# Patient Record
Sex: Male | Born: 1985 | Race: Black or African American | Hispanic: No | Marital: Single | Smoking: Current every day smoker
Health system: Southern US, Community
[De-identification: ages and names within clinical notes are randomized; demographics above are authoritative.]

## PROBLEM LIST (undated history)

## (undated) ENCOUNTER — Ambulatory Visit (HOSPITAL_COMMUNITY): Payer: Federal, State, Local not specified - Other

## (undated) DIAGNOSIS — K029 Dental caries, unspecified: Secondary | ICD-10-CM

## (undated) DIAGNOSIS — F209 Schizophrenia, unspecified: Secondary | ICD-10-CM

## (undated) NOTE — *Deleted (*Deleted)
Date:  05/08/2020 Time:  0900-1045 Group Topic/Focus: PROGRESSIVE RELAXATION. A group where deep breathing is taught and tensing and relaxation muscle groups is used. Imagery is used as well.  Pts are asked to imagine 3 pillars that hold them up when they are not able to hold themselves up.  Participation Level:  Active  Participation Quality:  Appropriate  Affect:  Appropriate  Cognitive:  Oriented  Insight: Improving  Engagement in Group:  Engaged  Modes of Intervention:  Activity, Discussion, Education, and Support  Additional Comments:  ***  Zayquan Bogard A 05/08/2020`  

---
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
## 2005-08-11 ENCOUNTER — Emergency Department (HOSPITAL_COMMUNITY): Admission: EM | Admit: 2005-08-11 | Discharge: 2005-08-11 | Payer: Self-pay | Admitting: Family Medicine

## 2005-08-14 ENCOUNTER — Emergency Department (HOSPITAL_COMMUNITY): Admission: EM | Admit: 2005-08-14 | Discharge: 2005-08-14 | Payer: Self-pay | Admitting: Family Medicine

## 2005-08-28 ENCOUNTER — Emergency Department (HOSPITAL_COMMUNITY): Admission: EM | Admit: 2005-08-28 | Discharge: 2005-08-28 | Payer: Self-pay | Admitting: Emergency Medicine

## 2005-08-29 ENCOUNTER — Emergency Department (HOSPITAL_COMMUNITY): Admission: EM | Admit: 2005-08-29 | Discharge: 2005-08-29 | Payer: Self-pay | Admitting: Emergency Medicine

## 2005-08-29 ENCOUNTER — Emergency Department (HOSPITAL_COMMUNITY): Admission: EM | Admit: 2005-08-29 | Discharge: 2005-08-29 | Payer: Self-pay | Admitting: Pediatrics

## 2010-07-08 ENCOUNTER — Emergency Department (HOSPITAL_COMMUNITY)
Admission: EM | Admit: 2010-07-08 | Discharge: 2010-07-08 | Payer: Self-pay | Source: Home / Self Care | Admitting: Emergency Medicine

## 2010-07-10 LAB — RPR: RPR Ser Ql: NONREACTIVE

## 2010-07-12 LAB — GC/CHLAMYDIA PROBE AMP, GENITAL
Chlamydia, DNA Probe: POSITIVE — AB
GC Probe Amp, Genital: NEGATIVE

## 2010-08-04 ENCOUNTER — Emergency Department (HOSPITAL_COMMUNITY)
Admission: EM | Admit: 2010-08-04 | Discharge: 2010-08-04 | Disposition: A | Discharge status: Home / Self Care | Payer: Self-pay | Attending: Emergency Medicine | Admitting: Emergency Medicine

## 2010-08-04 DIAGNOSIS — Z202 Contact with and (suspected) exposure to infections with a predominantly sexual mode of transmission: Secondary | ICD-10-CM | POA: Insufficient documentation

## 2010-08-04 LAB — URINALYSIS, ROUTINE W REFLEX MICROSCOPIC
Bilirubin Urine: NEGATIVE
Hgb urine dipstick: NEGATIVE
Ketones, ur: 15 mg/dL — AB
Nitrite: NEGATIVE
Protein, ur: NEGATIVE mg/dL
Specific Gravity, Urine: 1.033 — ABNORMAL HIGH (ref 1.005–1.030)
Urine Glucose, Fasting: NEGATIVE mg/dL
Urobilinogen, UA: 0.2 mg/dL (ref 0.0–1.0)
pH: 6 (ref 5.0–8.0)

## 2010-08-04 LAB — URINE MICROSCOPIC-ADD ON

## 2010-08-04 LAB — RPR: RPR Ser Ql: NONREACTIVE

## 2010-08-06 LAB — URINE CULTURE
Colony Count: NO GROWTH
Culture  Setup Time: 201202110029
Culture: NO GROWTH

## 2010-08-08 LAB — GC/CHLAMYDIA PROBE AMP, GENITAL
Chlamydia, DNA Probe: NEGATIVE
GC Probe Amp, Genital: NEGATIVE

## 2011-03-25 ENCOUNTER — Emergency Department (HOSPITAL_COMMUNITY)
Admission: EM | Admit: 2011-03-25 | Discharge: 2011-03-25 | Disposition: A | Discharge status: Home / Self Care | Payer: Self-pay | Attending: Emergency Medicine | Admitting: Emergency Medicine

## 2011-03-25 ENCOUNTER — Emergency Department (HOSPITAL_COMMUNITY): Payer: Self-pay

## 2011-03-25 DIAGNOSIS — W11XXXA Fall on and from ladder, initial encounter: Secondary | ICD-10-CM | POA: Insufficient documentation

## 2011-03-25 DIAGNOSIS — S8990XA Unspecified injury of unspecified lower leg, initial encounter: Secondary | ICD-10-CM | POA: Insufficient documentation

## 2011-03-25 DIAGNOSIS — M25569 Pain in unspecified knee: Secondary | ICD-10-CM | POA: Insufficient documentation

## 2011-03-25 DIAGNOSIS — IMO0002 Reserved for concepts with insufficient information to code with codable children: Secondary | ICD-10-CM | POA: Insufficient documentation

## 2011-07-08 ENCOUNTER — Encounter (HOSPITAL_COMMUNITY): Payer: Self-pay

## 2011-07-08 ENCOUNTER — Emergency Department (HOSPITAL_COMMUNITY): Payer: Self-pay

## 2011-07-08 ENCOUNTER — Emergency Department (HOSPITAL_COMMUNITY)
Admission: EM | Admit: 2011-07-08 | Discharge: 2011-07-08 | Disposition: A | Discharge status: Home / Self Care | Payer: Self-pay | Attending: Emergency Medicine | Admitting: Emergency Medicine

## 2011-07-08 DIAGNOSIS — M79609 Pain in unspecified limb: Secondary | ICD-10-CM | POA: Insufficient documentation

## 2011-07-08 DIAGNOSIS — M25469 Effusion, unspecified knee: Secondary | ICD-10-CM | POA: Insufficient documentation

## 2011-07-08 DIAGNOSIS — W010XXA Fall on same level from slipping, tripping and stumbling without subsequent striking against object, initial encounter: Secondary | ICD-10-CM | POA: Insufficient documentation

## 2011-07-08 DIAGNOSIS — S8000XA Contusion of unspecified knee, initial encounter: Secondary | ICD-10-CM | POA: Insufficient documentation

## 2011-07-08 DIAGNOSIS — S8002XA Contusion of left knee, initial encounter: Secondary | ICD-10-CM

## 2011-07-08 DIAGNOSIS — M25569 Pain in unspecified knee: Secondary | ICD-10-CM | POA: Insufficient documentation

## 2011-07-08 DIAGNOSIS — F17200 Nicotine dependence, unspecified, uncomplicated: Secondary | ICD-10-CM | POA: Insufficient documentation

## 2011-07-08 DIAGNOSIS — Y9367 Activity, basketball: Secondary | ICD-10-CM | POA: Insufficient documentation

## 2011-07-08 MED ORDER — IBUPROFEN 800 MG PO TABS: 800.0000 mg | ORAL_TABLET | Freq: Three times a day (TID) | ORAL | Status: AC

## 2011-07-08 MED ORDER — ACETAMINOPHEN 325 MG PO TABS
650.0000 mg | ORAL_TABLET | Freq: Once | ORAL | Status: AC
  Administered 2011-07-08: 650 mg via ORAL
  Filled 2011-07-08: qty 2

## 2011-07-08 MED ORDER — IBUPROFEN 800 MG PO TABS
800.0000 mg | ORAL_TABLET | Freq: Once | ORAL | Status: AC
  Administered 2011-07-08: 800 mg via ORAL
  Filled 2011-07-08: qty 1

## 2011-07-08 NOTE — Progress Notes (Signed)
Orthopedic Tech Progress Note Patient Details:  Anthony Wiggins 03-Dec-1985 [id number]  Other Ortho Devices Type of Ortho Device: Knee Sleeve Ortho Device Interventions: Ordered   Shawnie Pons 07/08/2011, 2:22 PM

## 2011-07-08 NOTE — ED Provider Notes (Signed)
History     CSN: [id number]  Arrival date & time 07/08/11  1152   First MD Initiated Contact with Patient 07/08/11 1218      Chief Complaint  Patient presents with  . Knee Pain    (Consider location/radiation/quality/duration/timing/severity/associated sxs/prior treatment) HPI... while playing basketball yesterday, tripped and fell on his left knee. Palpation makes it worse. No other injuries. Pain is moderate. No other associated symptoms.  History reviewed. No pertinent past medical history.  History reviewed. No pertinent past surgical history.  History reviewed. No pertinent family history.  History  Substance Use Topics  . Smoking status: Current Everyday Smoker -- 0.5 packs/day  . Smokeless tobacco: Not on file  . Alcohol Use: Yes     occ      Review of Systems  All other systems reviewed and are negative.    Allergies  Review of patient's allergies indicates no known allergies.  Home Medications   Current Outpatient Rx  Name Route Sig Dispense Refill  . IBUPROFEN 200 MG PO TABS Oral Take 400 mg by mouth every 8 (eight) hours as needed. For pain.    . IBUPROFEN 800 MG PO TABS Oral Take 1 tablet (800 mg total) by mouth 3 (three) times daily. 21 tablet 0    BP 130/70  Pulse 74  Temp(Src) 98.1 F (36.7 C) (Oral)  SpO2 97%  Physical Exam  Nursing note and vitals reviewed. Constitutional: He is oriented to person, place, and time. He appears well-developed and well-nourished.  HENT:  Head: Normocephalic.  Musculoskeletal:       Left knee: Tender proximal anterior tibia and inferior knee joint.  Pain with flexion and extension. Minimal edema  Neurological: He is alert and oriented to person, place, and time.  Skin: Skin is warm and dry.  Psychiatric: He has a normal mood and affect.    ED Course  Procedures (including critical care time)  Labs Reviewed - No data to display Dg Knee Complete 4 Views Left  07/08/2011  *RADIOLOGY REPORT*  Clinical  Data: Fall  LEFT KNEE - COMPLETE 4+ VIEW  Comparison: 03/25/2011  Findings: No acute fracture and no dislocation.  Unremarkable soft tissues.  IMPRESSION: No acute bony pathology.  Original Report Authenticated By: Donavan Burnet, M.D.     1. Contusion of left knee       MDM  X-ray shows no fracture. Ice, elevate, knee brace, ibuprofen, referral to orthopedics        Donnetta Hutching, MD 07/08/11 1418

## 2011-07-08 NOTE — ED Notes (Signed)
Pt playing basketball, landed on left knee, shooting pains with walking, swelling

## 2011-12-24 ENCOUNTER — Emergency Department (HOSPITAL_COMMUNITY)
Admission: EM | Admit: 2011-12-24 | Discharge: 2011-12-24 | Disposition: A | Discharge status: Home / Self Care | Payer: Self-pay | Attending: Emergency Medicine | Admitting: Emergency Medicine

## 2011-12-24 ENCOUNTER — Encounter (HOSPITAL_COMMUNITY): Payer: Self-pay | Admitting: Emergency Medicine

## 2011-12-24 DIAGNOSIS — K089 Disorder of teeth and supporting structures, unspecified: Secondary | ICD-10-CM | POA: Insufficient documentation

## 2011-12-24 DIAGNOSIS — K029 Dental caries, unspecified: Secondary | ICD-10-CM | POA: Insufficient documentation

## 2011-12-24 DIAGNOSIS — K0889 Other specified disorders of teeth and supporting structures: Secondary | ICD-10-CM

## 2011-12-24 MED ORDER — PENICILLIN V POTASSIUM 500 MG PO TABS: 500.0000 mg | ORAL_TABLET | Freq: Four times a day (QID) | ORAL | Status: AC

## 2011-12-24 MED ORDER — HYDROCODONE-ACETAMINOPHEN 5-325 MG PO TABS: 1.0000 | ORAL_TABLET | ORAL | Status: AC | PRN

## 2011-12-24 NOTE — ED Provider Notes (Signed)
History    This chart was scribed for Flint Melter, MD, MD by Smitty Pluck. The patient was seen in room TR02C and the patient's care was started at 4:48PM.   CSN: [id number]  Arrival date & time 12/24/11  1521   None     Chief Complaint  Patient presents with  . Dental Pain    (Consider location/radiation/quality/duration/timing/severity/associated sxs/prior treatment) The history is provided by the patient.   Anthony Wiggins is a 26 y.o. male who presents to the Emergency Department complaining of moderate right lower onset 3 days. Pt reports that there is pain with opening his mouth. He reports having mild headache. Pt reports that tooth is cracked. Symptoms have been constant since onset. There is no radiation of pain. Denies any other pain.   History reviewed. No pertinent past medical history.  History reviewed. No pertinent past surgical history.  History reviewed. No pertinent family history.  History  Substance Use Topics  . Smoking status: Current Everyday Smoker -- 0.5 packs/day  . Smokeless tobacco: Not on file  . Alcohol Use: 2.4 oz/week    4 Cans of beer per week     occasion      Review of Systems  Constitutional: Negative for fever and chills.  All other systems reviewed and are negative.    Allergies  Review of patient's allergies indicates no known allergies.  Home Medications   Current Outpatient Rx  Name Route Sig Dispense Refill  . IBUPROFEN 200 MG PO TABS Oral Take 400 mg by mouth every 8 (eight) hours as needed. For pain.    . IBUPROFEN 800 MG PO TABS Oral Take 800 mg by mouth every 8 (eight) hours as needed. For dental pain    . HYDROCODONE-ACETAMINOPHEN 5-325 MG PO TABS Oral Take 1 tablet by mouth every 4 (four) hours as needed for pain. 20 tablet 0  . PENICILLIN V POTASSIUM 500 MG PO TABS Oral Take 1 tablet (500 mg total) by mouth 4 (four) times daily. 40 tablet 0    BP 124/80  Pulse 76  Temp 98.4 F (36.9 C) (Oral)  Resp 20  SpO2  100%  Physical Exam  Nursing note and vitals reviewed. Constitutional: He is oriented to person, place, and time. He appears well-developed and well-nourished. No distress.  HENT:  Head: Normocephalic and atraumatic.       Right lower premolar has large cavity  No trismus  No swelling or jaw or submandibular tenderness   Eyes: Conjunctivae are normal. Pupils are equal, round, and reactive to light.  Neck: Normal range of motion. Neck supple.  Pulmonary/Chest: Effort normal. No respiratory distress.  Abdominal: Soft.  Neurological: He is alert and oriented to person, place, and time.  Skin: Skin is warm and dry.  Psychiatric: He has a normal mood and affect. His behavior is normal.    ED Course  Procedures (including critical care time) DIAGNOSTIC STUDIES: Oxygen Saturation is 100% on room air, normal by my interpretation.    COORDINATION OF CARE: 4:53PM EDP discusses pt ED treatment with pt.    Labs Reviewed - No data to display No results found.   1. Toothache       MDM  Dental pain with non-viable tooth, doubt abscess. Doubt metabolic instability, serious bacterial infection or impending vascular collapse; the patient is stable for discharge.  Plan: Home Medications- PCN, Norco; Home Treatments- soft food; Recommended follow up- Dental f/u asap for extraction   I personally performed the  services described in this documentation, which was scribed in my presence. The recorded information has been reviewed and considered.        Flint Melter, MD 12/25/11 1044

## 2011-12-24 NOTE — ED Notes (Signed)
Pt reports R lower dental pain for 3 days; pt has cracked tooth; pt denies fevers chills; reports headaches

## 2011-12-24 NOTE — Discharge Instructions (Signed)
Dental Pain  A tooth ache may be caused by cavities (tooth decay). Cavities expose the nerve of the tooth to air and hot or cold temperatures. It may come from an infection or abscess (also called a boil or furuncle) around your tooth. It is also often caused by dental caries (tooth decay). This causes the pain you are having.  DIAGNOSIS   Your caregiver can diagnose this problem by exam.  TREATMENT   · If caused by an infection, it may be treated with medications which kill germs (antibiotics) and pain medications as prescribed by your caregiver. Take medications as directed.  · Only take over-the-counter or prescription medicines for pain, discomfort, or fever as directed by your caregiver.  · Whether the tooth ache today is caused by infection or dental disease, you should see your dentist as soon as possible for further care.  SEEK MEDICAL CARE IF:  The exam and treatment you received today has been provided on an emergency basis only. This is not a substitute for complete medical or dental care. If your problem worsens or new problems (symptoms) appear, and you are unable to meet with your dentist, call or return to this location.  SEEK IMMEDIATE MEDICAL CARE IF:   · You have a fever.  · You develop redness and swelling of your face, jaw, or neck.  · You are unable to open your mouth.  · You have severe pain uncontrolled by pain medicine.  MAKE SURE YOU:   · Understand these instructions.  · Will watch your condition.  · Will get help right away if you are not doing well or get worse.  Document Released: 06/11/2005 Document Revised: 05/31/2011 Document Reviewed: 01/28/2008  [employer]® Patient Information ©2012 [employer].

## 2012-05-07 ENCOUNTER — Encounter (HOSPITAL_COMMUNITY): Payer: Self-pay | Admitting: Emergency Medicine

## 2012-05-07 ENCOUNTER — Emergency Department (HOSPITAL_COMMUNITY)
Admission: EM | Admit: 2012-05-07 | Discharge: 2012-05-07 | Disposition: A | Discharge status: Home / Self Care | Payer: Self-pay | Attending: Emergency Medicine | Admitting: Emergency Medicine

## 2012-05-07 DIAGNOSIS — F17200 Nicotine dependence, unspecified, uncomplicated: Secondary | ICD-10-CM | POA: Insufficient documentation

## 2012-05-07 DIAGNOSIS — K029 Dental caries, unspecified: Secondary | ICD-10-CM | POA: Insufficient documentation

## 2012-05-07 MED ORDER — PENICILLIN V POTASSIUM 250 MG PO TABS
500.0000 mg | ORAL_TABLET | Freq: Once | ORAL | Status: AC
  Administered 2012-05-07: 500 mg via ORAL
  Filled 2012-05-07: qty 2

## 2012-05-07 MED ORDER — PENICILLIN V POTASSIUM 500 MG PO TABS: 500.0000 mg | ORAL_TABLET | Freq: Three times a day (TID) | ORAL | Status: DC

## 2012-05-07 MED ORDER — HYDROCODONE-ACETAMINOPHEN 5-325 MG PO TABS
2.0000 | ORAL_TABLET | Freq: Once | ORAL | Status: AC
  Administered 2012-05-07: 2 via ORAL
  Filled 2012-05-07: qty 2

## 2012-05-07 MED ORDER — HYDROCODONE-ACETAMINOPHEN 5-500 MG PO TABS: 1.0000 | ORAL_TABLET | Freq: Four times a day (QID) | ORAL | Status: DC | PRN

## 2012-05-07 NOTE — ED Provider Notes (Signed)
History  This chart was scribed for Doug Sou, MD by Shari Heritage, ED Scribe. The patient was seen in room TR06C/TR06C. Patient's care was started at 1844.  CSN: [id number]  Arrival date & time 05/07/12  1816   First MD Initiated Contact with Patient 05/07/12 1844      Chief Complaint  Patient presents with  . Dental Pain   The history is provided by the patient. No language interpreter was used.    HPI Comments: ALONTE WULFF is a 26 y.o. male who presents to the Emergency Department complaining of constant, moderate to severe, non-radiating, dull right lower dental pain onset 2 days ago. Patient states that pain is worse when he chews. Patient states that he broke a tooth several weeks ago, but hasn't seen a dentist for treatment of this issue and hasn't been to a dentist for several years. Patient has taken Tylenol and Ibuprofen for pain with minimal relief. He reports no other significant past medical, surgical or family history. He has no known allergies. He smokes cigarettes and drinks alcohol. He denies any illegal drug use. He denies any other symptoms or complaints at this time.  History  Substance Use Topics  . Smoking status: Current Every Day Smoker -- 0.5 packs/day  . Smokeless tobacco: Not on file  . Alcohol Use: 2.4 oz/week    4 Cans of beer per week     Comment: occasion     Review of Systems  Constitutional: Negative.   HENT: Positive for dental problem.   Respiratory: Negative.   Cardiovascular: Negative.   Gastrointestinal: Negative.   Musculoskeletal: Negative.   Skin: Negative.   Neurological: Negative.   Hematological: Negative.   Psychiatric/Behavioral: Negative.     Allergies  Review of patient's allergies indicates no known allergies.  Home Medications   Current Outpatient Rx  Name  Route  Sig  Dispense  Refill  . IBUPROFEN 200 MG PO TABS   Oral   Take 400 mg by mouth every 8 (eight) hours as needed. For pain.         . IBUPROFEN 800  MG PO TABS   Oral   Take 800 mg by mouth every 8 (eight) hours as needed. For dental pain           Triage Vitals: BP 137/78  Pulse 84  Temp 98 F (36.7 C) (Oral)  Resp 18  SpO2 99%  Physical Exam  Nursing note and vitals reviewed. Constitutional: He is oriented to person, place, and time. He appears well-developed and well-nourished. No distress.  HENT:  Head: Normocephalic and atraumatic.  Mouth/Throat: Dental caries present.       Right lower 1st premolar is badly decayed& fractured. No swelling of the gingiva Multiple dental caries. No trismus  Eyes: EOM are normal. Pupils are equal, round, and reactive to light.  Neck: Neck supple. No tracheal deviation present.  Cardiovascular: Normal rate.   Pulmonary/Chest: Effort normal. No respiratory distress.  Abdominal: Soft. He exhibits no distension.  Musculoskeletal: Normal range of motion. He exhibits no edema.  Lymphadenopathy:    He has no cervical adenopathy.  Neurological: He is alert and oriented to person, place, and time. No sensory deficit.  Skin: Skin is warm and dry.  Psychiatric: He has a normal mood and affect. His behavior is normal.    ED Course  Procedures (including critical care time) DIAGNOSTIC STUDIES: Oxygen Saturation is 99% on room air, normal by my interpretation.    COORDINATION OF  CARE: 6:47 PM- Patient informed of current plan for treatment and evaluation and agrees with plan at this time.   No diagnosis found.    MDM  Plan prescription Pen-Vee K, Vicodin, dental referral Diagnosis dental caries       I personally performed the services described in this documentation, which was scribed in my presence. The recorded information has been reviewed and is accurate.    Doug Sou, MD 05/07/12 1902

## 2012-05-07 NOTE — ED Notes (Signed)
Pt c/o right lower dental pain from broken tooth

## 2012-06-13 ENCOUNTER — Encounter (HOSPITAL_COMMUNITY): Payer: Self-pay

## 2012-06-13 ENCOUNTER — Emergency Department (HOSPITAL_COMMUNITY)
Admission: EM | Admit: 2012-06-13 | Discharge: 2012-06-13 | Disposition: A | Discharge status: Home / Self Care | Payer: Self-pay | Attending: Emergency Medicine | Admitting: Emergency Medicine

## 2012-06-13 DIAGNOSIS — K0490 Unspecified diseases of pulp and periapical tissues: Secondary | ICD-10-CM | POA: Insufficient documentation

## 2012-06-13 DIAGNOSIS — R51 Headache: Secondary | ICD-10-CM | POA: Insufficient documentation

## 2012-06-13 DIAGNOSIS — F17200 Nicotine dependence, unspecified, uncomplicated: Secondary | ICD-10-CM | POA: Insufficient documentation

## 2012-06-13 HISTORY — DX: Dental caries, unspecified: K02.9

## 2012-06-13 MED ORDER — HYDROCODONE-ACETAMINOPHEN 5-325 MG PO TABS: 2.0000 | ORAL_TABLET | ORAL | Status: DC | PRN

## 2012-06-13 MED ORDER — IBUPROFEN 600 MG PO TABS: 600.0000 mg | ORAL_TABLET | Freq: Four times a day (QID) | ORAL | Status: DC | PRN

## 2012-06-13 MED ORDER — IBUPROFEN 400 MG PO TABS
600.0000 mg | ORAL_TABLET | Freq: Once | ORAL | Status: AC
  Administered 2012-06-13: 600 mg via ORAL
  Filled 2012-06-13: qty 1

## 2012-06-13 MED ORDER — PENICILLIN V POTASSIUM 500 MG PO TABS: 500.0000 mg | ORAL_TABLET | Freq: Four times a day (QID) | ORAL | Status: AC

## 2012-06-13 NOTE — ED Notes (Signed)
Pt reports (R) bottom molar pain for 2 months, pt has an appt 06/19/2012 but reports increase pain

## 2012-06-13 NOTE — ED Provider Notes (Signed)
History   This chart was scribed for Anthony Kaplan, MD by Charolett Bumpers, ED Scribe. The patient was seen in room TR06C/TR06C. Patient's care was started at 1816.   CSN: [id number]  Arrival date & time 06/13/12  1651   First MD Initiated Contact with Patient 06/13/12 1816      Chief Complaint  Patient presents with  . Dental Pain    The history is provided by the patient. No language interpreter was used.  Anthony Wiggins is a 26 y.o. male who presents to the Emergency Department complaining of constant, severe right lower dental pain. He states he has had trouble with the tooth for the past 2 months. He states that he has been seen a month ago and given antibiotics. He reports his dental pain improved but recently gradually returned. He has an appointment on 06/19/12 to get the tooth extracted. He reports an associated headache. He denies any fever, chills, nausea vomiting. He denies any drainage or pus noted. He reports sensitivity to hot and cold foods.   Past Medical History  Diagnosis Date  . Dental caries     History reviewed. No pertinent past surgical history.  History reviewed. No pertinent family history.  History  Substance Use Topics  . Smoking status: Current Every Day Smoker -- 0.5 packs/day  . Smokeless tobacco: Not on file  . Alcohol Use: 2.4 oz/week    4 Cans of beer per week     Comment: occasion      Review of Systems  Constitutional: Negative for fever and chills.  HENT: Positive for dental problem.   Gastrointestinal: Negative for nausea and vomiting.  Neurological: Positive for headaches.  All other systems reviewed and are negative.    Allergies  Review of patient's allergies indicates no known allergies.  Home Medications   Current Outpatient Rx  Name  Route  Sig  Dispense  Refill  . IBUPROFEN 200 MG PO TABS   Oral   Take 400 mg by mouth every 8 (eight) hours as needed. For pain.           BP 114/86  Pulse 72  Temp 98.4  F (36.9 C) (Oral)  Resp 14  SpO2 96%  Physical Exam  Nursing note and vitals reviewed. Constitutional: He is oriented to person, place, and time. He appears well-developed and well-nourished. No distress.  HENT:  Head: Normocephalic and atraumatic.  Mouth/Throat: Oropharynx is clear and moist.       Tooth 28 is clipped and decayed. Exposure of the pulp noted. Gingival tenderness but no fluctuance noted.   Eyes: EOM are normal.  Neck: Neck supple. No tracheal deviation present.  Cardiovascular: Normal rate, regular rhythm and normal heart sounds.   No murmur heard. Pulmonary/Chest: Effort normal and breath sounds normal. No respiratory distress.       Lungs clear to auscultation.    Abdominal: Soft. There is no tenderness.  Musculoskeletal: Normal range of motion.  Neurological: He is alert and oriented to person, place, and time.  Skin: Skin is warm and dry.  Psychiatric: He has a normal mood and affect. His behavior is normal.    ED Course  Procedures (including critical care time)  DIAGNOSTIC STUDIES: Oxygen Saturation is 96% on room air, adequate by my interpretation.    COORDINATION OF CARE:  18:32-Discussed planned course of treatment with the patient including pain medication, antibiotics and f/u with dentist who is agreeable at this time.    Labs Reviewed -  No data to display No results found.   No diagnosis found.    MDM  I personally performed the services described in this documentation, which was scribed in my presence. The recorded information has been reviewed and is accurate.  Pt has dental pain, poor dentition. No abscess on exam. No trismus. Has dentist appt on 12/22 for the tooth that is eroded, and likely needs to be pulled out.       Anthony Kaplan, MD 06/13/12 1850

## 2012-07-10 ENCOUNTER — Encounter (HOSPITAL_COMMUNITY): Payer: Self-pay

## 2012-07-10 ENCOUNTER — Emergency Department (HOSPITAL_COMMUNITY)
Admission: EM | Admit: 2012-07-10 | Discharge: 2012-07-10 | Disposition: A | Discharge status: Home / Self Care | Payer: No Typology Code available for payment source | Attending: Emergency Medicine | Admitting: Emergency Medicine

## 2012-07-10 DIAGNOSIS — IMO0002 Reserved for concepts with insufficient information to code with codable children: Secondary | ICD-10-CM | POA: Insufficient documentation

## 2012-07-10 DIAGNOSIS — F17200 Nicotine dependence, unspecified, uncomplicated: Secondary | ICD-10-CM | POA: Insufficient documentation

## 2012-07-10 DIAGNOSIS — S139XXA Sprain of joints and ligaments of unspecified parts of neck, initial encounter: Secondary | ICD-10-CM | POA: Insufficient documentation

## 2012-07-10 DIAGNOSIS — S8000XA Contusion of unspecified knee, initial encounter: Secondary | ICD-10-CM | POA: Insufficient documentation

## 2012-07-10 DIAGNOSIS — S8002XA Contusion of left knee, initial encounter: Secondary | ICD-10-CM

## 2012-07-10 DIAGNOSIS — Y9389 Activity, other specified: Secondary | ICD-10-CM | POA: Insufficient documentation

## 2012-07-10 DIAGNOSIS — Z8719 Personal history of other diseases of the digestive system: Secondary | ICD-10-CM | POA: Insufficient documentation

## 2012-07-10 DIAGNOSIS — Y92410 Unspecified street and highway as the place of occurrence of the external cause: Secondary | ICD-10-CM | POA: Insufficient documentation

## 2012-07-10 MED ORDER — CYCLOBENZAPRINE HCL 10 MG PO TABS: 10.0000 mg | ORAL_TABLET | Freq: Two times a day (BID) | ORAL | Status: DC | PRN

## 2012-07-10 MED ORDER — IBUPROFEN 600 MG PO TABS: 600.0000 mg | ORAL_TABLET | Freq: Four times a day (QID) | ORAL | Status: DC | PRN

## 2012-07-10 MED ORDER — OXYCODONE-ACETAMINOPHEN 5-325 MG PO TABS
1.0000 | ORAL_TABLET | Freq: Once | ORAL | Status: AC
  Administered 2012-07-10: 1 via ORAL
  Filled 2012-07-10: qty 1
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
## 2012-07-10 NOTE — ED Notes (Signed)
Patient is alert and oriented x4.  Patient was explained discharge instructions and he understood them.  His friend, Rubie Maid is here to transport the patient home.
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
## 2012-07-11 ENCOUNTER — Encounter (HOSPITAL_COMMUNITY): Payer: Self-pay

## 2012-07-11 ENCOUNTER — Emergency Department (HOSPITAL_COMMUNITY): Payer: No Typology Code available for payment source

## 2012-07-11 ENCOUNTER — Emergency Department (HOSPITAL_COMMUNITY)
Admission: EM | Admit: 2012-07-11 | Discharge: 2012-07-11 | Disposition: A | Discharge status: Home / Self Care | Payer: No Typology Code available for payment source | Attending: Emergency Medicine | Admitting: Emergency Medicine

## 2012-07-11 DIAGNOSIS — M545 Low back pain, unspecified: Secondary | ICD-10-CM | POA: Insufficient documentation

## 2012-07-11 DIAGNOSIS — S139XXA Sprain of joints and ligaments of unspecified parts of neck, initial encounter: Secondary | ICD-10-CM | POA: Insufficient documentation

## 2012-07-11 DIAGNOSIS — Z8719 Personal history of other diseases of the digestive system: Secondary | ICD-10-CM | POA: Insufficient documentation

## 2012-07-11 DIAGNOSIS — Y929 Unspecified place or not applicable: Secondary | ICD-10-CM | POA: Insufficient documentation

## 2012-07-11 DIAGNOSIS — M5380 Other specified dorsopathies, site unspecified: Secondary | ICD-10-CM | POA: Insufficient documentation

## 2012-07-11 DIAGNOSIS — M62830 Muscle spasm of back: Secondary | ICD-10-CM

## 2012-07-11 DIAGNOSIS — Y9389 Activity, other specified: Secondary | ICD-10-CM | POA: Insufficient documentation

## 2012-07-11 DIAGNOSIS — S335XXA Sprain of ligaments of lumbar spine, initial encounter: Secondary | ICD-10-CM | POA: Insufficient documentation

## 2012-07-11 DIAGNOSIS — F17200 Nicotine dependence, unspecified, uncomplicated: Secondary | ICD-10-CM | POA: Insufficient documentation

## 2012-07-11 DIAGNOSIS — S161XXA Strain of muscle, fascia and tendon at neck level, initial encounter: Secondary | ICD-10-CM

## 2012-07-11 DIAGNOSIS — S39012A Strain of muscle, fascia and tendon of lower back, initial encounter: Secondary | ICD-10-CM

## 2012-07-11 MED ORDER — METHOCARBAMOL 750 MG PO TABS: 750.0000 mg | ORAL_TABLET | Freq: Four times a day (QID) | ORAL | Status: DC | PRN

## 2012-07-11 MED ORDER — HYDROCODONE-ACETAMINOPHEN 5-325 MG PO TABS: 1.0000 | ORAL_TABLET | Freq: Four times a day (QID) | ORAL | Status: DC | PRN

## 2012-07-11 MED ORDER — DIAZEPAM 5 MG PO TABS
5.0000 mg | ORAL_TABLET | Freq: Once | ORAL | Status: AC
  Administered 2012-07-11: 5 mg via ORAL
  Filled 2012-07-11: qty 1

## 2012-07-11 MED ORDER — HYDROCODONE-ACETAMINOPHEN 5-325 MG PO TABS
2.0000 | ORAL_TABLET | Freq: Once | ORAL | Status: AC
  Administered 2012-07-11: 2 via ORAL
  Filled 2012-07-11 (×2): qty 2
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
## 2012-07-11 NOTE — ED Provider Notes (Signed)
Medical screening examination/treatment/procedure(s) were performed by non-physician practitioner and as supervising physician I was immediately available for consultation/collaboration.   Kyndel Egger, MD 07/11/12 0015 
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
## 2012-07-12 NOTE — ED Provider Notes (Signed)
Medical screening examination/treatment/procedure(s) were performed by non-physician practitioner and as supervising physician I was immediately available for consultation/collaboration.   Ceriah Kohler III, MD 07/12/12 1205 

## 2012-07-18 ENCOUNTER — Emergency Department (HOSPITAL_COMMUNITY)
Admission: EM | Admit: 2012-07-18 | Discharge: 2012-07-18 | Disposition: A | Discharge status: Home / Self Care | Payer: No Typology Code available for payment source | Attending: Emergency Medicine | Admitting: Emergency Medicine

## 2012-07-18 ENCOUNTER — Encounter (HOSPITAL_COMMUNITY): Payer: Self-pay | Admitting: *Deleted

## 2012-07-18 DIAGNOSIS — M545 Low back pain: Secondary | ICD-10-CM

## 2012-07-18 DIAGNOSIS — Y939 Activity, unspecified: Secondary | ICD-10-CM | POA: Insufficient documentation

## 2012-07-18 DIAGNOSIS — Y92410 Unspecified street and highway as the place of occurrence of the external cause: Secondary | ICD-10-CM | POA: Insufficient documentation

## 2012-07-18 DIAGNOSIS — IMO0002 Reserved for concepts with insufficient information to code with codable children: Secondary | ICD-10-CM | POA: Insufficient documentation

## 2012-07-18 DIAGNOSIS — F17200 Nicotine dependence, unspecified, uncomplicated: Secondary | ICD-10-CM | POA: Insufficient documentation

## 2012-07-18 MED ORDER — KETOROLAC TROMETHAMINE 60 MG/2ML IM SOLN
60.0000 mg | Freq: Once | INTRAMUSCULAR | Status: AC
  Administered 2012-07-18: 60 mg via INTRAMUSCULAR
  Filled 2012-07-18: qty 2

## 2012-07-18 MED ORDER — TRAMADOL HCL 50 MG PO TABS: 50.0000 mg | ORAL_TABLET | Freq: Four times a day (QID) | ORAL | Status: DC | PRN
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
## 2012-07-18 NOTE — ED Notes (Signed)
Discharge instructions reviewed. Pt verbalized understanding.  
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
## 2012-07-19 NOTE — ED Provider Notes (Signed)
Medical screening examination/treatment/procedure(s) were performed by non-physician practitioner and as supervising physician I was immediately available for consultation/collaboration.  Delores Thelen, MD 07/19/12 0019 

## 2012-07-29 ENCOUNTER — Emergency Department (HOSPITAL_COMMUNITY)
Admission: EM | Admit: 2012-07-29 | Discharge: 2012-07-29 | Disposition: A | Discharge status: Home / Self Care | Payer: Self-pay | Attending: Emergency Medicine | Admitting: Emergency Medicine

## 2012-07-29 ENCOUNTER — Emergency Department (HOSPITAL_COMMUNITY): Payer: Self-pay

## 2012-07-29 ENCOUNTER — Encounter (HOSPITAL_COMMUNITY): Payer: Self-pay | Admitting: *Deleted

## 2012-07-29 DIAGNOSIS — R05 Cough: Secondary | ICD-10-CM | POA: Insufficient documentation

## 2012-07-29 DIAGNOSIS — M2550 Pain in unspecified joint: Secondary | ICD-10-CM | POA: Insufficient documentation

## 2012-07-29 DIAGNOSIS — Z8719 Personal history of other diseases of the digestive system: Secondary | ICD-10-CM | POA: Insufficient documentation

## 2012-07-29 DIAGNOSIS — R5381 Other malaise: Secondary | ICD-10-CM | POA: Insufficient documentation

## 2012-07-29 DIAGNOSIS — J029 Acute pharyngitis, unspecified: Secondary | ICD-10-CM | POA: Insufficient documentation

## 2012-07-29 DIAGNOSIS — R6889 Other general symptoms and signs: Secondary | ICD-10-CM

## 2012-07-29 DIAGNOSIS — R1084 Generalized abdominal pain: Secondary | ICD-10-CM | POA: Insufficient documentation

## 2012-07-29 DIAGNOSIS — R51 Headache: Secondary | ICD-10-CM | POA: Insufficient documentation

## 2012-07-29 DIAGNOSIS — F17200 Nicotine dependence, unspecified, uncomplicated: Secondary | ICD-10-CM | POA: Insufficient documentation

## 2012-07-29 DIAGNOSIS — IMO0001 Reserved for inherently not codable concepts without codable children: Secondary | ICD-10-CM | POA: Insufficient documentation

## 2012-07-29 DIAGNOSIS — J069 Acute upper respiratory infection, unspecified: Secondary | ICD-10-CM | POA: Insufficient documentation

## 2012-07-29 DIAGNOSIS — R059 Cough, unspecified: Secondary | ICD-10-CM | POA: Insufficient documentation

## 2012-07-29 MED ORDER — HYDROCODONE-ACETAMINOPHEN 7.5-325 MG/15ML PO SOLN: 15.0000 mL | Freq: Four times a day (QID) | ORAL | Status: DC | PRN

## 2012-07-29 MED ORDER — ONDANSETRON HCL 4 MG PO TABS: 4.0000 mg | ORAL_TABLET | Freq: Four times a day (QID) | ORAL | Status: DC

## 2012-07-29 MED ORDER — HYDROCODONE-ACETAMINOPHEN 7.5-325 MG/15ML PO SOLN
15.0000 mL | Freq: Once | ORAL | Status: AC
  Administered 2012-07-29: 15 mL via ORAL
  Filled 2012-07-29: qty 15

## 2012-07-29 NOTE — ED Provider Notes (Signed)
Medical screening examination/treatment/procedure(s) were performed by non-physician practitioner and as supervising physician I was immediately available for consultation/collaboration.   Amaziah Raisanen, MD 07/29/12 1644 

## 2012-07-29 NOTE — ED Provider Notes (Signed)
History     CSN: [id number]  Arrival date & time 07/29/12  1319   First MD Initiated Contact with Patient 07/29/12 1406      Chief Complaint  Patient presents with  . URI    (Consider location/radiation/quality/duration/timing/severity/associated sxs/prior treatment) Patient is a 27 y.o. male presenting with URI. The history is provided by the patient. No language interpreter was used.  URI The primary symptoms include fever, fatigue, headaches, sore throat, cough, abdominal pain, myalgias and arthralgias. Primary symptoms do not include ear pain, swollen glands, wheezing, nausea, vomiting or rash. The current episode started 2 days ago. This is a new problem. The problem has not changed since onset. The fever began yesterday. The maximum temperature recorded prior to his arrival was 100 to 100.9 F. The temperature was taken by an oral thermometer.  The headache began 2 days ago. The headache developed gradually. Headache is a new problem. The headache is present intermittently. The pain from the headache is at a severity of 4/10. The headache is not associated with stiff neck.  The cough began 2 days ago. The cough is new. The cough is productive. There is nondescript sputum produced.  The abdominal pain began yesterday. The abdominal pain is generalized. The severity of the abdominal pain is 5/10.  Myalgias began 2 days ago. The myalgias have been unchanged since their onset. The myalgias are aching and throbbing.  Symptoms associated with the illness include chills.    Past Medical History  Diagnosis Date  . Dental caries     History reviewed. No pertinent past surgical history.  No family history on file.  History  Substance Use Topics  . Smoking status: Current Every Day Smoker -- 0.5 packs/day    Types: Cigarettes  . Smokeless tobacco: Not on file  . Alcohol Use: 2.4 oz/week    4 Cans of beer per week     Comment: occasion      Review of Systems  Constitutional:  Positive for fever, chills and fatigue.       A complete 10 system review of systems was obtained and all systems are negative except as noted in the HPI and PMH.  HENT: Positive for sore throat. Negative for ear pain.   Respiratory: Positive for cough. Negative for wheezing.   Gastrointestinal: Positive for abdominal pain. Negative for nausea and vomiting.  Musculoskeletal: Positive for myalgias and arthralgias.  Skin: Negative for rash.  Neurological: Positive for headaches.    Allergies  Naproxen  Home Medications   Current Outpatient Rx  Name  Route  Sig  Dispense  Refill  . DM-PHENYLEPHRINE-ACETAMINOPHEN 10-5-325 MG PO CAPS   Oral   Take 2 capsules by mouth every 6 (six) hours as needed. Cold symptoms.         . IBUPROFEN 600 MG PO TABS   Oral   Take 600 mg by mouth every 6 (six) hours as needed. For pain           BP 136/75  Pulse 87  Temp 100.1 F (37.8 C) (Oral)  Resp 18  SpO2 97%  Physical Exam  Nursing note and vitals reviewed. Constitutional: He is oriented to person, place, and time. He appears well-developed and well-nourished. No distress.       Awake, alert, nontoxic appearance  HENT:  Head: Atraumatic.  Right Ear: External ear normal.  Left Ear: External ear normal.  Mouth/Throat: Oropharynx is clear and moist. No oropharyngeal exudate.       Ear:  normal TM bilaterally  Nose: mild rhinorrhea  Throat: uvula midline, no evidence of deep tissue infection  Eyes: Conjunctivae normal are normal. Right eye exhibits no discharge. Left eye exhibits no discharge.  Neck: Normal range of motion. Neck supple.       No nuchal rigidity  Cardiovascular: Normal rate and regular rhythm.   Pulmonary/Chest: Effort normal. No respiratory distress. He exhibits no tenderness.  Abdominal: Soft. There is tenderness (mild left lower and right lower abdominal tenderness without guarding or rebound tenderness.  Negative Murphy sign, no McBurney's point.  No hernia noted.   ). There is no rebound.  Musculoskeletal: He exhibits no edema and no tenderness.       ROM appears intact, no obvious focal weakness  Lymphadenopathy:    He has no cervical adenopathy.  Neurological: He is alert and oriented to person, place, and time.  Skin: Skin is warm and dry. No rash noted.  Psychiatric: He has a normal mood and affect.    ED Course  Procedures (including critical care time)  Labs Reviewed - No data to display Dg Chest 2 View  07/29/2012  *RADIOLOGY REPORT*  Clinical Data: Fever, chest pain  CHEST - 2 VIEW  Comparison: None.  Findings: The heart size and vascular pattern are normal.  The lungs are clear.  There are no pleural effusions.  IMPRESSION: Negative chest radiograph   Original Report Authenticated By: Esperanza Heir, M.D.      No diagnosis found.  2:47 PM Pt presents wit flu-like sxs x 2 days.  Report that cough and headache along with myalgias are what bothers him the most . Hycet Elixir given for cough.  Pt had CXR today which shows no focal infiltrates indicative of pneumonia.    Pt also c/o of abdominal pain only after i asked about it.  He has decreased in appetite but no n/v/d.  Has appendix.  Abdomen is tender to RLQ and also LLQ.  No GU involvement.  Currently i have low suspicion of appendicitis. Especially in the setting of cough and URI sxs. I did explain my thought process to pt and offer further work up including CT scan to r/o appendicitis.  Pt declined.  Return precaution were given.    Plan to treat for his flu-like sxs with cough medication along with tylenol/ibuprofen.  Pt encourage to return if sxs worsen.    BP 136/75  Pulse 87  Temp 100.1 F (37.8 C) (Oral)  Resp 18  SpO2 97%  I have reviewed nursing notes and vital signs. I personally reviewed the imaging tests through PACS system  I reviewed available ER/hospitalization records thought the EMR  1. Flu-like sxs  MDM          Fayrene Helper, PA-C 07/29/12 1521

## 2012-07-29 NOTE — ED Notes (Signed)
Pt c/o cough, headache, body aches and fever x 2 days.

## 2012-08-17 ENCOUNTER — Emergency Department (HOSPITAL_COMMUNITY): Payer: Self-pay

## 2012-08-17 ENCOUNTER — Emergency Department (HOSPITAL_COMMUNITY)
Admission: EM | Admit: 2012-08-17 | Discharge: 2012-08-17 | Disposition: A | Discharge status: Home / Self Care | Payer: Self-pay | Attending: Emergency Medicine | Admitting: Emergency Medicine

## 2012-08-17 ENCOUNTER — Encounter (HOSPITAL_COMMUNITY): Payer: Self-pay | Admitting: *Deleted

## 2012-08-17 DIAGNOSIS — Z8719 Personal history of other diseases of the digestive system: Secondary | ICD-10-CM | POA: Insufficient documentation

## 2012-08-17 DIAGNOSIS — R071 Chest pain on breathing: Secondary | ICD-10-CM | POA: Insufficient documentation

## 2012-08-17 DIAGNOSIS — R51 Headache: Secondary | ICD-10-CM | POA: Insufficient documentation

## 2012-08-17 DIAGNOSIS — F17200 Nicotine dependence, unspecified, uncomplicated: Secondary | ICD-10-CM | POA: Insufficient documentation

## 2012-08-17 DIAGNOSIS — R112 Nausea with vomiting, unspecified: Secondary | ICD-10-CM | POA: Insufficient documentation

## 2012-08-17 DIAGNOSIS — R197 Diarrhea, unspecified: Secondary | ICD-10-CM | POA: Insufficient documentation

## 2012-08-17 LAB — CBC WITH DIFFERENTIAL/PLATELET
Eosinophils Relative: 6 % — ABNORMAL HIGH (ref 0–5)
HCT: 43.9 % (ref 39.0–52.0)
Lymphocytes Relative: 9 % — ABNORMAL LOW (ref 12–46)
Lymphs Abs: 0.6 10*3/uL — ABNORMAL LOW (ref 0.7–4.0)
MCV: 79 fL (ref 78.0–100.0)
Platelets: 270 10*3/uL (ref 150–400)
RBC: 5.56 MIL/uL (ref 4.22–5.81)
WBC: 6.8 10*3/uL (ref 4.0–10.5)

## 2012-08-17 LAB — BASIC METABOLIC PANEL
CO2: 24 mEq/L (ref 19–32)
Calcium: 9.7 mg/dL (ref 8.4–10.5)
Chloride: 100 mEq/L (ref 96–112)
Glucose, Bld: 101 mg/dL — ABNORMAL HIGH (ref 70–99)
Potassium: 4 mEq/L (ref 3.5–5.1)
Sodium: 136 mEq/L (ref 135–145)

## 2012-08-17 MED ORDER — ONDANSETRON 8 MG PO TBDP: 8.0000 mg | ORAL_TABLET | Freq: Three times a day (TID) | ORAL | Status: DC | PRN

## 2012-08-17 MED ORDER — SODIUM CHLORIDE 0.9 % IV SOLN
1000.0000 mL | Freq: Once | INTRAVENOUS | Status: AC
  Administered 2012-08-17: 1000 mL via INTRAVENOUS

## 2012-08-17 MED ORDER — ONDANSETRON HCL 4 MG/2ML IJ SOLN
4.0000 mg | Freq: Once | INTRAMUSCULAR | Status: AC
  Administered 2012-08-17: 4 mg via INTRAVENOUS
  Filled 2012-08-17: qty 2

## 2012-08-17 MED ORDER — ASPIRIN 81 MG PO CHEW
324.0000 mg | CHEWABLE_TABLET | Freq: Once | ORAL | Status: AC
  Administered 2012-08-17: 324 mg via ORAL
  Filled 2012-08-17: qty 4

## 2012-08-17 MED ORDER — SODIUM CHLORIDE 0.9 % IV SOLN: 1000.0000 mL | INTRAVENOUS | Status: DC

## 2012-08-17 MED ORDER — ACETAMINOPHEN 325 MG PO TABS
650.0000 mg | ORAL_TABLET | Freq: Once | ORAL | Status: AC
  Administered 2012-08-17: 650 mg via ORAL
  Filled 2012-08-17: qty 2

## 2012-08-17 NOTE — ED Notes (Signed)
Pt reports abd pain, headache, vomiting, diarrhea, cough x 2 days. Mask on pt at triage.

## 2012-08-17 NOTE — ED Provider Notes (Signed)
History    CSN: [id number] Arrival date & time 08/17/12  1203 First MD Initiated Contact with Patient 08/17/12 1216      Chief Complaint  Patient presents with  . Abdominal Pain  . Emesis  . Diarrhea     Patient is a 27 y.o. male presenting with abdominal pain, vomiting, and diarrhea. The history is provided by the patient.  Abdominal Pain Pain location:  Suprapubic Pain quality: aching, cramping and throbbing   Pain radiates to:  RLQ Pain severity:  Moderate Onset quality:  Gradual Timing:  Constant Progression:  Worsening Context: not recent illness   Relieved by:  Nothing Ineffective treatments:  OTC medications Associated symptoms: chest pain (with deep breathing), diarrhea, nausea and vomiting   Associated symptoms: no fever and no hematuria   Vomiting:    Quality:  Stomach contents   Number of occurrences:  5-10 Emesis Associated symptoms: abdominal pain and diarrhea   Diarrhea Quality:  Mucous and watery Number of episodes:  5-10 Associated symptoms: abdominal pain and vomiting   Associated symptoms: no fever     Past Medical History  Diagnosis Date  . Dental caries     History reviewed. No pertinent past surgical history.  History reviewed. No pertinent family history.  History  Substance Use Topics  . Smoking status: Current Every Day Smoker -- 0.50 packs/day    Types: Cigarettes  . Smokeless tobacco: Not on file  . Alcohol Use: 2.4 oz/week    4 Cans of beer per week     Comment: occasion      Review of Systems  Constitutional: Negative for fever.  Cardiovascular: Positive for chest pain (with deep breathing).  Gastrointestinal: Positive for nausea, vomiting, abdominal pain and diarrhea.  Genitourinary: Negative for hematuria.  All other systems reviewed and are negative.    Allergies  Naproxen  Home Medications   Current Outpatient Rx  Name  Route  Sig  Dispense  Refill  . DM-Phenylephrine-Acetaminophen (VICKS DAYQUIL COLD & FLU)  10-5-325 MG CAPS   Oral   Take 2 capsules by mouth every 6 (six) hours as needed. Cold symptoms.         Marland Kitchen HYDROcodone-acetaminophen (HYCET) 7.5-325 mg/15 ml solution   Oral   Take 15 mLs by mouth 4 (four) times daily as needed for pain or cough.   120 mL   0   . ibuprofen (ADVIL,MOTRIN) 600 MG tablet   Oral   Take 600 mg by mouth every 6 (six) hours as needed. For pain         . ondansetron (ZOFRAN) 4 MG tablet   Oral   Take 1 tablet (4 mg total) by mouth every 6 (six) hours.   12 tablet   0     BP 117/77  Pulse 95  Temp(Src) 99 F (37.2 C) (Oral)  Resp 16  SpO2 97%  Physical Exam  Nursing note and vitals reviewed. Constitutional: He appears well-developed and well-nourished. No distress.  HENT:  Head: Normocephalic and atraumatic.  Right Ear: External ear normal.  Left Ear: External ear normal.  Eyes: Conjunctivae are normal. Right eye exhibits no discharge. Left eye exhibits no discharge. No scleral icterus.  Neck: Neck supple. No tracheal deviation present.  Cardiovascular: Normal rate, regular rhythm and intact distal pulses.   Pulmonary/Chest: Effort normal and breath sounds normal. No stridor. No respiratory distress. He has no wheezes. He has no rales.  Abdominal: Soft. Bowel sounds are normal. He exhibits no distension. There is  tenderness in the suprapubic area. There is no rebound, no guarding, no tenderness at McBurney's point and negative Murphy's sign.  Musculoskeletal: He exhibits no edema and no tenderness.  Neurological: He is alert. He has normal strength. No sensory deficit. Cranial nerve deficit:  no gross defecits noted. He exhibits normal muscle tone. He displays no seizure activity. Coordination normal.  Skin: Skin is warm and dry. No rash noted.  Psychiatric: He has a normal mood and affect.    ED Course  Procedures (including critical care time)  Labs Reviewed - No data to display Dg Chest 2 View  08/17/2012  *RADIOLOGY REPORT*   Clinical Data: Chest pain.  Abdominal pain.  Headache.  Cough. Shortness of breath.  Smoker.  CHEST - 2 VIEW  Comparison: 07/29/2012  Findings: Mildly oblique lateral view. Midline trachea.  Normal heart size and mediastinal contours. No pleural effusion or pneumothorax.  Clear lungs.  IMPRESSION: No acute cardiopulmonary disease.   Original Report Authenticated By: Jeronimo Greaves, M.D.      1. Vomiting and diarrhea       MDM  Patient was treated with IV fluids and anti-medics. He is feeling somewhat better. He does continue to have headache but is not having any neck stiffness I doubt more serious etiology such as meningitis.  I suspect a viral illness considering the multitude of symptoms. I doubt pneumonia distance chest x-ray. Is not suggestive of appendicitis or colitis. He has not had further vomiting in the emergency department. I recommended he take Tylenol and Zofran as needed for nausea. he should return to emergency room for any worsening symptoms.        Celene Kras, MD 08/17/12 1351

## 2012-09-20 ENCOUNTER — Encounter (HOSPITAL_COMMUNITY): Payer: Self-pay | Admitting: Emergency Medicine

## 2012-09-20 ENCOUNTER — Emergency Department (HOSPITAL_COMMUNITY)
Admission: EM | Admit: 2012-09-20 | Discharge: 2012-09-20 | Disposition: A | Discharge status: Home / Self Care | Payer: Self-pay | Attending: Emergency Medicine | Admitting: Emergency Medicine

## 2012-09-20 DIAGNOSIS — F17200 Nicotine dependence, unspecified, uncomplicated: Secondary | ICD-10-CM | POA: Insufficient documentation

## 2012-09-20 DIAGNOSIS — K089 Disorder of teeth and supporting structures, unspecified: Secondary | ICD-10-CM | POA: Insufficient documentation

## 2012-09-20 DIAGNOSIS — K029 Dental caries, unspecified: Secondary | ICD-10-CM | POA: Insufficient documentation

## 2012-09-20 MED ORDER — PENICILLIN V POTASSIUM 500 MG PO TABS
500.0000 mg | ORAL_TABLET | Freq: Once | ORAL | Status: AC
  Administered 2012-09-20: 500 mg via ORAL
  Filled 2012-09-20: qty 1

## 2012-09-20 MED ORDER — PENICILLIN V POTASSIUM 500 MG PO TABS: 500.0000 mg | ORAL_TABLET | Freq: Three times a day (TID) | ORAL | Status: DC

## 2012-09-20 MED ORDER — HYDROCODONE-ACETAMINOPHEN 5-325 MG PO TABS: 2.0000 | ORAL_TABLET | ORAL | Status: DC | PRN

## 2012-09-20 MED ORDER — HYDROCODONE-ACETAMINOPHEN 5-325 MG PO TABS
1.0000 | ORAL_TABLET | Freq: Once | ORAL | Status: AC
  Administered 2012-09-20: 1 via ORAL
  Filled 2012-09-20: qty 1

## 2012-09-20 NOTE — ED Provider Notes (Signed)
Medical screening examination/treatment/procedure(s) were performed by non-physician practitioner and as supervising physician I was immediately available for consultation/collaboration.  Letcher Schweikert L Kaeleigh Westendorf, MD 09/20/12 1616 

## 2012-09-20 NOTE — ED Notes (Signed)
Pt c/o r/lower mouth pain. Reports cracked tooth ibn r/lower mouth x 3 days

## 2012-09-20 NOTE — ED Provider Notes (Signed)
History     CSN: [id number]  Arrival date & time 09/20/12  1143   First MD Initiated Contact with Patient 09/20/12 1201      Chief Complaint  Patient presents with  . Dental Injury    c/o cracked tooth in r/lower mouth  . Headache    (Consider location/radiation/quality/duration/timing/severity/associated sxs/prior treatment) HPI  27 year old male presents complaining of dental pain. Patient acknowledged that he has dental decay to his right lower second premolar.  He reports 4 days ago while chewing he accidentally cracked portion of the affected tooth and subsequently experiencing acute onset of sharp throbbing sensation to the affected tooth. Pain has been persistent, worsening with eating, with cold air, or cold water. Pain has radiates to his jaw. Patient denies fever, chills, sore throat, ear pain, neck pain, or rash. Patient has tried Motrin, and Tylenol with some relief. He does not have a dentist. He is a smoker. Patient denies any specific trauma.  Past Medical History  Diagnosis Date  . Dental caries     History reviewed. No pertinent past surgical history.  Family History  Problem Relation Age of Onset  . Diabetes Mother   . Hypertension Mother     History  Substance Use Topics  . Smoking status: Current Every Day Smoker -- 0.50 packs/day    Types: Cigarettes  . Smokeless tobacco: Not on file  . Alcohol Use: 2.4 oz/week    4 Cans of beer per week     Comment: occasion      Review of Systems  Constitutional: Negative for fever.  HENT: Positive for dental problem. Negative for ear pain, congestion and sore throat.   Skin: Negative for rash.  Neurological: Negative for headaches.    Allergies  Naproxen  Home Medications   Current Outpatient Rx  Name  Route  Sig  Dispense  Refill  . ibuprofen (ADVIL,MOTRIN) 600 MG tablet   Oral   Take 600 mg by mouth every 6 (six) hours as needed. For pain         . ondansetron (ZOFRAN ODT) 8 MG disintegrating  tablet   Oral   Take 1 tablet (8 mg total) by mouth every 8 (eight) hours as needed for nausea.   20 tablet   0     BP 117/69  Pulse 73  Temp(Src) 99 F (37.2 C) (Oral)  SpO2 100%  Physical Exam  Nursing note and vitals reviewed. Constitutional: He appears well-developed and well-nourished. No distress.  HENT:  Head: Normocephalic and atraumatic.  Right Ear: External ear normal.  Left Ear: External ear normal.  Mouth/Throat:    No trismus. No TMJ  Eyes: Conjunctivae are normal.  Neck: Normal range of motion. Neck supple.  Lymphadenopathy:    He has no cervical adenopathy.  Neurological: He is alert.  Skin: Skin is warm. No rash noted.    ED Course  Procedures (including critical care time)  12:12 PM Dental decay and dental avulsion, will benefit from abx and pain meds with close f/u with dentist.  Smoking cessation discussed.   Labs Reviewed - No data to display No results found.   1. Pain due to dental caries       MDM  BP 117/69  Pulse 73  Temp(Src) 99 F (37.2 C) (Oral)  SpO2 100%         Fayrene Helper, PA-C 09/20/12 1214

## 2012-11-01 ENCOUNTER — Emergency Department (HOSPITAL_COMMUNITY)
Admission: EM | Admit: 2012-11-01 | Discharge: 2012-11-01 | Disposition: A | Discharge status: Home / Self Care | Payer: Self-pay | Attending: Emergency Medicine | Admitting: Emergency Medicine

## 2012-11-01 ENCOUNTER — Encounter (HOSPITAL_COMMUNITY): Payer: Self-pay | Admitting: Emergency Medicine

## 2012-11-01 DIAGNOSIS — R748 Abnormal levels of other serum enzymes: Secondary | ICD-10-CM | POA: Insufficient documentation

## 2012-11-01 DIAGNOSIS — F17200 Nicotine dependence, unspecified, uncomplicated: Secondary | ICD-10-CM | POA: Insufficient documentation

## 2012-11-01 DIAGNOSIS — R109 Unspecified abdominal pain: Secondary | ICD-10-CM | POA: Insufficient documentation

## 2012-11-01 DIAGNOSIS — R7401 Elevation of levels of liver transaminase levels: Secondary | ICD-10-CM | POA: Insufficient documentation

## 2012-11-01 DIAGNOSIS — R7989 Other specified abnormal findings of blood chemistry: Secondary | ICD-10-CM

## 2012-11-01 DIAGNOSIS — R7402 Elevation of levels of lactic acid dehydrogenase (LDH): Secondary | ICD-10-CM | POA: Insufficient documentation

## 2012-11-01 LAB — COMPREHENSIVE METABOLIC PANEL
BUN: 15 mg/dL (ref 6–23)
CO2: 29 mEq/L (ref 19–32)
Calcium: 9.1 mg/dL (ref 8.4–10.5)
GFR calc Af Amer: 82 mL/min — ABNORMAL LOW (ref >90)
GFR calc non Af Amer: 71 mL/min — ABNORMAL LOW (ref >90)
Glucose, Bld: 95 mg/dL (ref 70–99)
Total Protein: 7 g/dL (ref 6.0–8.3)

## 2012-11-01 LAB — CBC WITH DIFFERENTIAL/PLATELET
Eosinophils Absolute: 0.6 10*3/uL (ref 0.0–0.7)
Eosinophils Relative: 11 % — ABNORMAL HIGH (ref 0–5)
HCT: 38.8 % — ABNORMAL LOW (ref 39.0–52.0)
Hemoglobin: 13 g/dL (ref 13.0–17.0)
Lymphs Abs: 1.7 10*3/uL (ref 0.7–4.0)
MCH: 27.1 pg (ref 26.0–34.0)
MCV: 80.8 fL (ref 78.0–100.0)
Monocytes Absolute: 0.2 10*3/uL (ref 0.1–1.0)
Monocytes Relative: 5 % (ref 3–12)
RBC: 4.8 MIL/uL (ref 4.22–5.81)

## 2012-11-01 LAB — URINALYSIS, ROUTINE W REFLEX MICROSCOPIC
Bilirubin Urine: NEGATIVE
Nitrite: NEGATIVE
Protein, ur: NEGATIVE mg/dL
Specific Gravity, Urine: 1.019 (ref 1.005–1.030)
Urobilinogen, UA: 0.2 mg/dL (ref 0.0–1.0)

## 2012-11-01 LAB — LIPASE, BLOOD: Lipase: 60 U/L — ABNORMAL HIGH (ref 11–59)

## 2012-11-01 MED ORDER — MORPHINE SULFATE 4 MG/ML IJ SOLN
4.0000 mg | Freq: Once | INTRAMUSCULAR | Status: AC
  Administered 2012-11-01: 4 mg via INTRAVENOUS
  Filled 2012-11-01: qty 1

## 2012-11-01 MED ORDER — ONDANSETRON HCL 4 MG/2ML IJ SOLN
4.0000 mg | Freq: Once | INTRAMUSCULAR | Status: AC
  Administered 2012-11-01: 4 mg via INTRAVENOUS
  Filled 2012-11-01: qty 2

## 2012-11-01 MED ORDER — OXYCODONE-ACETAMINOPHEN 5-325 MG PO TABS: ORAL_TABLET | ORAL | Status: DC

## 2012-11-01 NOTE — ED Provider Notes (Signed)
History     CSN: [id number]  Arrival date & time 11/01/12  1301   First MD Initiated Contact with Patient 11/01/12 1404      Chief Complaint  Patient presents with  . Abdominal Pain    (Consider location/radiation/quality/duration/timing/severity/associated sxs/prior treatment) HPI  Anthony Wiggins is a 27 y.o. male complaining of left upper and left lower quadrant pain radiating to the right side for the last 3 days. Patient denies fever, nausea vomiting, chest pain, shortness of breath, change in bowel or bladder habits. He states that the pain is exacerbated when he drinks alcohol. He is not a heavy drinker. He states that he drinks 1-2 beers a day. His last drink was 3 days ago onset of the pain. He's been taking acetaminophen for pain relief and has been minimally helpful. He rates the pain at 8/10, described as sharp sensation he says it is constant. No alleviating factors identified.   Past Medical History  Diagnosis Date  . Dental caries     History reviewed. No pertinent past surgical history.  Family History  Problem Relation Age of Onset  . Diabetes Mother   . Hypertension Mother     History  Substance Use Topics  . Smoking status: Current Every Day Smoker -- 0.50 packs/day    Types: Cigarettes  . Smokeless tobacco: Not on file  . Alcohol Use: 2.4 oz/week    4 Cans of beer per week     Comment: daily  - quit 3 days ago      Review of Systems  Constitutional: Negative for fever.  Respiratory: Negative for shortness of breath.   Cardiovascular: Negative for chest pain.  Gastrointestinal: Positive for abdominal pain. Negative for nausea, vomiting and diarrhea.  All other systems reviewed and are negative.    Allergies  Naproxen  Home Medications   Current Outpatient Rx  Name  Route  Sig  Dispense  Refill  . acetaminophen (TYLENOL) 500 MG tablet   Oral   Take 500 mg by mouth every 6 (six) hours as needed for pain.         Marland Kitchen ibuprofen  (ADVIL,MOTRIN) 600 MG tablet   Oral   Take 600 mg by mouth every 6 (six) hours as needed. For pain         . penicillin v potassium (VEETID) 500 MG tablet   Oral   Take 1 tablet (500 mg total) by mouth 3 (three) times daily.   30 tablet   0     BP 138/86  Pulse 62  Temp(Src) 98.7 F (37.1 C) (Oral)  SpO2 100%  Physical Exam  Nursing note and vitals reviewed. Constitutional: He is oriented to person, place, and time. He appears well-developed and well-nourished. No distress.  HENT:  Head: Normocephalic.  Mouth/Throat: Oropharynx is clear and moist.  Eyes: Conjunctivae and EOM are normal. Pupils are equal, round, and reactive to light.  Neck: Normal range of motion.  Cardiovascular: Normal rate, regular rhythm and normal heart sounds.   Pulmonary/Chest: Effort normal and breath sounds normal. No stridor. No respiratory distress. He has no wheezes. He has no rales. He exhibits no tenderness.  Abdominal: Soft. Bowel sounds are normal. He exhibits no distension and no mass. There is tenderness. There is no rebound and no guarding.  Tenderness to palpation in the epigastrium, no guarding or rebound.  Negative Murphy's, no tenderness to palpation over McBurney's point. Rovsing is negative, psoas and obturator are negative.  Genitourinary:  No CVA  tenderness bilaterally.  Musculoskeletal: Normal range of motion.  Neurological: He is alert and oriented to person, place, and time.  Psychiatric: He has a normal mood and affect.    ED Course  Procedures (including critical care time)  Labs Reviewed  CBC WITH DIFFERENTIAL - Abnormal; Notable for the following:    HCT 38.8 (*)    Eosinophils Relative 11 (*)    All other components within normal limits  COMPREHENSIVE METABOLIC PANEL - Abnormal; Notable for the following:    Creatinine, Ser 1.36 (*)    GFR calc non Af Amer 71 (*)    GFR calc Af Amer 82 (*)    All other components within normal limits  LIPASE, BLOOD - Abnormal;  Notable for the following:    Lipase 60 (*)    All other components within normal limits  URINALYSIS, ROUTINE W REFLEX MICROSCOPIC   No results found.   1. Abdominal pain   2. Elevated lipase   3. Elevated serum creatinine       MDM   Anthony Wiggins is a 27 y.o. male with abdominal pain and no associated symptoms worsening over the course of the last 3 days. Please note that while the triage note says he has right lower quadrant pain the patient is specifically deny this he says his pain is in the left upper and left lower quadrant and radiates to the right side.  Urinalysis shows no signs of infection. Patient has no leukocytosis. He has no electrolyte abnormalities. Patient does have a mildly elevated creatinine of 1.38. His lipase is indeterminate borderline elevated at 60.  Patient reports significant subjective improvement after pain control. We have discussed the fact that his lipase is elevated and the ED to DC to drinking. I have advised him on specific return precautions which he has repeated back to me. Patient seemed reliable for followup and understands to return precautions being given. I have also advised him to increase his hydration, secondary to elevation in his creatinine.  VSS and patient is appropriate for, and amenable to, discharge at this time. Pt verbalized understanding and agrees with care plan. Outpatient follow-up and return precautions given.    Filed Vitals:   11/01/12 1334  BP: 138/86  Pulse: 62  Temp: 98.7 F (37.1 C)  TempSrc: Oral  SpO2: 100%       Discharge Medication List as of 11/01/2012  4:03 PM    START taking these medications   Details  oxyCODONE-acetaminophen (PERCOCET/ROXICET) 5-325 MG per tablet 1 to 2 tabs PO q6hrs  PRN for pain, Print               Wynetta Emery, PA-C 11/01/12 1951

## 2012-11-01 NOTE — ED Notes (Signed)
States that pain started about 3 days ago after drinking beer. Drinks about 2 beers per day.

## 2012-11-01 NOTE — ED Notes (Signed)
Patient reports that he is having pain to his right lower quadrant. States that he stopped drinking so much ETOH the other day. Denies any N/V/D

## 2012-11-02 NOTE — ED Provider Notes (Signed)
Medical screening examination/treatment/procedure(s) were performed by non-physician practitioner and as supervising physician I was immediately available for consultation/collaboration.  Martha K Linker, MD 11/02/12 0707 

## 2013-02-17 ENCOUNTER — Emergency Department (HOSPITAL_COMMUNITY): Payer: Self-pay

## 2013-02-17 ENCOUNTER — Encounter (HOSPITAL_COMMUNITY): Payer: Self-pay | Admitting: Emergency Medicine

## 2013-02-17 ENCOUNTER — Emergency Department (HOSPITAL_COMMUNITY)
Admission: EM | Admit: 2013-02-17 | Discharge: 2013-02-17 | Disposition: A | Discharge status: Home / Self Care | Payer: Self-pay | Attending: Emergency Medicine | Admitting: Emergency Medicine

## 2013-02-17 DIAGNOSIS — Y9389 Activity, other specified: Secondary | ICD-10-CM | POA: Insufficient documentation

## 2013-02-17 DIAGNOSIS — Z8719 Personal history of other diseases of the digestive system: Secondary | ICD-10-CM | POA: Insufficient documentation

## 2013-02-17 DIAGNOSIS — M7918 Myalgia, other site: Secondary | ICD-10-CM

## 2013-02-17 DIAGNOSIS — W11XXXA Fall on and from ladder, initial encounter: Secondary | ICD-10-CM | POA: Insufficient documentation

## 2013-02-17 DIAGNOSIS — Y929 Unspecified place or not applicable: Secondary | ICD-10-CM | POA: Insufficient documentation

## 2013-02-17 DIAGNOSIS — F17200 Nicotine dependence, unspecified, uncomplicated: Secondary | ICD-10-CM | POA: Insufficient documentation

## 2013-02-17 DIAGNOSIS — S8990XA Unspecified injury of unspecified lower leg, initial encounter: Secondary | ICD-10-CM | POA: Insufficient documentation

## 2013-02-17 MED ORDER — HYDROCODONE-ACETAMINOPHEN 5-325 MG PO TABS
1.0000 | ORAL_TABLET | Freq: Once | ORAL | Status: AC
  Administered 2013-02-17: 1 via ORAL
  Filled 2013-02-17: qty 1

## 2013-02-17 MED ORDER — HYDROCODONE-ACETAMINOPHEN 5-325 MG PO TABS: ORAL_TABLET | ORAL | Status: DC

## 2013-02-17 NOTE — ED Notes (Signed)
PT. SLIPPED WHILE GOING DOWN LADDER ( LAST 2 STEPS) AND INJURED LEFT KNEE AND RIGHT FOOT , REPORTS PAIN AT LEFT KNEE AND RIGHT FOOT , NO LOC/ AMBULATORY.

## 2013-02-17 NOTE — ED Provider Notes (Signed)
This chart was scribed for Wynetta Emery PA-C, a non-physician practitioner working with Glynn Octave, MD by Lewanda Rife, ED Scribe. This patient was seen in room [facility] and the patient's care was started at 2028.    CSN: [id number]     Arrival date & time 02/17/13  1911 History   First MD Initiated Contact with Patient 02/17/13 1959     Chief Complaint  Patient presents with  . Knee Pain  . Foot Pain   (Consider location/radiation/quality/duration/timing/severity/associated sxs/prior Treatment) The history is provided by the patient.   HPI Comments: Anthony Wiggins is a 27 y.o. male who presents to the Emergency Department complaining of constant left knee pain and right foot pain onset acute after a mechanical fall off lower 2 steps of a ladder PTA. Describes pain as 8/10 in severity. Denies associated LOC, head injury, and neck injury. Reports pain is aggravated with weight bearing and alleviated by nothing. Reports icing with mild relief of symptoms.   Past Medical History  Diagnosis Date  . Dental caries    History reviewed. No pertinent past surgical history. Family History  Problem Relation Age of Onset  . Diabetes Mother   . Hypertension Mother    History  Substance Use Topics  . Smoking status: Current Every Day Smoker -- 0.50 packs/day    Types: Cigarettes  . Smokeless tobacco: Not on file  . Alcohol Use: 2.4 oz/week    4 Cans of beer per week     Comment: daily  - quit 3 days ago    Review of Systems A complete 10 system review of systems was obtained and all systems are negative except as noted in the HPI and PMH.    Allergies  Naproxen  Home Medications   Current Outpatient Rx  Name  Route  Sig  Dispense  Refill  . acetaminophen (TYLENOL) 500 MG tablet   Oral   Take 500 mg by mouth every 6 (six) hours as needed for pain.         Marland Kitchen ibuprofen (ADVIL,MOTRIN) 600 MG tablet   Oral   Take 600 mg by mouth every 6 (six) hours as needed. For  pain         . oxyCODONE-acetaminophen (PERCOCET/ROXICET) 5-325 MG per tablet      1 to 2 tabs PO q6hrs  PRN for pain   15 tablet   0   . penicillin v potassium (VEETID) 500 MG tablet   Oral   Take 1 tablet (500 mg total) by mouth 3 (three) times daily.   30 tablet   0    BP 135/84  Pulse 76  Temp(Src) 98.4 F (36.9 C) (Oral)  Resp 16  SpO2 99% Physical Exam  Nursing note and vitals reviewed. Constitutional: He is oriented to person, place, and time. He appears well-developed and well-nourished. No distress.  HENT:  Head: Normocephalic.  Mouth/Throat: Oropharynx is clear and moist.  Eyes: Conjunctivae and EOM are normal. Pupils are equal, round, and reactive to light.  Neck: Normal range of motion.  No midline tenderness to palpation or step-offs appreciated. Patient has full range of motion without pain.   Cardiovascular: Normal rate.   Pulmonary/Chest: Effort normal and breath sounds normal. No stridor. No respiratory distress. He has no wheezes. He has no rales. He exhibits no tenderness.  Musculoskeletal: Normal range of motion.  Left knee: No deformity, erythema or abrasions. FROM. No effusion or crepitance. Anterior and posterior drawer show no abnormal laxity. Stable  to valgus and varus stress. Joint lines are non-tender. Neurovascularly intact. Pt ambulates with non-antalgic gait.    Neurological: He is alert and oriented to person, place, and time.  Psychiatric: He has a normal mood and affect.    ED Course  Procedures (including critical care time) Medications - No data to display  Labs Review Labs Reviewed - No data to display Imaging Review No results found.  MDM   1. Musculoskeletal pain    Filed Vitals:   02/17/13 1927  BP: 135/84  Pulse: 76  Temp: 98.4 F (36.9 C)  TempSrc: Oral  Resp: 16  SpO2: 99%     Anthony Wiggins is a 27 y.o. male with left knee and right foot pain after he slid off the last 2 steps of a ladder. Plain film showed no  abnormalities. We'll give the patient pain control medication and crutches.   Medications  HYDROcodone-acetaminophen (NORCO/VICODIN) 5-325 MG per tablet 1 tablet (1 tablet Oral Given 02/17/13 2039)    Pt is hemodynamically stable, appropriate for, and amenable to discharge at this time. Pt verbalized understanding and agrees with care plan. All questions answered. Outpatient follow-up and specific return precautions discussed.    Discharge Medication List as of 02/17/2013  8:33 PM    START taking these medications   Details  HYDROcodone-acetaminophen (NORCO/VICODIN) 5-325 MG per tablet Take 1-2 tablets by mouth every 6 hours as needed for pain., Print        I personally performed the services described in this documentation, which was scribed in my presence. The recorded information has been reviewed and is accurate.  Note: Portions of this report may have been transcribed using voice recognition software. Every effort was made to ensure accuracy; however, inadvertent computerized transcription errors may be present    Wynetta Emery, PA-C 02/18/13 2045

## 2013-02-19 NOTE — ED Provider Notes (Signed)
Medical screening examination/treatment/procedure(s) were performed by non-physician practitioner and as supervising physician I was immediately available for consultation/collaboration.   Rafal Archuleta, MD 02/19/13 1219 

## 2013-03-10 ENCOUNTER — Emergency Department (HOSPITAL_COMMUNITY)
Admission: EM | Admit: 2013-03-10 | Discharge: 2013-03-10 | Disposition: A | Discharge status: Home / Self Care | Payer: Self-pay | Attending: Emergency Medicine | Admitting: Emergency Medicine

## 2013-03-10 ENCOUNTER — Encounter (HOSPITAL_COMMUNITY): Payer: Self-pay | Admitting: Emergency Medicine

## 2013-03-10 DIAGNOSIS — K089 Disorder of teeth and supporting structures, unspecified: Secondary | ICD-10-CM | POA: Insufficient documentation

## 2013-03-10 DIAGNOSIS — K0889 Other specified disorders of teeth and supporting structures: Secondary | ICD-10-CM

## 2013-03-10 DIAGNOSIS — F17200 Nicotine dependence, unspecified, uncomplicated: Secondary | ICD-10-CM | POA: Insufficient documentation

## 2013-03-10 DIAGNOSIS — Z792 Long term (current) use of antibiotics: Secondary | ICD-10-CM | POA: Insufficient documentation

## 2013-03-10 DIAGNOSIS — R51 Headache: Secondary | ICD-10-CM | POA: Insufficient documentation

## 2013-03-10 DIAGNOSIS — R6883 Chills (without fever): Secondary | ICD-10-CM | POA: Insufficient documentation

## 2013-03-10 NOTE — ED Notes (Signed)
Pt c/o left lower dental pain x 3 days.

## 2013-03-10 NOTE — ED Provider Notes (Signed)
Medical screening examination/treatment/procedure(s) were performed by non-physician practitioner and as supervising physician I was immediately available for consultation/collaboration.  Ciin Brazzel, MD 03/10/13 2230 

## 2013-03-10 NOTE — ED Notes (Signed)
Pt comfortable with d/c and f/u instructions. No prescriptions 

## 2013-03-10 NOTE — ED Provider Notes (Signed)
CSN: [id number]     Arrival date & time 03/10/13  1539 History  This chart was scribed for non-physician practitioner Johnnette Gourd, PA,  working with Donnetta Hutching, MD, by [facility] ED Scribe. This patient was seen in room TR04C/TR04C and the patient's care was started at 4:59PM.    Chief Complaint  Patient presents with  . Dental Pain    The history is provided by the patient. No language interpreter was used.   HPI Comments: Anthony Wiggins is a 27 y.o. male who presents to the Emergency Department complaining of constant, moderate gradually worsening left lower dental pain onset 2 days ago. He rates his pain at a "8/10" severity and describes his pain as "throbbing". He reports associated chills. He states that he has taken Ibuprofen with mild relief of his pain. He states that he has no dentist. Pt denies fever, difficulty swallowing or any other symptoms.    Past Medical History  Diagnosis Date  . Dental caries    History reviewed. No pertinent past surgical history. Family History  Problem Relation Age of Onset  . Diabetes Mother   . Hypertension Mother    History  Substance Use Topics  . Smoking status: Current Every Day Smoker -- 0.50 packs/day    Types: Cigarettes  . Smokeless tobacco: Not on file  . Alcohol Use: 2.4 oz/week    4 Cans of beer per week     Comment: daily  - quit 3 days ago    Review of Systems  Constitutional: Positive for chills. Negative for fever.  HENT: Positive for dental problem. Negative for trouble swallowing.   Neurological: Positive for headaches.  All other systems reviewed and are negative.   Allergies  Naproxen  Home Medications   Current Outpatient Rx  Name  Route  Sig  Dispense  Refill  . acetaminophen (TYLENOL) 500 MG tablet   Oral   Take 1,000 mg by mouth every 4 (four) hours as needed for pain.         Marland Kitchen ibuprofen (ADVIL,MOTRIN) 600 MG tablet   Oral   Take 600 mg by mouth every 6 (six) hours as needed. For pain          . penicillin v potassium (VEETID) 500 MG tablet   Oral   Take 500 mg by mouth daily.          Triage Vitals:  Pulse 66  Temp(Src) 98.5 F (36.9 C) (Oral)  Resp 18  SpO2 100%  Physical Exam  Nursing note and vitals reviewed. Constitutional: He is oriented to person, place, and time. He appears well-developed and well-nourished. No distress.  HENT:  Head: Normocephalic and atraumatic.  Left lower 2nd molar with tooth decay. No surrounding erythema, edema or abscess. No facial swelling. No adenopathy.  Eyes: Conjunctivae and EOM are normal.  Neck: Normal range of motion. Neck supple.  Cardiovascular: Normal rate, regular rhythm and normal heart sounds.   Pulmonary/Chest: Effort normal and breath sounds normal.  Musculoskeletal: Normal range of motion. He exhibits no edema.  Neurological: He is alert and oriented to person, place, and time.  Skin: Skin is warm and dry.  Psychiatric: He has a normal mood and affect. His behavior is normal.     ED Course  Procedures (including critical care time)  DIAGNOSTIC STUDIES: Oxygen Saturation is 100% on RA, normal by my interpretation.    COORDINATION OF CARE: 5:03 PM- Pt advised of plan for treatment and pt agrees.  Labs  Review Labs Reviewed - No data to display Imaging Review No results found.  MDM   1. Pain, dental    Dental pain without dental infection. Advised ibuprofen for pain, ice as needed. Followup with the dentist. Return precautions discussed. Patient states understanding of plan and is agreeable.   I personally performed the services described in this documentation, which was scribed in my presence. The recorded information has been reviewed and is accurate.   Trevor Mace, PA-C 03/10/13 1730

## 2013-03-10 NOTE — ED Notes (Signed)
Pt presents with left lower dental pain- denies abscess- states he has been icing his face at home without relief.  Admits to headache.

## 2013-06-10 ENCOUNTER — Encounter (HOSPITAL_COMMUNITY): Payer: Self-pay | Admitting: Emergency Medicine

## 2013-06-10 DIAGNOSIS — R51 Headache: Secondary | ICD-10-CM | POA: Insufficient documentation

## 2013-06-10 DIAGNOSIS — F17200 Nicotine dependence, unspecified, uncomplicated: Secondary | ICD-10-CM | POA: Insufficient documentation

## 2013-06-10 LAB — CBC
MCHC: 34.1 g/dL (ref 30.0–36.0)
MCV: 82.9 fL (ref 78.0–100.0)
Platelets: 266 10*3/uL (ref 150–400)
RDW: 13.2 % (ref 11.5–15.5)
WBC: 8.7 10*3/uL (ref 4.0–10.5)

## 2013-06-10 LAB — POCT I-STAT, CHEM 8
Calcium, Ion: 1.23 mmol/L (ref 1.12–1.23)
Chloride: 104 mEq/L (ref 96–112)
HCT: 46 % (ref 39.0–52.0)
Hemoglobin: 15.6 g/dL (ref 13.0–17.0)
TCO2: 29 mmol/L (ref 0–100)

## 2013-06-10 NOTE — ED Notes (Signed)
No neuro deficits noted.

## 2013-06-10 NOTE — ED Notes (Signed)
Pt c/o HA and right side top 3rd from back toothache

## 2013-06-11 ENCOUNTER — Emergency Department (HOSPITAL_COMMUNITY)
Admission: EM | Admit: 2013-06-11 | Discharge: 2013-06-11 | Discharge status: Against Medical Advice | Payer: No Typology Code available for payment source | Attending: Emergency Medicine | Admitting: Emergency Medicine

## 2013-06-11 NOTE — ED Notes (Signed)
Unable to locate pt x1

## 2013-06-11 NOTE — ED Notes (Signed)
No answer in waiting area.

## 2013-06-11 NOTE — ED Notes (Signed)
Pt did not respond when called from the waiting room

## 2013-08-24 ENCOUNTER — Encounter (HOSPITAL_COMMUNITY): Payer: Self-pay | Admitting: Emergency Medicine

## 2013-08-24 ENCOUNTER — Emergency Department (HOSPITAL_COMMUNITY)
Admission: EM | Admit: 2013-08-24 | Discharge: 2013-08-24 | Disposition: A | Discharge status: Home / Self Care | Payer: No Typology Code available for payment source | Attending: Emergency Medicine | Admitting: Emergency Medicine

## 2013-08-24 DIAGNOSIS — R059 Cough, unspecified: Secondary | ICD-10-CM | POA: Insufficient documentation

## 2013-08-24 DIAGNOSIS — K029 Dental caries, unspecified: Secondary | ICD-10-CM | POA: Insufficient documentation

## 2013-08-24 DIAGNOSIS — F17200 Nicotine dependence, unspecified, uncomplicated: Secondary | ICD-10-CM | POA: Insufficient documentation

## 2013-08-24 DIAGNOSIS — Z888 Allergy status to other drugs, medicaments and biological substances status: Secondary | ICD-10-CM | POA: Insufficient documentation

## 2013-08-24 DIAGNOSIS — M6281 Muscle weakness (generalized): Secondary | ICD-10-CM | POA: Insufficient documentation

## 2013-08-24 DIAGNOSIS — IMO0001 Reserved for inherently not codable concepts without codable children: Secondary | ICD-10-CM | POA: Insufficient documentation

## 2013-08-24 DIAGNOSIS — B349 Viral infection, unspecified: Secondary | ICD-10-CM

## 2013-08-24 DIAGNOSIS — B9789 Other viral agents as the cause of diseases classified elsewhere: Secondary | ICD-10-CM | POA: Insufficient documentation

## 2013-08-24 DIAGNOSIS — R05 Cough: Secondary | ICD-10-CM | POA: Insufficient documentation

## 2013-08-24 DIAGNOSIS — R509 Fever, unspecified: Secondary | ICD-10-CM | POA: Insufficient documentation

## 2013-08-24 DIAGNOSIS — M2550 Pain in unspecified joint: Secondary | ICD-10-CM | POA: Insufficient documentation

## 2013-08-24 MED ORDER — KETOROLAC TROMETHAMINE 10 MG PO TABS
10.0000 mg | ORAL_TABLET | Freq: Once | ORAL | Status: AC
  Administered 2013-08-24: 10 mg via ORAL
  Filled 2013-08-24: qty 1

## 2013-08-24 MED ORDER — ACETAMINOPHEN-CODEINE #3 300-30 MG PO TABS: 1.0000 | ORAL_TABLET | Freq: Four times a day (QID) | ORAL | Status: DC | PRN

## 2013-08-24 MED ORDER — PROMETHAZINE HCL 12.5 MG PO TABS
12.5000 mg | ORAL_TABLET | Freq: Once | ORAL | Status: AC
  Administered 2013-08-24: 12.5 mg via ORAL
  Filled 2013-08-24: qty 1

## 2013-08-24 MED ORDER — ACETAMINOPHEN-CODEINE #3 300-30 MG PO TABS
1.0000 | ORAL_TABLET | Freq: Once | ORAL | Status: AC
  Administered 2013-08-24: 1 via ORAL
  Filled 2013-08-24: qty 1

## 2013-08-24 NOTE — ED Provider Notes (Signed)
CSN: [id number]     Arrival date & time 08/24/13  1030 History  This chart was scribed for non-physician practitioner, Ivery Quale, PA-C working with Suzi Roots, MD by Greggory Stallion, ED scribe. This patient was seen in room TR08C/TR08C and the patient's care was started at 12:16 PM.   Chief Complaint  Patient presents with  . Generalized Body Aches  . Cough  . Weakness   The history is provided by the patient. No language interpreter was used.   HPI Comments: Anthony Wiggins is a 28 y.o. male who presents to the Emergency Department complaining of generalized body aches, headache and nonproductive cough that started 2 days ago. Pt has also had a fever around 100 and intermittent nasal congestion. Denies sore throat, hemoptysis, diarrhea, emesis, rash. Pt's children were recently sick with similar symptoms.   Past Medical History  Diagnosis Date  . Dental caries    History reviewed. No pertinent past surgical history. Family History  Problem Relation Age of Onset  . Diabetes Mother   . Hypertension Mother    History  Substance Use Topics  . Smoking status: Current Every Day Smoker -- 0.50 packs/day    Types: Cigarettes  . Smokeless tobacco: Not on file  . Alcohol Use: 2.4 oz/week    4 Cans of beer per week    Review of Systems  Constitutional: Positive for fever.  HENT: Negative for sore throat.   Respiratory: Positive for cough.   Gastrointestinal: Negative for vomiting and diarrhea.  Musculoskeletal: Positive for arthralgias and myalgias.  Skin: Negative for rash.  All other systems reviewed and are negative.   Allergies  Naproxen  Home Medications   Current Outpatient Rx  Name  Route  Sig  Dispense  Refill  . Aspirin-Acetaminophen (GOODY BODY PAIN) 500-325 MG PACK   Oral   Take 1 packet by mouth every 6 (six) hours as needed (for pain).          . chlorpheniramine-pseudoephedrine-acetaminophen (SINUTAB) 2-30-500 MG per tablet   Oral   Take 1 tablet by  mouth every 4 (four) hours as needed for allergies.         Marland Kitchen ibuprofen (ADVIL,MOTRIN) 600 MG tablet   Oral   Take 600 mg by mouth every 6 (six) hours as needed. For pain          BP 126/78  Pulse 75  Temp(Src) 98.8 F (37.1 C) (Oral)  Resp 18  Ht 5\' 9"  (1.753 m)  Wt 216 lb (97.977 kg)  BMI 31.88 kg/m2  SpO2 97%  Physical Exam  Nursing note and vitals reviewed. Constitutional: He is oriented to person, place, and time. He appears well-developed and well-nourished. No distress.  HENT:  Head: Normocephalic and atraumatic.  Nose: Right sinus exhibits no maxillary sinus tenderness and no frontal sinus tenderness. Left sinus exhibits no maxillary sinus tenderness and no frontal sinus tenderness.  Mild increased redness of posterior pharynx.   Eyes: EOM are normal.  Neck: Neck supple. No tracheal deviation present.  Cardiovascular: Normal rate.   Pulmonary/Chest: Effort normal and breath sounds normal. No respiratory distress. He has no wheezes. He has no rhonchi. He has no rales.  Abdominal: Bowel sounds are normal.  Musculoskeletal: Normal range of motion.  Lymphadenopathy:    He has no cervical adenopathy.  Neurological: He is alert and oriented to person, place, and time.  Skin: Skin is warm and dry.  Psychiatric: He has a normal mood and affect. His behavior is  normal.    ED Course  Procedures (including critical care time)  DIAGNOSTIC STUDIES: Oxygen Saturation is 97% on RA, normal by my interpretation.    COORDINATION OF CARE: 12:21 PM-Discussed treatment plan which includes medications and keeping hydrated with pt at bedside and pt agreed to plan.   Labs Review Labs Reviewed - No data to display Imaging Review No results found.   EKG Interpretation None      MDM Pt presents toED with body aches, weakness, headache and cough. Temp wnl at admission to ED, temp max 100.3. Pulse ox 97%. Pt speaks in complete sentences. Pt feels better after promethazine,  toadol, and tylenol codeine. Suspect URI. No neuro deficits. No hx of hemoptosis or excessive SOB. Plan - Pt to use tylenol for mild aching(allergic to naproxen). Tylenol-codeine for more severe aching or headache. Pt to see PCP or return to the ED if not improving, or any emergent changes.   Final diagnoses:  None    *I have reviewed nursing notes, vital signs, and all appropriate lab and imaging results for this patient.**  **I personally performed the services described in this documentation, which was scribed in my presence. The recorded information has been reviewed and is accurate.Kathie Dike*   Berda Shelvin M Ezmae Speers, PA-C 08/27/13 1339

## 2013-08-24 NOTE — Discharge Instructions (Signed)
Your examination is consistent with a viral illness. Please increase fluids. Please use your mask until symptoms have resolved. Please use Tylenol for fever or for mild pain. Use Tylenol codeine for more severe pain. Wash hands frequently. Viral Infections A virus is a type of germ. Viruses can cause:  Minor sore throats.  Aches and pains.  Headaches.  Runny nose.  Rashes.  Watery eyes.  Tiredness.  Coughs.  Loss of appetite.  Feeling sick to your stomach (nausea).  Throwing up (vomiting).  Watery poop (diarrhea). HOME CARE   Only take medicines as told by your doctor.  Drink enough water and fluids to keep your pee (urine) clear or pale yellow. Sports drinks are a good choice.  Get plenty of rest and eat healthy. Soups and broths with crackers or rice are fine. GET HELP RIGHT AWAY IF:   You have a very bad headache.  You have shortness of breath.  You have chest pain or neck pain.  You have an unusual rash.  You cannot stop throwing up.  You have watery poop that does not stop.  You cannot keep fluids down.  You or your child has a temperature by mouth above 102 F (38.9 C), not controlled by medicine.  Your baby is older than 3 months with a rectal temperature of 102 F (38.9 C) or higher.  Your baby is 213 months old or younger with a rectal temperature of 100.4 F (38 C) or higher. MAKE SURE YOU:   Understand these instructions.  Will watch this condition.  Will get help right away if you are not doing well or get worse. Document Released: 05/24/2008 Document Revised: 09/03/2011 Document Reviewed: 10/17/2010 [facility] Patient Information 2014 [place], [place].

## 2013-08-24 NOTE — ED Notes (Signed)
Waiting on other medications from pharmacy.    [facility].

## 2013-08-24 NOTE — ED Notes (Signed)
Patient reports onset of feeling weak, body aches, headaches, and cough since Saturday.  Patient denies any n/v/d.  Patient states he does not have an appetite.

## 2013-08-30 NOTE — ED Provider Notes (Signed)
Medical screening examination/treatment/procedure(s) were performed by non-physician practitioner and as supervising physician I was immediately available for consultation/collaboration.   EKG Interpretation None        Otis Burress E Wayman Hoard, MD 08/30/13 0711 

## 2014-05-11 ENCOUNTER — Encounter (HOSPITAL_COMMUNITY): Payer: Self-pay | Admitting: *Deleted

## 2014-05-11 ENCOUNTER — Emergency Department (HOSPITAL_COMMUNITY)
Admission: EM | Admit: 2014-05-11 | Discharge: 2014-05-11 | Disposition: A | Discharge status: Home / Self Care | Payer: No Typology Code available for payment source | Attending: Emergency Medicine | Admitting: Emergency Medicine

## 2014-05-11 DIAGNOSIS — K029 Dental caries, unspecified: Secondary | ICD-10-CM | POA: Insufficient documentation

## 2014-05-11 DIAGNOSIS — R51 Headache: Secondary | ICD-10-CM | POA: Insufficient documentation

## 2014-05-11 DIAGNOSIS — Z720 Tobacco use: Secondary | ICD-10-CM | POA: Insufficient documentation

## 2014-05-11 DIAGNOSIS — K0889 Other specified disorders of teeth and supporting structures: Secondary | ICD-10-CM

## 2014-05-11 DIAGNOSIS — K088 Other specified disorders of teeth and supporting structures: Secondary | ICD-10-CM | POA: Insufficient documentation

## 2014-05-11 MED ORDER — AMOXICILLIN 500 MG PO CAPS: 500.0000 mg | ORAL_CAPSULE | Freq: Three times a day (TID) | ORAL | Status: DC

## 2014-05-11 MED ORDER — HYDROCODONE-ACETAMINOPHEN 5-325 MG PO TABS: 1.0000 | ORAL_TABLET | ORAL | Status: DC | PRN

## 2014-05-11 NOTE — ED Notes (Signed)
Patient ate something and broke a tooth on Sunday. Patient had a dental referral previously and took 2 percocet and penicillin he had left over from the dental clinic, but states the pain continues to get worse. He has been taking extra strength tylenol with now relief.

## 2014-05-11 NOTE — ED Provider Notes (Signed)
CSN: [id number]     Arrival date & time 05/11/14  1600 History  This chart was scribed for non-physician practitioner, Celene Skeenobyn Future Yeldell, PA-C working with Toy CookeyMegan Docherty, MD by Freida Busmaniana Omoyeni, ED Scribe. This patient was seen in room WTR8/WTR8 and the patient's care was started at 5:10 PM.    No chief complaint on file.   The history is provided by the patient. No language interpreter was used.    HPI Comments:  Anthony Wiggins is a 28 y.o. male  who presents to the Emergency Department complaining of throbbing pain to his left upper jaw that started 3 days ago. Pt notes pain started while he was eating, states he felt a crack. He reports associated mild HA due to his dental pain. He denies fever, facial swelling or trouble swallowing. He has tried tylenol with mild relief. He states he also took percocet 2 days ago with mild relief.   Past Medical History  Diagnosis Date  . Dental caries    History reviewed. No pertinent past surgical history. Family History  Problem Relation Age of Onset  . Diabetes Mother   . Hypertension Mother    History  Substance Use Topics  . Smoking status: Current Every Day Smoker -- 0.50 packs/day    Types: Cigarettes  . Smokeless tobacco: Not on file  . Alcohol Use: 2.4 oz/week    4 Cans of beer per week    Review of Systems  HENT: Positive for dental problem.   Neurological: Positive for headaches.  All other systems reviewed and are negative.     Allergies  Naproxen  Home Medications   Prior to Admission medications   Medication Sig Start Date End Date Taking? Authorizing Provider  acetaminophen (TYLENOL) 500 MG tablet Take 1,000 mg by mouth every 4 (four) hours as needed for moderate pain.   Yes Historical Provider, MD  oxyCODONE-acetaminophen (PERCOCET/ROXICET) 5-325 MG per tablet Take 1 tablet by mouth once.   Yes Historical Provider, MD  acetaminophen-codeine (TYLENOL #3) 300-30 MG per tablet Take 1 tablet by mouth every 6 (six) hours as  needed for moderate pain. 08/24/13   Kathie DikeHobson M Bryant, PA-C  amoxicillin (AMOXIL) 500 MG capsule Take 1 capsule (500 mg total) by mouth 3 (three) times daily. 05/11/14   Kathrynn Speedobyn M Antrone Walla, PA-C  HYDROcodone-acetaminophen (NORCO/VICODIN) 5-325 MG per tablet Take 1-2 tablets by mouth every 4 (four) hours as needed. 05/11/14   Roylene Heaton M Brycelyn Gambino, PA-C  ibuprofen (ADVIL,MOTRIN) 600 MG tablet Take 600 mg by mouth every 4 (four) hours as needed for mild pain or moderate pain. For pain 07/10/12   Jimmye Normanavid John Smith, NP   BP 128/74 mmHg  Pulse 76  Temp(Src) 98.4 F (36.9 C) (Oral)  Resp 20  SpO2 100% Physical Exam  Constitutional: He is oriented to person, place, and time. He appears well-developed and well-nourished. No distress.  HENT:  Head: Normocephalic and atraumatic.  Mouth/Throat:    Eyes: Conjunctivae and EOM are normal.  Neck: Normal range of motion. Neck supple.  Cardiovascular: Normal rate, regular rhythm and normal heart sounds.   Pulmonary/Chest: Effort normal and breath sounds normal.  Musculoskeletal: Normal range of motion. He exhibits no edema.  Lymphadenopathy:       Head (right side): No submental and no submandibular adenopathy present.       Head (left side): No submental and no submandibular adenopathy present.    He has no cervical adenopathy.  Neurological: He is alert and oriented to person, place,  and time.  Skin: Skin is warm and dry.  Psychiatric: He has a normal mood and affect. His behavior is normal.  Nursing note and vitals reviewed.   ED Course  Procedures   DIAGNOSTIC STUDIES:  Oxygen Saturation is 100% on RA, normal by my interpretation.    COORDINATION OF CARE:  5:15 PM Discussed treatment plan with pt at bedside and pt agreed to plan.  Labs Review Labs Reviewed - No data to display  Imaging Review No results found.   EKG Interpretation None      MDM   Final diagnoses:  Pain, dental  Dental caries     Dental pain associated with dental  infection. No evidence of dental abscess. Patient is afebrile, non toxic appearing and swallowing secretions well. I gave patient referral to dentist and stressed the importance of dental follow up for ultimate management of dental pain. I will also give amoxicillin and pain control. Patient voices understanding and is agreeable to plan.   I personally performed the services described in this documentation, which was scribed in my presence. The recorded information has been reviewed and is accurate.   Kathrynn SpeedRobyn M Jakelin Taussig, PA-C 05/11/14 1719  Toy CookeyMegan Docherty, MD 05/13/14 [phone]

## 2014-05-11 NOTE — Discharge Instructions (Signed)
Take antibiotic to completion. Take Vicodin for severe pain only. No driving or operating heavy machinery while taking vicodin. This medication may cause drowsiness.  Dental Pain A tooth ache may be caused by cavities (tooth decay). Cavities expose the nerve of the tooth to air and hot or cold temperatures. It may come from an infection or abscess (also called a boil or furuncle) around your tooth. It is also often caused by dental caries (tooth decay). This causes the pain you are having. DIAGNOSIS  Your caregiver can diagnose this problem by exam. TREATMENT   If caused by an infection, it may be treated with medications which kill germs (antibiotics) and pain medications as prescribed by your caregiver. Take medications as directed.  Only take over-the-counter or prescription medicines for pain, discomfort, or fever as directed by your caregiver.  Whether the tooth ache today is caused by infection or dental disease, you should see your dentist as soon as possible for further care. SEEK MEDICAL CARE IF: The exam and treatment you received today has been provided on an emergency basis only. This is not a substitute for complete medical or dental care. If your problem worsens or new problems (symptoms) appear, and you are unable to meet with your dentist, call or return to this location. SEEK IMMEDIATE MEDICAL CARE IF:   You have a fever.  You develop redness and swelling of your face, jaw, or neck.  You are unable to open your mouth.  You have severe pain uncontrolled by pain medicine. MAKE SURE YOU:   Understand these instructions.  Will watch your condition.  Will get help right away if you are not doing well or get worse. Document Released: 06/11/2005 Document Revised: 09/03/2011 Document Reviewed: 01/28/2008 [facility] Patient Information 2015 [place], [place]. This information is not intended to replace advice given to you by your health care provider. Make sure you discuss any  questions you have with your health care provider.  Dental Caries Dental caries (also called tooth decay) is the most common oral disease. It can occur at any age but is more common in children and young adults.  HOW DENTAL CARIES DEVELOPS  The process of decay begins when bacteria and foods (particularly sugars and starches) combine in your mouth to produce plaque. Plaque is a substance that sticks to the hard, outer surface of a tooth (enamel). The bacteria in plaque produce acids that attack enamel. These acids may also attack the root surface of a tooth (cementum) if it is exposed. Repeated attacks dissolve these surfaces and create holes in the tooth (cavities). If left untreated, the acids destroy the other layers of the tooth.  RISK FACTORS  Frequent sipping of sugary beverages.   Frequent snacking on sugary and starchy foods, especially those that easily get stuck in the teeth.   Poor oral hygiene.   Dry mouth.   Substance abuse such as methamphetamine abuse.   Broken or poor-fitting dental restorations.   Eating disorders.   Gastroesophageal reflux disease (GERD).   Certain radiation treatments to the head and neck. SYMPTOMS In the early stages of dental caries, symptoms are seldom present. Sometimes white, chalky areas may be seen on the enamel or other tooth layers. In later stages, symptoms may include:  Pits and holes on the enamel.  Toothache after sweet, hot, or cold foods or drinks are consumed.  Pain around the tooth.  Swelling around the tooth. DIAGNOSIS  Most of the time, dental caries is detected during a regular dental  checkup. A diagnosis is made after a thorough medical and dental history is taken and the surfaces of your teeth are checked for signs of dental caries. Sometimes special instruments, such as lasers, are used to check for dental caries. Dental X-ray exams may be taken so that areas not visible to the eye (such as between the contact  areas of the teeth) can be checked for cavities.  TREATMENT  If dental caries is in its early stages, it may be reversed with a fluoride treatment or an application of a remineralizing agent at the dental office. Thorough brushing and flossing at home is needed to aid these treatments. If it is in its later stages, treatment depends on the location and extent of tooth destruction:   If a small area of the tooth has been destroyed, the destroyed area will be removed and cavities will be filled with a material such as gold, silver amalgam, or composite resin.   If a large area of the tooth has been destroyed, the destroyed area will be removed and a cap (crown) will be fitted over the remaining tooth structure.   If the center part of the tooth (pulp) is affected, a procedure called a root canal will be needed before a filling or crown can be placed.   If most of the tooth has been destroyed, the tooth may need to be pulled (extracted). HOME CARE INSTRUCTIONS You can prevent, stop, or reverse dental caries at home by practicing good oral hygiene. Good oral hygiene includes:  Thoroughly cleaning your teeth at least twice a day with a toothbrush and dental floss.   Using a fluoride toothpaste. A fluoride mouth rinse may also be used if recommended by your dentist or health care provider.   Restricting the amount of sugary and starchy foods and sugary liquids you consume.   Avoiding frequent snacking on these foods and sipping of these liquids.   Keeping regular visits with a dentist for checkups and cleanings. PREVENTION   Practice good oral hygiene.  Consider a dental sealant. A dental sealant is a coating material that is applied by your dentist to the pits and grooves of teeth. The sealant prevents food from being trapped in them. It may protect the teeth for several years.  Ask about fluoride supplements if you live in a community without fluorinated water or with water that has a  low fluoride content. Use fluoride supplements as directed by your dentist or health care provider.  Allow fluoride varnish applications to teeth if directed by your dentist or health care provider. Document Released: 03/03/2002 Document Revised: 10/26/2013 Document Reviewed: 06/13/2012 [facility] Patient Information 2015 [place], [place]. This information is not intended to replace advice given to you by your health care provider. Make sure you discuss any questions you have with your health care provider.

## 2014-08-30 IMAGING — CR DG CHEST 2V
2 series · 2 of 2 positions shown · non-contrast
Comparison: 07/29/2012

CLINICAL DATA: Chest pain.  Abdominal pain.  Headache.  Cough.
Shortness of breath.  Smoker.

CHEST - 2 VIEW

[w chest pa]
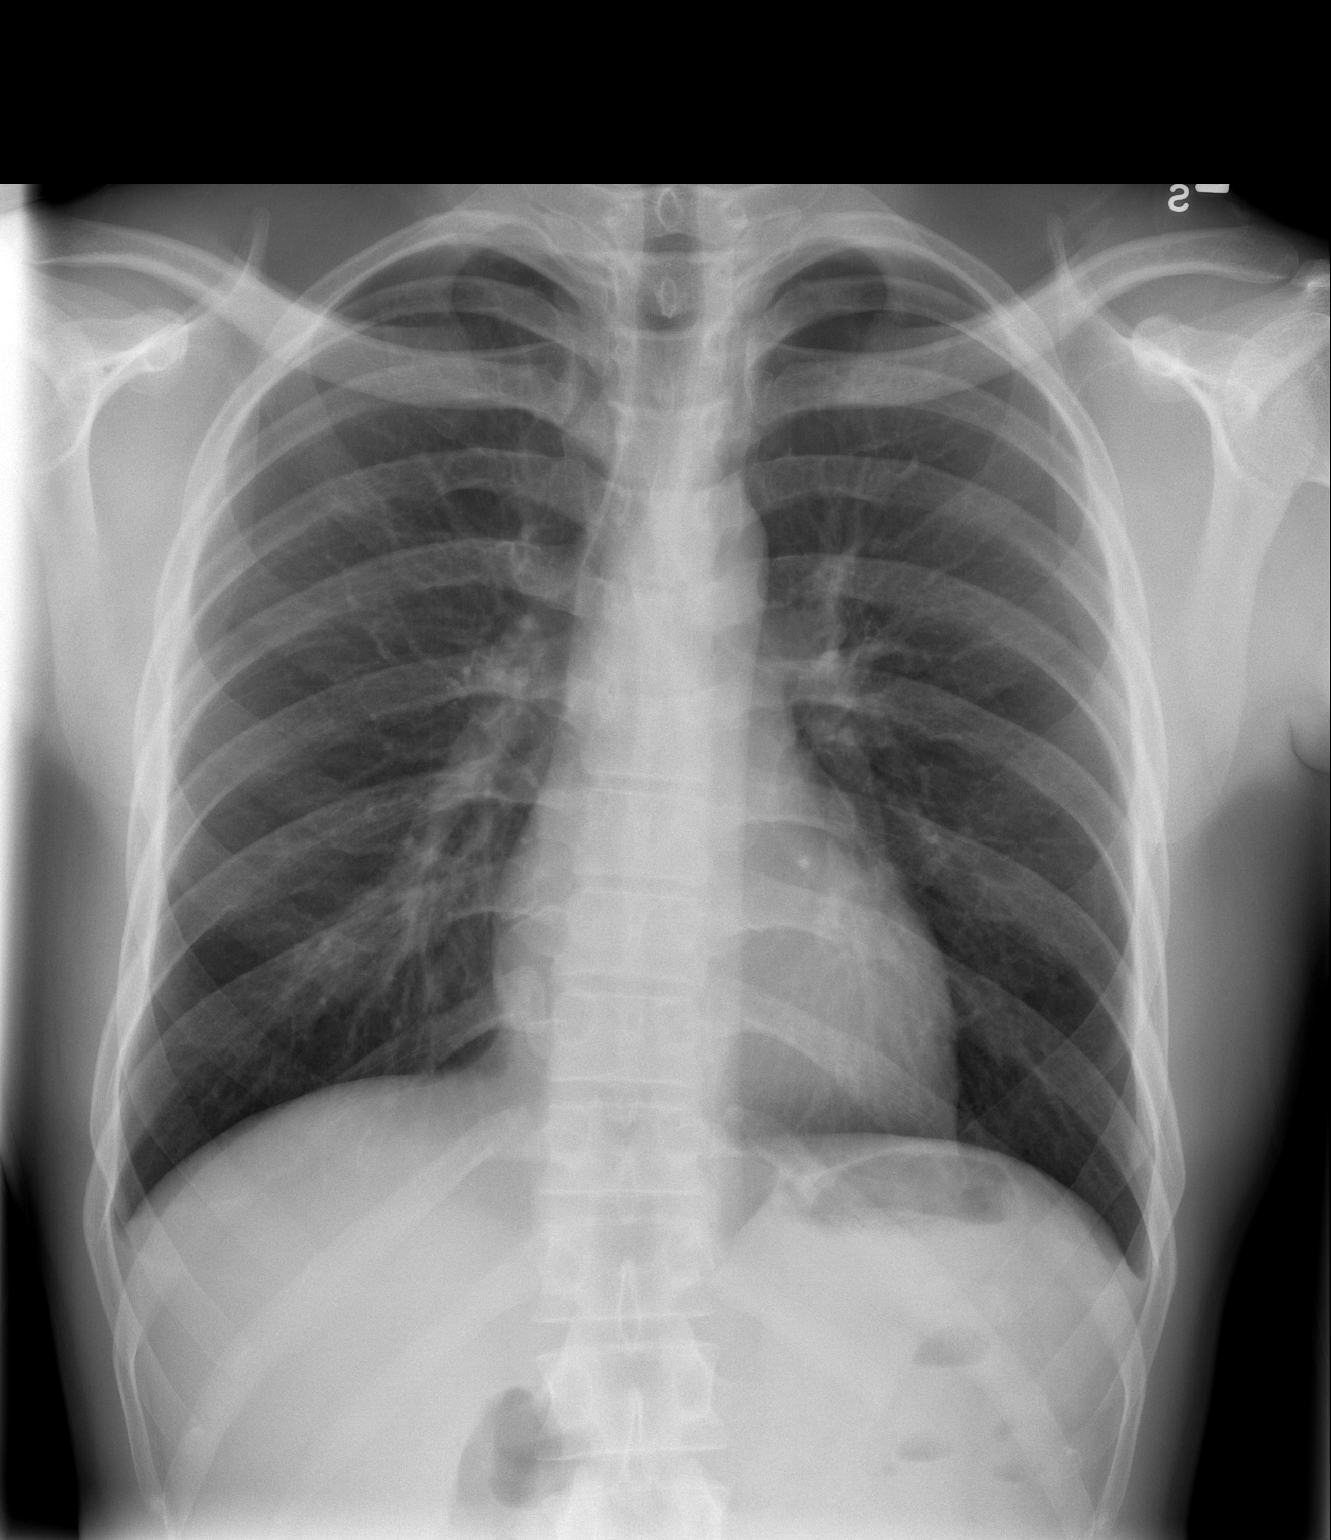

[w chest lat]
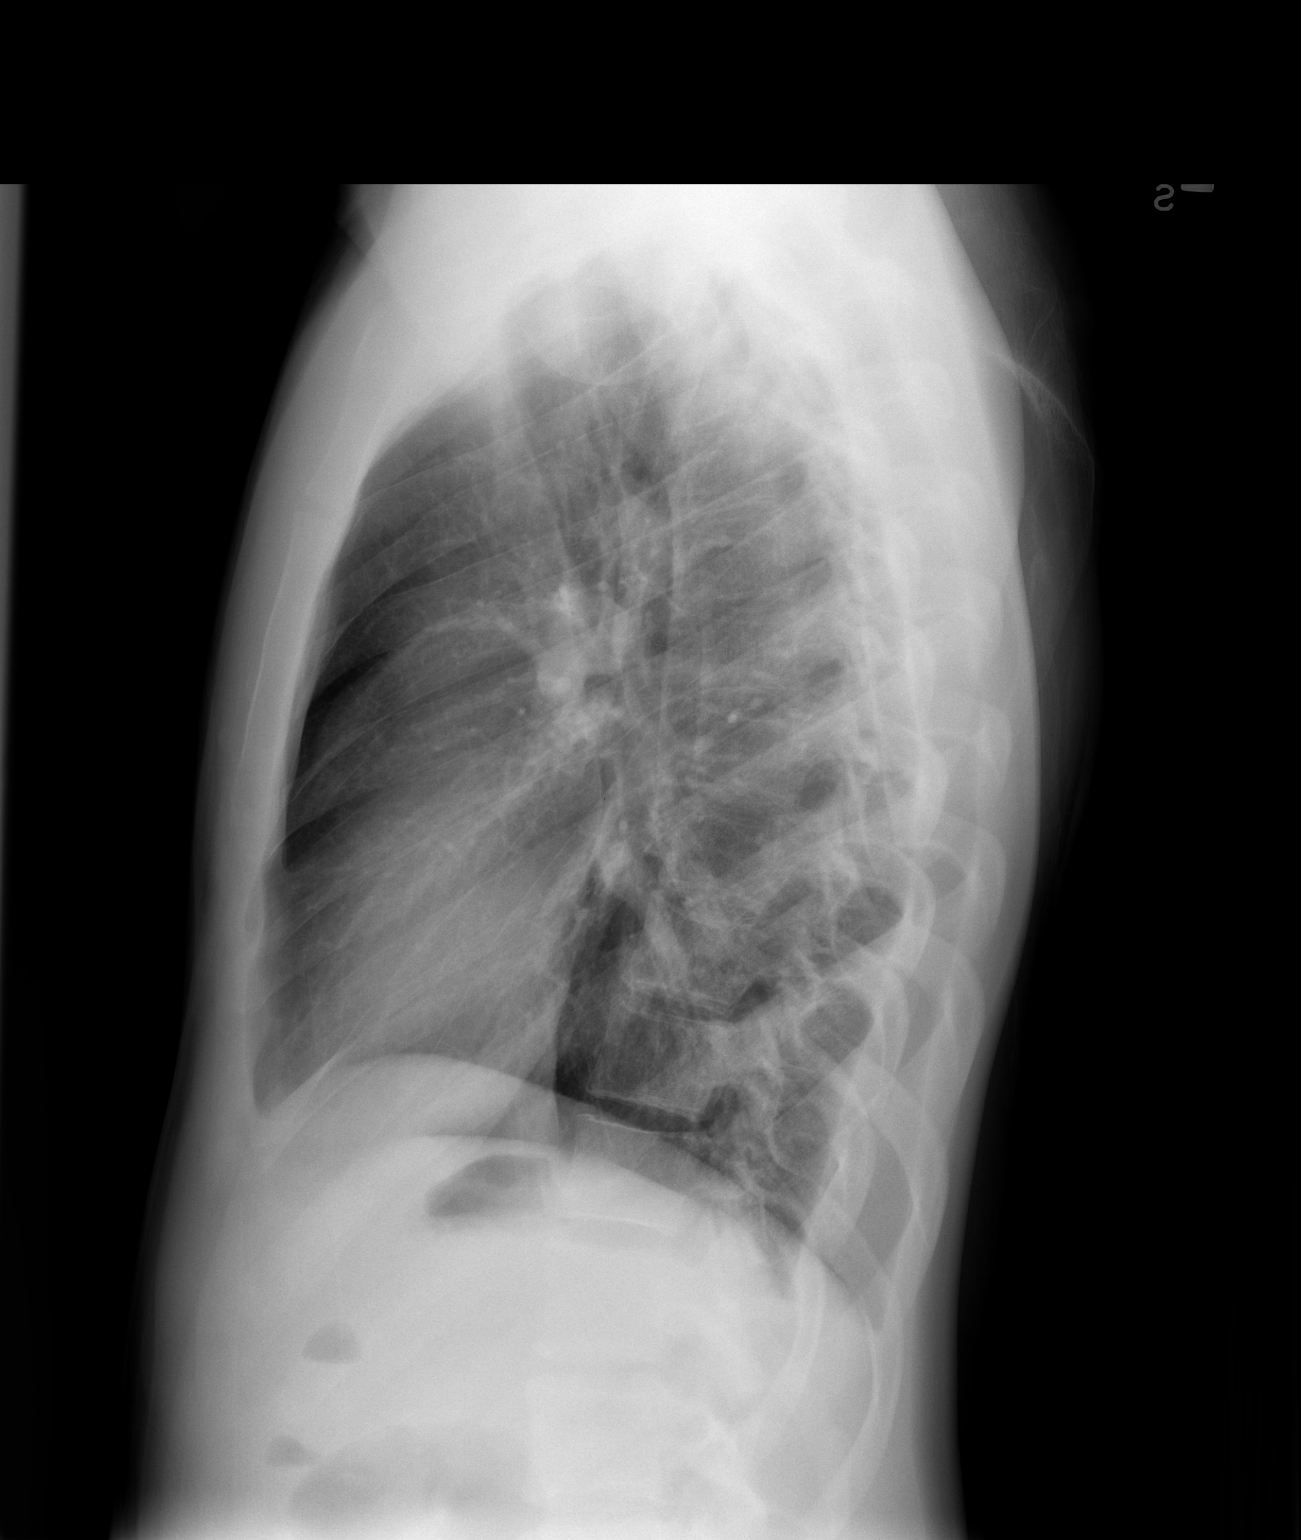

[2 of 2 positions shown; findings below may reference images not displayed]

FINDINGS: Mildly oblique lateral view. Midline trachea.  Normal
heart size and mediastinal contours. No pleural effusion or
pneumothorax.  Clear lungs.
IMPRESSION: No acute cardiopulmonary disease.

## 2015-05-22 ENCOUNTER — Emergency Department (HOSPITAL_COMMUNITY)
Admission: EM | Admit: 2015-05-22 | Discharge: 2015-05-22 | Disposition: A | Discharge status: Home / Self Care | Payer: Self-pay | Attending: Emergency Medicine | Admitting: Emergency Medicine

## 2015-05-22 ENCOUNTER — Encounter (HOSPITAL_COMMUNITY): Payer: Self-pay | Admitting: Emergency Medicine

## 2015-05-22 ENCOUNTER — Emergency Department (HOSPITAL_COMMUNITY): Payer: Self-pay

## 2015-05-22 DIAGNOSIS — W11XXXA Fall on and from ladder, initial encounter: Secondary | ICD-10-CM | POA: Insufficient documentation

## 2015-05-22 DIAGNOSIS — Y9389 Activity, other specified: Secondary | ICD-10-CM | POA: Insufficient documentation

## 2015-05-22 DIAGNOSIS — Y998 Other external cause status: Secondary | ICD-10-CM | POA: Insufficient documentation

## 2015-05-22 DIAGNOSIS — S8992XA Unspecified injury of left lower leg, initial encounter: Secondary | ICD-10-CM | POA: Insufficient documentation

## 2015-05-22 DIAGNOSIS — Y9289 Other specified places as the place of occurrence of the external cause: Secondary | ICD-10-CM | POA: Insufficient documentation

## 2015-05-22 DIAGNOSIS — Z8719 Personal history of other diseases of the digestive system: Secondary | ICD-10-CM | POA: Insufficient documentation

## 2015-05-22 DIAGNOSIS — F17210 Nicotine dependence, cigarettes, uncomplicated: Secondary | ICD-10-CM | POA: Insufficient documentation

## 2015-05-22 MED ORDER — TRAMADOL HCL 50 MG PO TABS: 50.0000 mg | ORAL_TABLET | Freq: Four times a day (QID) | ORAL | Status: DC | PRN

## 2015-05-22 MED ORDER — TRAMADOL HCL 50 MG PO TABS
50.0000 mg | ORAL_TABLET | Freq: Once | ORAL | Status: AC
  Administered 2015-05-22: 50 mg via ORAL
  Filled 2015-05-22: qty 1

## 2015-05-22 MED ORDER — CYCLOBENZAPRINE HCL 5 MG PO TABS: 10.0000 mg | ORAL_TABLET | Freq: Two times a day (BID) | ORAL | Status: DC | PRN

## 2015-05-22 NOTE — ED Notes (Signed)
Made ortho aware of knee immobilizer

## 2015-05-22 NOTE — ED Notes (Signed)
Pt states he landed wrong coming off a ladder 2 days ago and has since had lt knee pain.

## 2015-05-22 NOTE — ED Notes (Signed)
Patient transported to X-ray 

## 2015-05-22 NOTE — ED Notes (Signed)
Patient returned from X-ray 

## 2015-05-22 NOTE — Discharge Instructions (Signed)

## 2015-05-22 NOTE — ED Provider Notes (Signed)
CSN: [id number]     Arrival date & time 05/22/15  1310 History  By signing my name below, I, Freida Busman, attest that this documentation has been prepared under the direction and in the presence of non-physician practitioner, Dorthula Matas, PA-C. Electronically Signed: Freida Busman, Scribe. 05/22/2015. 2:39 PM.  Chief Complaint  Patient presents with  . Knee Pain    The history is provided by the patient. No language interpreter was used.     HPI Comments:  Anthony Wiggins is a 29 y.o. male who presents to the Emergency Department complaining of moderate left knee pain s/p fall ~ 3 days ago. Pt fell from a ladder 3 steps from the bottom; states his LLE slipped and he twisted the knee when he landed. He reports associated swelling at the site. No LOC, head injury no neck injury, or alleviating factors noted. Pt has no other complaints or symptoms at this time.   Past Medical History  Diagnosis Date  . Dental caries    No past surgical history on file. Family History  Problem Relation Age of Onset  . Diabetes Mother   . Hypertension Mother    Social History  Substance Use Topics  . Smoking status: Current Every Day Smoker -- 0.50 packs/day    Types: Cigarettes  . Smokeless tobacco: None  . Alcohol Use: 2.4 oz/week    4 Cans of beer per week    Review of Systems  Constitutional: Negative for fever and chills.  Respiratory: Negative for shortness of breath.   Cardiovascular: Negative for chest pain.  Musculoskeletal: Positive for arthralgias (knee pain).   Allergies  Naproxen  Home Medications   Prior to Admission medications   Medication Sig Start Date End Date Taking? Authorizing Provider  acetaminophen (TYLENOL) 500 MG tablet Take 1,000 mg by mouth every 4 (four) hours as needed for moderate pain.   Yes Historical Provider, MD  acetaminophen-codeine (TYLENOL #3) 300-30 MG per tablet Take 1 tablet by mouth every 6 (six) hours as needed for moderate pain. Patient  not taking: Reported on 05/22/2015 08/24/13   Ivery Quale, PA-C  amoxicillin (AMOXIL) 500 MG capsule Take 1 capsule (500 mg total) by mouth 3 (three) times daily. Patient not taking: Reported on 05/22/2015 05/11/14   Kathrynn Speed, PA-C  HYDROcodone-acetaminophen (NORCO/VICODIN) 5-325 MG per tablet Take 1-2 tablets by mouth every 4 (four) hours as needed. Patient not taking: Reported on 05/22/2015 05/11/14   Robyn M Hess, PA-C   BP 132/78 mmHg  Pulse 77  Temp(Src) 98.2 F (36.8 C) (Oral)  Resp 16  SpO2 100% Physical Exam  Constitutional: He is oriented to person, place, and time. He appears well-developed and well-nourished. No distress.  HENT:  Head: Normocephalic and atraumatic.  Eyes: Conjunctivae are normal.  Cardiovascular: Normal rate.   Pulmonary/Chest: Effort normal.  Abdominal: He exhibits no distension.  Musculoskeletal:       Left knee: He exhibits decreased range of motion. He exhibits no swelling, no effusion, no ecchymosis, no deformity, no laceration, no erythema, normal alignment, no LCL laxity and normal patellar mobility. Tenderness (patellar tendon and lateral joint line) found.  Neurological: He is alert and oriented to person, place, and time.  Skin: Skin is warm and dry.  Psychiatric: He has a normal mood and affect.  Nursing note and vitals reviewed.   ED Course  Procedures   DIAGNOSTIC STUDIES:  Oxygen Saturation is 100% on RA, normal by my interpretation.    COORDINATION OF  CARE:  1:51 PM Will order XR of the knee.  Discussed treatment plan with pt at bedside and pt agreed to plan.  Imaging Review Dg Knee Complete 4 Views Left  05/22/2015  CLINICAL DATA:  Larey SeatFell off ladder 2 days ago with anterior left knee pain since then, inferior to patella EXAM: LEFT KNEE - COMPLETE 4+ VIEW COMPARISON:  02/17/2013 FINDINGS: There is no evidence of fracture, dislocation, or joint effusion. There is no evidence of arthropathy or other focal bone abnormality. Soft  tissues are unremarkable. IMPRESSION: Negative. Electronically Signed   By: Esperanza Heiraymond  Rubner M.D.   On: 05/22/2015 14:24   I have personally reviewed and evaluated these images and lab results as part of my medical decision-making.   MDM   Final diagnoses:  Knee injury, left, initial encounter    Nonacute x-ray of left knee, patient placed in knee immobilizer and says he has crutches at home. Will recommend NSAIDs and muscle relaxers immobilized for 1 week. If not feeling better he will need to follow-up with orthopedic surgeon for rule out ligament injury. No induration, redness, significant swelling or systemic symptoms.  Medications  traMADol (ULTRAM) tablet 50 mg (not administered)    I feel the patient has had an appropriate workup for their chief complaint at this time and likelihood of emergent condition existing is low. Discussed s/sx that warrant return to the ED.  Filed Vitals:   05/22/15 1319  BP: 132/78  Pulse: 77  Temp: 98.2 F (36.8 C)  Resp: 16    I personally performed the services described in this documentation, which was scribed in my presence. The recorded information has been reviewed and is accurate.   Marlon Peliffany Chattie Greeson, PA-C 05/22/15 1449  Laurence Spatesachel Morgan Little, MD 05/30/15 [phone]

## 2016-08-09 ENCOUNTER — Encounter (HOSPITAL_COMMUNITY): Payer: Self-pay | Admitting: Emergency Medicine

## 2016-08-09 ENCOUNTER — Emergency Department (HOSPITAL_COMMUNITY)
Admission: EM | Admit: 2016-08-09 | Discharge: 2016-08-09 | Disposition: A | Discharge status: Home / Self Care | Payer: Self-pay | Attending: Emergency Medicine | Admitting: Emergency Medicine

## 2016-08-09 DIAGNOSIS — F17210 Nicotine dependence, cigarettes, uncomplicated: Secondary | ICD-10-CM | POA: Insufficient documentation

## 2016-08-09 DIAGNOSIS — K0889 Other specified disorders of teeth and supporting structures: Secondary | ICD-10-CM | POA: Insufficient documentation

## 2016-08-09 MED ORDER — ACETAMINOPHEN 500 MG PO TABS
1000.0000 mg | ORAL_TABLET | Freq: Once | ORAL | Status: AC
  Administered 2016-08-09: 1000 mg via ORAL
  Filled 2016-08-09: qty 2

## 2016-08-09 MED ORDER — AMOXICILLIN-POT CLAVULANATE 875-125 MG PO TABS: 1.0000 | ORAL_TABLET | Freq: Two times a day (BID) | ORAL | 0 refills | Status: DC

## 2016-08-09 MED ORDER — HYDROCODONE-ACETAMINOPHEN 5-325 MG PO TABS: 1.0000 | ORAL_TABLET | Freq: Four times a day (QID) | ORAL | 0 refills | Status: DC | PRN

## 2016-08-09 MED ORDER — LIDOCAINE-EPINEPHRINE-TETRACAINE (LET) TOPICAL GEL
3.0000 mL | Freq: Once | TOPICAL | Status: DC
  Filled 2016-08-09 (×2): qty 3

## 2016-08-09 MED ORDER — BUPIVACAINE-EPINEPHRINE (PF) 0.5% -1:200000 IJ SOLN
1.8000 mL | Freq: Once | INTRAMUSCULAR | Status: DC
  Filled 2016-08-09: qty 1.8

## 2016-08-09 MED ORDER — AMOXICILLIN-POT CLAVULANATE 875-125 MG PO TABS
1.0000 | ORAL_TABLET | Freq: Once | ORAL | Status: AC
  Administered 2016-08-09: 1 via ORAL
  Filled 2016-08-09: qty 1

## 2016-08-09 NOTE — ED Provider Notes (Signed)
[facility]-EMERGENCY DEPT Provider Note   CSN: [id number] Arrival date & time: 08/09/16  1447  By signing my name below, I, Teofilo Pod, attest that this documentation has been prepared under the direction and in the presence of Buel Ream, PA-C. Electronically Signed: Teofilo Pod, ED Scribe. 08/09/2016. 5:02 PM.   History   Chief Complaint Chief Complaint  Patient presents with  . Headache    The history is provided by the patient. No language interpreter was used.   HPI Comments:  Anthony Wiggins is a 31 y.o. male who presents to the Emergency Department complaining of a constant facial headache and sinus pressure x 3 days. Pt complains of right sided dental pain that radiates to his right ear and eye. Pt states that he has a cavity on the right side. Pt does not have a dentist. Pt has taken Ora-gel and sudafed with no relief. Pt denies fever, cough, visual changes, chest pain, abdominal pain.    Past Medical History:  Diagnosis Date  . Dental caries     There are no active problems to display for this patient.   History reviewed. No pertinent surgical history.     Home Medications    Prior to Admission medications   Medication Sig Start Date End Date Taking? Authorizing Provider  acetaminophen (TYLENOL) 500 MG tablet Take 1,000 mg by mouth every 4 (four) hours as needed for moderate pain.    Historical Provider, MD  acetaminophen-codeine (TYLENOL #3) 300-30 MG per tablet Take 1 tablet by mouth every 6 (six) hours as needed for moderate pain. Patient not taking: Reported on 05/22/2015 08/24/13   Ivery Quale, PA-C  amoxicillin (AMOXIL) 500 MG capsule Take 1 capsule (500 mg total) by mouth 3 (three) times daily. Patient not taking: Reported on 05/22/2015 05/11/14   Kathrynn Speed, PA-C  amoxicillin-clavulanate (AUGMENTIN) 875-125 MG tablet Take 1 tablet by mouth every 12 (twelve) hours. 08/09/16   Emi Holes, PA-C  cyclobenzaprine (FLEXERIL) 5 MG tablet Take 2  tablets (10 mg total) by mouth 2 (two) times daily as needed for muscle spasms. 05/22/15   Marlon Pel, PA-C  HYDROcodone-acetaminophen (NORCO/VICODIN) 5-325 MG tablet Take 1-2 tablets by mouth every 6 (six) hours as needed. 08/09/16   Emi Holes, PA-C  traMADol (ULTRAM) 50 MG tablet Take 1 tablet (50 mg total) by mouth every 6 (six) hours as needed. 05/22/15   Marlon Pel, PA-C    Family History Family History  Problem Relation Age of Onset  . Diabetes Mother   . Hypertension Mother     Social History Social History  Substance Use Topics  . Smoking status: Current Every Day Smoker    Packs/day: 0.50    Types: Cigarettes  . Smokeless tobacco: Never Used  . Alcohol use 2.4 oz/week    4 Cans of beer per week     Allergies   Naproxen   Review of Systems Review of Systems  Constitutional: Negative for chills and fever.  HENT: Positive for congestion, dental problem, ear pain and sinus pressure. Negative for facial swelling and sore throat.   Eyes: Positive for pain (R). Negative for visual disturbance.  Skin: Negative for rash and wound.  Neurological: Positive for weakness and headaches.  Psychiatric/Behavioral: The patient is not nervous/anxious.      Physical Exam Updated Vital Signs BP (!) 155/101 (BP Location: Left Arm)   Pulse 85   Temp 99 F (37.2 C) (Oral)   Resp 16  Ht 5\' 10"  (1.778 m)   Wt 72.6 kg   SpO2 100%   BMI 22.96 kg/m   Physical Exam  Constitutional: He appears well-developed and well-nourished. No distress.  HENT:  Head: Normocephalic and atraumatic.  Mouth/Throat: Oropharynx is clear and moist. No trismus in the jaw. No dental abscesses. No oropharyngeal exudate, posterior oropharyngeal edema, posterior oropharyngeal erythema or tonsillar abscesses.    Eyes: Conjunctivae are normal. Pupils are equal, round, and reactive to light. Right eye exhibits no discharge. Left eye exhibits no discharge. No scleral icterus.  Neck: Normal  range of motion. Neck supple. No thyromegaly present.  Cardiovascular: Normal rate, regular rhythm, normal heart sounds and intact distal pulses.  Exam reveals no gallop and no friction rub.   No murmur heard. Pulmonary/Chest: Effort normal and breath sounds normal. No stridor. No respiratory distress. He has no wheezes. He has no rales.  Abdominal: Soft. Bowel sounds are normal. He exhibits no distension. There is no tenderness. There is no rebound and no guarding.  Musculoskeletal: He exhibits no edema.  Lymphadenopathy:    He has no cervical adenopathy.  Neurological: He is alert. Coordination normal.  Skin: Skin is warm and dry. No rash noted. He is not diaphoretic. No pallor.  Psychiatric: He has a normal mood and affect.  Nursing note and vitals reviewed.    ED Treatments / Results  DIAGNOSTIC STUDIES:  Oxygen Saturation is 100% on RA, normal by my interpretation.    COORDINATION OF CARE:  4:59 PM Will prescribe antibiotics. Discussed treatment plan with pt at bedside and pt agreed to plan.   Labs (all labs ordered are listed, but only abnormal results are displayed) Labs Reviewed - No data to display  EKG  EKG Interpretation None       Radiology No results found.  Procedures Procedures (including critical care time)  NERVE BLOCK Performed by: Emi HolesAlexandra M Mareesa Gathright Consent: Verbal consent obtained. Required items: required blood products, implants, devices, and special equipment available Time out: Immediately prior to procedure a "time out" was called to verify the correct patient, procedure, equipment, support staff and site/side marked as required.  Indication: dental pain Nerve block body site: Middle superior alveolar nerve   Needle gauge: 27 G Location technique: anatomical landmarks  Local anesthetic: marcaine  Anesthetic total: 1.8 ml  Outcome: pain improved Patient tolerance: Patient tolerated the procedure well with no immediate  complications.   Medications Ordered in ED Medications  amoxicillin-clavulanate (AUGMENTIN) 875-125 MG per tablet 1 tablet (1 tablet Oral Given 08/09/16 1712)  acetaminophen (TYLENOL) tablet 1,000 mg (1,000 mg Oral Given 08/09/16 1712)     Initial Impression / Assessment and Plan / ED Course  I have reviewed the triage vital signs and the nursing notes.  Pertinent labs & imaging results that were available during my care of the patient were reviewed by me and considered in my medical decision making (see chart for details).     Patient with dentalgia. Patient given ibuprofen without relief. Patient given nerve block with moderate relief. No abscess requiring immediate incision and drainage.  Exam not concerning for Ludwig's angina or pharyngeal abscess.  Will treat with Augmentin. Pt instructed to follow-up with dentist as soon as possible.  Discussed return precautions. Patient vitals stable throughout ED course and discharged in satisfactory condition. Patient also evaluated by Dr. Rhunette CroftNanavati who guided the patient's management and agrees with plan.   Final Clinical Impressions(s) / ED Diagnoses   Final diagnoses:  Pain, dental  New Prescriptions Discharge Medication List as of 08/09/2016  7:34 PM    START taking these medications   Details  amoxicillin-clavulanate (AUGMENTIN) 875-125 MG tablet Take 1 tablet by mouth every 12 (twelve) hours., Starting Thu 08/09/2016, Print      I personally performed the services described in this documentation, which was scribed in my presence. The recorded information has been reviewed and is accurate.     Emi Holes, PA-C 08/10/16 0104    [name], MD 08/11/16 [phone]

## 2016-08-09 NOTE — ED Triage Notes (Signed)
Pt to ED with c/o headache, pain behind eyes, facial pain that causes his teeth to hurt

## 2016-08-09 NOTE — ED Notes (Signed)
Patient left at this time with all belongings. 

## 2016-08-09 NOTE — Discharge Instructions (Addendum)
Medications: Augmentin, Norco  Treatment: Take Augmentin twice daily for 1 week. Make sure to finish all of this medication. Take 1-2 Norco every 4-6 hours as needed for severe pain. Continue taking ibuprofen and Tylenol at home as prescribed. You can also take Sudafed as you have for nasal congestion and facial pressure.  Follow-up: Please follow-up with a dentist as soon as possible for further evaluation and treatment of your dental pain. Please refer to the dentists below or the community resources attached. Please return to the emergency department if you develop any new or worsening symptoms including inability to open your mouth, nausea, confusion, seizures, or any other concerning symptoms.

## 2016-08-09 NOTE — ED Triage Notes (Signed)
Unable to locate Dental box.

## 2020-04-04 ENCOUNTER — Ambulatory Visit (INDEPENDENT_AMBULATORY_CARE_PROVIDER_SITE_OTHER): Payer: Self-pay | Admitting: Primary Care

## 2020-04-05 ENCOUNTER — Ambulatory Visit (INDEPENDENT_AMBULATORY_CARE_PROVIDER_SITE_OTHER): Payer: Self-pay | Admitting: Primary Care

## 2020-05-06 ENCOUNTER — Emergency Department (HOSPITAL_COMMUNITY)
Admission: EM | Admit: 2020-05-06 | Discharge: 2020-05-07 | Disposition: A | Discharge status: Other Acute Inpatient Hospital | Payer: Self-pay | Attending: Emergency Medicine | Admitting: Emergency Medicine

## 2020-05-06 ENCOUNTER — Encounter (HOSPITAL_COMMUNITY): Payer: Self-pay | Admitting: *Deleted

## 2020-05-06 ENCOUNTER — Other Ambulatory Visit: Payer: Self-pay

## 2020-05-06 DIAGNOSIS — Z20822 Contact with and (suspected) exposure to covid-19: Secondary | ICD-10-CM | POA: Insufficient documentation

## 2020-05-06 DIAGNOSIS — F209 Schizophrenia, unspecified: Secondary | ICD-10-CM | POA: Insufficient documentation

## 2020-05-06 DIAGNOSIS — F29 Unspecified psychosis not due to a substance or known physiological condition: Secondary | ICD-10-CM | POA: Insufficient documentation

## 2020-05-06 DIAGNOSIS — Z046 Encounter for general psychiatric examination, requested by authority: Secondary | ICD-10-CM | POA: Insufficient documentation

## 2020-05-06 DIAGNOSIS — F1520 Other stimulant dependence, uncomplicated: Secondary | ICD-10-CM | POA: Insufficient documentation

## 2020-05-06 DIAGNOSIS — F1420 Cocaine dependence, uncomplicated: Secondary | ICD-10-CM | POA: Insufficient documentation

## 2020-05-06 DIAGNOSIS — F1410 Cocaine abuse, uncomplicated: Secondary | ICD-10-CM

## 2020-05-06 DIAGNOSIS — F17210 Nicotine dependence, cigarettes, uncomplicated: Secondary | ICD-10-CM | POA: Insufficient documentation

## 2020-05-06 DIAGNOSIS — F1510 Other stimulant abuse, uncomplicated: Secondary | ICD-10-CM

## 2020-05-06 HISTORY — DX: Schizophrenia, unspecified: F20.9

## 2020-05-06 LAB — CBC WITH DIFFERENTIAL/PLATELET
Abs Immature Granulocytes: 0.07 10*3/uL (ref 0.00–0.07)
Basophils Absolute: 0 10*3/uL (ref 0.0–0.1)
Basophils Relative: 0 %
Eosinophils Absolute: 0 10*3/uL (ref 0.0–0.5)
Eosinophils Relative: 0 %
HCT: 36.3 % — ABNORMAL LOW (ref 39.0–52.0)
Hemoglobin: 12.1 g/dL — ABNORMAL LOW (ref 13.0–17.0)
Immature Granulocytes: 1 %
Lymphocytes Relative: 8 %
Lymphs Abs: 1.1 10*3/uL (ref 0.7–4.0)
MCH: 26.5 pg (ref 26.0–34.0)
MCHC: 33.3 g/dL (ref 30.0–36.0)
MCV: 79.4 fL — ABNORMAL LOW (ref 80.0–100.0)
Monocytes Absolute: 0.6 10*3/uL (ref 0.1–1.0)
Monocytes Relative: 4 %
Neutro Abs: 11.3 10*3/uL — ABNORMAL HIGH (ref 1.7–7.7)
Neutrophils Relative %: 87 %
Platelets: 263 10*3/uL (ref 150–400)
RBC: 4.57 MIL/uL (ref 4.22–5.81)
RDW: 13.3 % (ref 11.5–15.5)
WBC: 13 10*3/uL — ABNORMAL HIGH (ref 4.0–10.5)
nRBC: 0 % (ref 0.0–0.2)

## 2020-05-06 LAB — ETHANOL: Alcohol, Ethyl (B): <10 mg/dL (ref <10)

## 2020-05-06 LAB — COMPREHENSIVE METABOLIC PANEL
ALT: 22 U/L (ref 0–44)
AST: 37 U/L (ref 15–41)
Albumin: 4.2 g/dL (ref 3.5–5.0)
Alkaline Phosphatase: 51 U/L (ref 38–126)
Anion gap: 15 (ref 5–15)
BUN: 12 mg/dL (ref 6–20)
CO2: 21 mmol/L — ABNORMAL LOW (ref 22–32)
Calcium: 8.9 mg/dL (ref 8.9–10.3)
Chloride: 104 mmol/L (ref 98–111)
Creatinine, Ser: 1.52 mg/dL — ABNORMAL HIGH (ref 0.61–1.24)
GFR, Estimated: >60 mL/min (ref >60)
Glucose, Bld: 99 mg/dL (ref 70–99)
Potassium: 2.9 mmol/L — ABNORMAL LOW (ref 3.5–5.1)
Sodium: 140 mmol/L (ref 135–145)
Total Bilirubin: 0.7 mg/dL (ref 0.3–1.2)
Total Protein: 7.9 g/dL (ref 6.5–8.1)

## 2020-05-06 LAB — SALICYLATE LEVEL: Salicylate Lvl: <7 mg/dL — ABNORMAL LOW (ref 7.0–30.0)

## 2020-05-06 LAB — ACETAMINOPHEN LEVEL: Acetaminophen (Tylenol), Serum: <10 ug/mL — ABNORMAL LOW (ref 10–30)

## 2020-05-06 MED ORDER — ZIPRASIDONE MESYLATE 20 MG IM SOLR
INTRAMUSCULAR | Status: AC
  Administered 2020-05-06: 20 mg via INTRAMUSCULAR
  Filled 2020-05-06: qty 20

## 2020-05-06 MED ORDER — ZIPRASIDONE MESYLATE 20 MG IM SOLR: 20.0000 mg | Freq: Once | INTRAMUSCULAR | Status: AC

## 2020-05-06 MED ORDER — STERILE WATER FOR INJECTION IJ SOLN
INTRAMUSCULAR | Status: AC
  Filled 2020-05-06: qty 10

## 2020-05-06 NOTE — ED Notes (Signed)
Pt's ankle restraints were removed at the permission of RN. Pt understands that if he begins acting out again they will be reapplied.

## 2020-05-06 NOTE — ED Triage Notes (Signed)
IVC papers taken out on pt by mother who is concerned for the patient's mental state and safety. Pt with history of schizophrenia but reportedly not taken any medications at this time. Pt escorted by [employer].

## 2020-05-06 NOTE — ED Provider Notes (Signed)
[facility]-EMERGENCY DEPT Provider Note   CSN: [id number] Arrival date & time: 05/06/20  2201     History Chief Complaint  Patient presents with   IVC    LEVEL 5 CAVEAT 2/2 PSYCHOSIS  Anthony Wiggins is a 34 y.o. male.  34 y/o male with reported hx of schizophrenia presenting under IVC taken out by mother. IVC references patient being noncompliant with his psychiatric medications. Apparently has hx of hospitalization at [facility] in the 90's for psychosis.        Past Medical History:  Diagnosis Date   Dental caries    Schizophrenia (HCC)     There are no problems to display for this patient.   History reviewed. No pertinent surgical history.     Family History  Problem Relation Age of Onset   Diabetes Mother    Hypertension Mother     Social History   Tobacco Use   Smoking status: Current Every Day Smoker    Packs/day: 0.50    Types: Cigarettes   Smokeless tobacco: Never Used  Substance Use Topics   Alcohol use: Yes    Alcohol/week: 4.0 standard drinks    Types: 4 Cans of beer per week   Drug use: No    Home Medications Prior to Admission medications   Medication Sig Start Date End Date Taking? Authorizing Provider  acetaminophen (TYLENOL) 500 MG tablet Take 1,000 mg by mouth every 4 (four) hours as needed for moderate pain. Patient not taking: Reported on 05/06/2020    [provider]  acetaminophen-codeine (TYLENOL #3) 300-30 MG per tablet Take 1 tablet by mouth every 6 (six) hours as needed for moderate pain. Patient not taking: Reported on 05/22/2015 08/24/13   Ivery Quale, PA-C  amoxicillin (AMOXIL) 500 MG capsule Take 1 capsule (500 mg total) by mouth 3 (three) times daily. Patient not taking: Reported on 05/22/2015 05/11/14   Kathrynn Speed, PA-C  amoxicillin-clavulanate (AUGMENTIN) 875-125 MG tablet Take 1 tablet by mouth every 12 (twelve) hours. Patient not taking: Reported on 05/06/2020 08/09/16   Emi Holes, PA-C  cyclobenzaprine (FLEXERIL) 5 MG tablet Take 2 tablets (10 mg total) by mouth 2 (two) times daily as needed for muscle spasms. Patient not taking: Reported on 05/06/2020 05/22/15   Marlon Pel, PA-C  HYDROcodone-acetaminophen (NORCO/VICODIN) 5-325 MG tablet Take 1-2 tablets by mouth every 6 (six) hours as needed. Patient not taking: Reported on 05/06/2020 08/09/16   Emi Holes, PA-C  traMADol (ULTRAM) 50 MG tablet Take 1 tablet (50 mg total) by mouth every 6 (six) hours as needed. Patient not taking: Reported on 05/06/2020 05/22/15   Marlon Pel, PA-C    Allergies    Naproxen  Review of Systems   Review of Systems  Unable to perform ROS: Psychiatric disorder    Physical Exam Updated Vital Signs BP (!) 143/88 (BP Location: Left Arm)    Pulse (!) 103    Temp 97.9 F (36.6 C) (Oral)    Resp (!) 28    SpO2 98%   Physical Exam Vitals and nursing note reviewed.  Constitutional:      Appearance: He is well-developed. He is diaphoretic.     Comments: Uncooperative, hyperactive.  HENT:     Head: Normocephalic and atraumatic.  Eyes:     General: No scleral icterus.    Conjunctiva/sclera: Conjunctivae normal.  Pulmonary:     Effort: Pulmonary effort is normal. No respiratory distress.     Comments: Tachypnea without  dyspnea Musculoskeletal:        General: Normal range of motion.     Cervical back: Normal range of motion.  Skin:    General: Skin is warm.     Coloration: Skin is not pale.     Findings: No erythema or rash.  Psychiatric:        Mood and Affect: Mood is anxious.        Speech: Speech is rapid and pressured.        Behavior: Behavior is agitated and hyperactive.        Judgment: Judgment is impulsive.     Comments: Patient not able to be redirected. States that he is hot and is going to "blow up" and that he is "on fire".     ED Results / Procedures / Treatments   Labs (all labs ordered are listed, but only abnormal results are  displayed) Labs Reviewed  CBC WITH DIFFERENTIAL/PLATELET - Abnormal; Notable for the following components:      Result Value   WBC 13.0 (*)    Hemoglobin 12.1 (*)    HCT 36.3 (*)    MCV 79.4 (*)    Neutro Abs 11.3 (*)    All other components within normal limits  COMPREHENSIVE METABOLIC PANEL - Abnormal; Notable for the following components:   Potassium 2.9 (*)    CO2 21 (*)    Creatinine, Ser 1.52 (*)    All other components within normal limits  ACETAMINOPHEN LEVEL - Abnormal; Notable for the following components:   Acetaminophen (Tylenol), Serum <10 (*)    All other components within normal limits  SALICYLATE LEVEL - Abnormal; Notable for the following components:   Salicylate Lvl <7.0 (*)    All other components within normal limits  RAPID URINE DRUG SCREEN, HOSP PERFORMED - Abnormal; Notable for the following components:   Opiates NEGATIVE (*)    Cocaine POSITIVE (*)    Benzodiazepines NEGATIVE (*)    Amphetamines POSITIVE (*)    Tetrahydrocannabinol NEGATIVE (*)    Barbiturates NEGATIVE (*)    All other components within normal limits  RESPIRATORY PANEL BY RT PCR (FLU A&B, COVID)  ETHANOL    EKG None  Radiology No results found.  Procedures Procedures (including critical care time)  Medications Ordered in ED Medications  ziprasidone (GEODON) injection 20 mg (20 mg Intramuscular Given 05/06/20 2214)  sterile water (preservative free) injection (  Given 05/06/20 2213)  potassium chloride SA (KLOR-CON) CR tablet 40 mEq (40 mEq Oral Given 05/07/20 0525)    ED Course  I have reviewed the triage vital signs and the nursing notes.  Pertinent labs & imaging results that were available during my care of the patient were reviewed by me and considered in my medical decision making (see chart for details).  Clinical Course as of May 07 618  Caleen Essex May 06, 2020  2216 Attempted to contact [name] (mother), who took out IVC on patient, without success.   [KH]    2224 Mother called back to the hospital. States the patient thinks someone is trying to hurt and kill him. Felt there was a bomb in the police car today. Supposed to be on Haldol and Risperdal. Got call from mother of his child stating his symptom were worsening today. Last psychosis episode was ~3 years ago, but hasn't followed with a doctor for at least 5 years. Mother unaware of any illicit substance use; knows he does drink ETOH on occasion but unsure of  how much.   [KH]  Sat May 07, 2020  0505 Patient accepted to [facility] for ongoing psychiatric care. Accepted to service of Dr. Cindi Carbon. Morning AC to call when bed available for transfer.   [KH]    Clinical Course User Index [KH] Darylene Price   MDM Rules/Calculators/A&P                          34 year old male presents to the emergency department for psychiatric evaluation.  IVC taken out by mother.  He has been medically cleared and evaluated by TTS who recommend inpatient treatment.  Accepted to [facility] for ongoing psychiatric care.  Plan for transfer later this morning.   Final Clinical Impression(s) / ED Diagnoses Final diagnoses:  Psychosis, unspecified psychosis type (HCC)  Cocaine abuse (HCC)  Amphetamine abuse (HCC)  Involuntary commitment    Rx / DC Orders ED Discharge Orders    None       Antony Madura, PA-C 05/07/20 1610    Sabas Sous, MD 05/07/20 2203

## 2020-05-07 ENCOUNTER — Encounter (HOSPITAL_COMMUNITY): Payer: Self-pay | Admitting: Student in an Organized Health Care Education/Training Program

## 2020-05-07 ENCOUNTER — Inpatient Hospital Stay (HOSPITAL_COMMUNITY)
Admission: AD | Admit: 2020-05-07 | Discharge: 2020-05-09 | DRG: 897 | Disposition: A | Discharge status: Home / Self Care | Payer: Federal, State, Local not specified - Other | Source: Intra-hospital | Attending: Psychiatry | Admitting: Psychiatry

## 2020-05-07 ENCOUNTER — Other Ambulatory Visit: Payer: Self-pay

## 2020-05-07 DIAGNOSIS — F1510 Other stimulant abuse, uncomplicated: Secondary | ICD-10-CM | POA: Diagnosis present

## 2020-05-07 DIAGNOSIS — I10 Essential (primary) hypertension: Secondary | ICD-10-CM | POA: Diagnosis present

## 2020-05-07 DIAGNOSIS — F14259 Cocaine dependence with cocaine-induced psychotic disorder, unspecified: Secondary | ICD-10-CM | POA: Diagnosis present

## 2020-05-07 DIAGNOSIS — R000 Tachycardia, unspecified: Secondary | ICD-10-CM | POA: Diagnosis present

## 2020-05-07 DIAGNOSIS — F19959 Other psychoactive substance use, unspecified with psychoactive substance-induced psychotic disorder, unspecified: Secondary | ICD-10-CM | POA: Diagnosis present

## 2020-05-07 DIAGNOSIS — F17210 Nicotine dependence, cigarettes, uncomplicated: Secondary | ICD-10-CM | POA: Diagnosis present

## 2020-05-07 DIAGNOSIS — F32A Depression, unspecified: Secondary | ICD-10-CM | POA: Diagnosis present

## 2020-05-07 DIAGNOSIS — F14959 Cocaine use, unspecified with cocaine-induced psychotic disorder, unspecified: Secondary | ICD-10-CM | POA: Diagnosis present

## 2020-05-07 DIAGNOSIS — F17200 Nicotine dependence, unspecified, uncomplicated: Secondary | ICD-10-CM | POA: Diagnosis present

## 2020-05-07 DIAGNOSIS — F14251 Cocaine dependence with cocaine-induced psychotic disorder with hallucinations: Secondary | ICD-10-CM | POA: Diagnosis not present

## 2020-05-07 DIAGNOSIS — R030 Elevated blood-pressure reading, without diagnosis of hypertension: Secondary | ICD-10-CM | POA: Diagnosis present

## 2020-05-07 LAB — RAPID URINE DRUG SCREEN, HOSP PERFORMED
Amphetamines: POSITIVE — AB
Barbiturates: NEGATIVE — AB
Benzodiazepines: NEGATIVE — AB
Cocaine: POSITIVE — AB
Opiates: NEGATIVE — AB
Tetrahydrocannabinol: NEGATIVE — AB

## 2020-05-07 LAB — RESPIRATORY PANEL BY RT PCR (FLU A&B, COVID)
Influenza A by PCR: NEGATIVE
Influenza B by PCR: NEGATIVE
SARS Coronavirus 2 by RT PCR: NEGATIVE

## 2020-05-07 MED ORDER — MAGNESIUM HYDROXIDE 400 MG/5ML PO SUSP: 30.0000 mL | Freq: Every day | ORAL | Status: DC | PRN

## 2020-05-07 MED ORDER — HALOPERIDOL 5 MG PO TABS: 5.0000 mg | ORAL_TABLET | Freq: Three times a day (TID) | ORAL | Status: DC | PRN

## 2020-05-07 MED ORDER — METOPROLOL SUCCINATE ER 25 MG PO TB24
25.0000 mg | ORAL_TABLET | Freq: Every day | ORAL | Status: DC
  Administered 2020-05-07 – 2020-05-08 (×2): 25 mg via ORAL
  Filled 2020-05-07 (×5): qty 1

## 2020-05-07 MED ORDER — OLANZAPINE 10 MG PO TABS
10.0000 mg | ORAL_TABLET | Freq: Every day | ORAL | Status: DC
  Administered 2020-05-07 – 2020-05-08 (×2): 10 mg via ORAL
  Filled 2020-05-07 (×3): qty 1

## 2020-05-07 MED ORDER — POTASSIUM CHLORIDE CRYS ER 20 MEQ PO TBCR
40.0000 meq | EXTENDED_RELEASE_TABLET | Freq: Two times a day (BID) | ORAL | Status: AC
  Administered 2020-05-07 (×2): 40 meq via ORAL
  Filled 2020-05-07 (×4): qty 2

## 2020-05-07 MED ORDER — HALOPERIDOL LACTATE 5 MG/ML IJ SOLN: 5.0000 mg | Freq: Three times a day (TID) | INTRAMUSCULAR | Status: DC | PRN

## 2020-05-07 MED ORDER — POTASSIUM CHLORIDE CRYS ER 20 MEQ PO TBCR
40.0000 meq | EXTENDED_RELEASE_TABLET | Freq: Once | ORAL | Status: AC
  Administered 2020-05-07: 40 meq via ORAL
  Filled 2020-05-07: qty 2

## 2020-05-07 MED ORDER — NICOTINE 21 MG/24HR TD PT24
21.0000 mg | MEDICATED_PATCH | Freq: Every day | TRANSDERMAL | Status: DC
  Administered 2020-05-07 – 2020-05-09 (×3): 21 mg via TRANSDERMAL
  Filled 2020-05-07 (×6): qty 1

## 2020-05-07 MED ORDER — ALUM & MAG HYDROXIDE-SIMETH 200-200-20 MG/5ML PO SUSP: 30.0000 mL | ORAL | Status: DC | PRN

## 2020-05-07 MED ORDER — DIPHENHYDRAMINE HCL 25 MG PO CAPS: 50.0000 mg | ORAL_CAPSULE | Freq: Four times a day (QID) | ORAL | Status: DC | PRN

## 2020-05-07 MED ORDER — ZIPRASIDONE MESYLATE 20 MG IM SOLR: 20.0000 mg | INTRAMUSCULAR | Status: DC | PRN

## 2020-05-07 MED ORDER — ACETAMINOPHEN 325 MG PO TABS
650.0000 mg | ORAL_TABLET | Freq: Four times a day (QID) | ORAL | Status: DC | PRN
  Administered 2020-05-07: 650 mg via ORAL
  Filled 2020-05-07: qty 2

## 2020-05-07 MED ORDER — DIPHENHYDRAMINE HCL 50 MG/ML IJ SOLN: 50.0000 mg | Freq: Four times a day (QID) | INTRAMUSCULAR | Status: DC | PRN

## 2020-05-07 MED ORDER — LORAZEPAM 1 MG PO TABS: 2.0000 mg | ORAL_TABLET | Freq: Four times a day (QID) | ORAL | Status: DC | PRN

## 2020-05-07 MED ORDER — LORAZEPAM 2 MG/ML IJ SOLN: 2.0000 mg | Freq: Four times a day (QID) | INTRAMUSCULAR | Status: DC | PRN

## 2020-05-07 MED ORDER — LORAZEPAM 1 MG PO TABS: 1.0000 mg | ORAL_TABLET | ORAL | Status: DC | PRN

## 2020-05-07 MED ORDER — OLANZAPINE 10 MG PO TBDP: 10.0000 mg | ORAL_TABLET | Freq: Three times a day (TID) | ORAL | Status: DC | PRN

## 2020-05-07 MED ORDER — NICOTINE 21 MG/24HR TD PT24
21.0000 mg | MEDICATED_PATCH | Freq: Every day | TRANSDERMAL | Status: DC
  Administered 2020-05-07: 21 mg via TRANSDERMAL
  Filled 2020-05-07: qty 1

## 2020-05-07 NOTE — [facility] Group Notes (Signed)
  [facility] LCSW Group Therapy Note  Date/Time:  05/07/2020 11:15AM-12:00PM  Type of Therapy and Topic:  Group Therapy:  Avoiding Rehospitalization  Participation Level:  Minimal   Description of Group This process group involved patients discussing their plans related to taking care of themselves at discharge in order to avoid, if at all possible, being rehospitalized in the future.  The group brainstormed specific ways in which they will be able to implement these plans.    Therapeutic Goals 1. Patient will identify and describe 2-3 specific tasks they plan to undertake in order to maximize their wellness and avoid coming back to a hospital. 2. Patient will verbalize benefits of each task described, as well as barriers to implementation. 3. Patients will brainstorm ways to implement their plans.  Summary of Patient Progress:  The patient expressed his plans for self-care at discharge include relaxing with leisure activities such as fishing and playing basketball.  The way in which this will help is to make him happy and relaxed.  The patient participated minimally with an affect that was blunted and sleepy.  Therapeutic Modalities Stages of Change theory Motivational Interviewing    Anthony Mantle, LCSW 05/07/2020, 11:57 AM

## 2020-05-07 NOTE — Progress Notes (Signed)
Patient admitted to the adult unit from [facility] under IVC. He reported that he was IVC'd because he "talks more than he listens." He reported that he was "wigging out." He reported that he lost it and didn't want to hurt anyone. He reported that he couldn't answer anymore questions because he was sleepy. He reported that he was given some medications this morning and felt sleepy. He denied auditory and visual hallucinations. He denied suicidal and homicidal ideations. He reported using THC occasionally, "once in a while" and Ice (meth) for the first time yesterday.

## 2020-05-07 NOTE — ED Notes (Signed)
Patient sitting on stool, cooperative at this time. 2 bags of labeled patient belongings noted in designated cabinet.

## 2020-05-07 NOTE — ED Notes (Signed)
Attempted to call report to [facility], per specified time, Rodell Perna, RN is busy and requests that I call again later.

## 2020-05-07 NOTE — Progress Notes (Signed)
   05/07/20 2020  COVID-19 Daily Checkoff  Have you had a fever (temp > 37.80C/100F)  in the past 24 hours?  No  If you have had runny nose, nasal congestion, sneezing in the past 24 hours, has it worsened? No  COVID-19 EXPOSURE  Have you traveled outside the state in the past 14 days? No  Have you been in contact with someone with a confirmed diagnosis of COVID-19 or PUI in the past 14 days without wearing appropriate PPE? No  Have you been living in the same home as a person with confirmed diagnosis of COVID-19 or a PUI (household contact)? No  Have you been diagnosed with COVID-19? No

## 2020-05-07 NOTE — H&P (Addendum)
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 And because of this he said some things that his mother took out of context and that's why she had him IVC'd. He reports no SI/HI, or AVH.   Per IVC petition by mother patient said that someone was after him and he was afraid to go into his room, and that there was a bomb in the police car. According to patients mother he had a similar episode about 3 yrs ago and that the last time he saw a psychiatrist was about 5 yrs ago. He was hospitalized at [facility] in the 90's.  UDS obtained in the ED was positive for Cocaine and Amphetamines. On questioning he reports that he does smoke crack cocaine about 2-3 times a week and his last use must have been cut with something. He was extremely agitated and aggressive in the ED and required medication and physical restraints to protect himself and others.   Associated Signs/Symptoms: Depression Symptoms:  loss of energy/fatigue, Duration of Depression Symptoms: No data recorded (Hypo) Manic Symptoms:  None Anxiety Symptoms:  None Psychotic Symptoms:  On presentation to the ED: Agitation, Aggression, and Paranoia Duration of Psychotic Symptoms: No data recorded PTSD Symptoms: NA Total Time spent with patient: 20 minutes  Past Psychiatric History: Hospitalized at [facility] in the 90's   Is the patient at risk to self? No.  Has the patient been a risk to self in the past 6 months? Yes.    Has the patient been a risk to self within the distant past? No.  Is the patient a risk to others? No.  Has the patient been a risk  to others in the past 6 months? Yes.    Has the patient been a risk to others within the distant past? No.   Prior Inpatient Therapy:   Prior Outpatient Therapy:    Alcohol Screening: 1. How often do you have a drink containing alcohol?: 2 to 4 times a month 2. How many drinks containing alcohol do you have on a typical day when you are drinking?: 1 or 2 3. How often do you have six or more drinks on one occasion?: Never AUDIT-C Score: 2 9. Have you or someone else been injured as a result of your drinking?: No 10. Has a relative or friend or a doctor or another health worker been concerned about your drinking or suggested you cut down?: No Alcohol Use Disorder Identification Test Final Score (AUDIT): 2 Alcohol Brief Interventions/Follow-up: AUDIT Score <7 follow-up not indicated Substance Abuse History in the last 12 months:  Yes.   Consequences of Substance Abuse: Aggitation, Aggression, and Paranoia Previous Psychotropic Medications: No  Psychological Evaluations: Yes  Past Medical History:  Past Medical History:  Diagnosis Date  . Dental caries   . Schizophrenia (HCC)    History reviewed. No pertinent surgical history. Family History:  Family History  Problem Relation Age of Onset  . Diabetes Mother   . Hypertension Mother    Family Psychiatric  History: No known Tobacco Screening: Have you used any form of tobacco in the last 30 days? (Cigarettes, Smokeless Tobacco, Cigars, and/or Pipes): Yes Tobacco use, Select all that  apply: 5 or more cigarettes per day Are you interested in Tobacco Cessation Medications?: Yes, will notify MD for an order Counseled patient on smoking cessation including recognizing danger situations, developing coping skills and basic information about quitting provided: Yes Social History:  Social History   Substance and Sexual Activity  Alcohol Use Yes  . Alcohol/week: 4.0 standard drinks  . Types: 4 Cans of beer per week     Social History    Substance and Sexual Activity  Drug Use No    Additional Social History:                           Allergies:   Allergies  Allergen Reactions  . Naproxen Rash   Lab Results:  Results for orders placed or performed during the hospital encounter of 05/06/20 (from the past 48 hour(s))  Rapid urine drug screen (hospital performed)     Status: Abnormal   Collection Time: 05/06/20 10:16 PM  Result Value Ref Range   Opiates NEGATIVE (A) NONE DETECTED   Cocaine POSITIVE (A) NONE DETECTED   Benzodiazepines NEGATIVE (A) NONE DETECTED   Amphetamines POSITIVE (A) NONE DETECTED   Tetrahydrocannabinol NEGATIVE (A) NONE DETECTED   Barbiturates NEGATIVE (A) NONE DETECTED    Comment: Performed at [facility], [address] [place], [place] [address]  CBC with Differential     Status: Abnormal   Collection Time: 05/06/20 11:15 PM  Result Value Ref Range   WBC 13.0 (H) 4.0 - 10.5 K/uL   RBC 4.57 4.22 - 5.81 MIL/uL   Hemoglobin 12.1 (L) 13.0 - 17.0 g/dL   HCT 52.836.3 (L) 39 - 52 %   MCV 79.4 (L) 80.0 - 100.0 fL   MCH 26.5 26.0 - 34.0 pg   MCHC 33.3 30.0 - 36.0 g/dL   RDW 41.313.3 24.411.5 - 01.015.5 %   Platelets 263 150 - 400 K/uL   nRBC 0.0 0.0 - 0.2 %   Neutrophils Relative % 87 %   Neutro Abs 11.3 (H) 1.7 - 7.7 K/uL   Lymphocytes Relative 8 %   Lymphs Abs 1.1 0.7 - 4.0 K/uL   Monocytes Relative 4 %   Monocytes Absolute 0.6 0.1 - 1.0 K/uL   Eosinophils Relative 0 %   Eosinophils Absolute 0.0 0.0 - 0.5 K/uL   Basophils Relative 0 %   Basophils Absolute 0.0 0.0 - 0.1 K/uL   Immature Granulocytes 1 %   Abs Immature Granulocytes 0.07 0.00 - 0.07 K/uL    Comment: Performed at [facility], [address] [place], [place] [address]  Comprehensive metabolic panel     Status: Abnormal   Collection Time: 05/06/20 11:15 PM  Result Value Ref Range   Sodium 140 135 - 145 mmol/L   Potassium 2.9 (L) 3.5 - 5.1 mmol/L   Chloride 104 98 - 111 mmol/L    CO2 21 (L) 22 - 32 mmol/L   Glucose, Bld 99 70 - 99 mg/dL    Comment: Glucose reference range applies only to samples taken after fasting for at least 8 hours.   BUN 12 6 - 20 mg/dL   Creatinine, Ser 6.641.52 (H) 0.61 - 1.24 mg/dL   Calcium 8.9 8.9 - 40.310.3 mg/dL   Total Protein 7.9 6.5 - 8.1 g/dL   Albumin 4.2 3.5 - 5.0 g/dL   AST 37 15 - 41 U/L   ALT 22 0 - 44 U/L   Alkaline Phosphatase  51 38 - 126 U/L   Total Bilirubin 0.7 0.3 - 1.2 mg/dL   GFR, Estimated >37 >10 mL/min    Comment: (NOTE) Calculated using the CKD-EPI Creatinine Equation (2021)    Anion gap 15 5 - 15    Comment: Performed at [facility], [address] [place], [place] [address]  Acetaminophen level     Status: Abnormal   Collection Time: 05/06/20 11:15 PM  Result Value Ref Range   Acetaminophen (Tylenol), Serum <10 (L) 10 - 30 ug/mL    Comment: (NOTE) Therapeutic concentrations vary significantly. A range of 10-30 ug/mL  may be an effective concentration for many patients. However, some  are best treated at concentrations outside of this range. Acetaminophen concentrations >150 ug/mL at 4 hours after ingestion  and >50 ug/mL at 12 hours after ingestion are often associated with  toxic reactions.  Performed at [facility], [address] [place], [place] [address]   Salicylate level     Status: Abnormal   Collection Time: 05/06/20 11:15 PM  Result Value Ref Range   Salicylate Lvl <7.0 (L) 7.0 - 30.0 mg/dL    Comment: Performed at [facility], [address], [place], [place] [address]  Ethanol     Status: None   Collection Time: 05/06/20 11:15 PM  Result Value Ref Range   Alcohol, Ethyl (B) <10 <10 mg/dL    Comment: (NOTE) Lowest detectable limit for serum alcohol is 10 mg/dL.  For medical purposes only. Performed at [facility], [address] [place], [place] [address]   Respiratory Panel by RT PCR (Flu A&B, Covid) -  Nasopharyngeal Swab     Status: None   Collection Time: 05/07/20 12:05 AM   Specimen: Nasopharyngeal Swab  Result Value Ref Range   SARS Coronavirus 2 by RT PCR NEGATIVE NEGATIVE    Comment: (NOTE) SARS-CoV-2 target nucleic acids are NOT DETECTED.  The SARS-CoV-2 RNA is generally detectable in upper respiratoy specimens during the acute phase of infection. The lowest concentration of SARS-CoV-2 viral copies this assay can detect is 131 copies/mL. A negative result does not preclude SARS-Cov-2 infection and should not be used as the sole basis for treatment or other patient management decisions. A negative result may occur with  improper specimen collection/handling, submission of specimen other than nasopharyngeal swab, presence of viral mutation(s) within the areas targeted by this assay, and inadequate number of viral copies (<131 copies/mL). A negative result must be combined with clinical observations, patient history, and epidemiological information. The expected result is Negative.  Fact Sheet for Patients:  https://www.moore.com/  Fact Sheet for Healthcare Providers:  https://www.young.biz/  This test is no t yet approved or cleared by the [place] FDA and  has been authorized for detection and/or diagnosis of SARS-CoV-2 by FDA under an Emergency Use Authorization (EUA). This EUA will remain  in effect (meaning this test can be used) for the duration of the COVID-19 declaration under Section 564(b)(1) of the Act, 21 U.S.C. section 360bbb-3(b)(1), unless the authorization is terminated or revoked sooner.     Influenza A by PCR NEGATIVE NEGATIVE   Influenza B by PCR NEGATIVE NEGATIVE    Comment: (NOTE) The Xpert Xpress SARS-CoV-2/FLU/RSV assay is intended as an aid in  the diagnosis of influenza from Nasopharyngeal swab specimens and  should not be used as a sole basis for treatment. Nasal washings and  aspirates are  unacceptable for Xpert Xpress SARS-CoV-2/FLU/RSV  testing.  Fact Sheet for Patients: https://www.moore.com/  Fact Sheet for Healthcare Providers: https://www.young.biz/  This test is not yet approved or cleared by the [place] FDA and  has been authorized for detection and/or diagnosis of SARS-CoV-2 by  FDA under an Emergency Use Authorization (EUA). This EUA will remain  in effect (meaning this test can be used) for the duration of the  Covid-19 declaration under Section 564(b)(1) of the Act, 21  U.S.C. section 360bbb-3(b)(1), unless the authorization is  terminated or revoked. Performed at [facility], [address] [place], [place] [address]     Blood Alcohol level:  Lab Results  Component Value Date   ETH <10 05/06/2020    Metabolic Disorder Labs:  No results found for: HGBA1C, MPG No results found for: PROLACTIN No results found for: CHOL, TRIG, HDL, CHOLHDL, VLDL, LDLCALC  Current Medications: Current Facility-Administered Medications  Medication Dose Route Frequency Provider Last Rate Last Admin  . acetaminophen (TYLENOL) tablet 650 mg  650 mg Oral Q6H PRN Lauro Franklin, MD      . alum & mag hydroxide-simeth (MAALOX/MYLANTA) 200-200-20 MG/5ML suspension 30 mL  30 mL Oral Q4H PRN Lauro Franklin, MD      . diphenhydrAMINE (BENADRYL) capsule 50 mg  50 mg Oral Q6H PRN Mariel Craft, MD       Or  . diphenhydrAMINE (BENADRYL) injection 50 mg  50 mg Intramuscular Q6H PRN Mariel Craft, MD      . haloperidol (HALDOL) tablet 5 mg  5 mg Oral Q8H PRN Mariel Craft, MD       Or  . haloperidol lactate (HALDOL) injection 5 mg  5 mg Intramuscular Q8H PRN Mariel Craft, MD      . LORazepam (ATIVAN) tablet 2 mg  2 mg Oral Q6H PRN Mariel Craft, MD       Or  . LORazepam (ATIVAN) injection 2 mg  2 mg Intramuscular Q6H PRN Mariel Craft, MD      . OLANZapine zydis [facility])  disintegrating tablet 10 mg  10 mg Oral Q8H PRN Mariel Craft, MD       And  . LORazepam (ATIVAN) tablet 1 mg  1 mg Oral PRN Mariel Craft, MD       And  . ziprasidone (GEODON) injection 20 mg  20 mg Intramuscular PRN Mariel Craft, MD      . magnesium hydroxide (MILK OF MAGNESIA) suspension 30 mL  30 mL Oral Daily PRN Renaldo Fiddler, Mardelle Matte, MD      . nicotine (NICODERM CQ - dosed in mg/24 hours) patch 21 mg  21 mg Transdermal Q0600 Lauro Franklin, MD      . potassium chloride SA (KLOR-CON) CR tablet 40 mEq  40 mEq Oral BID Lauro Franklin, MD       PTA Medications: Medications Prior to Admission  Medication Sig Dispense Refill Last Dose  . acetaminophen (TYLENOL) 500 MG tablet Take 1,000 mg by mouth every 4 (four) hours as needed for moderate pain. (Patient not taking: Reported on 05/06/2020)     . acetaminophen-codeine (TYLENOL #3) 300-30 MG per tablet Take 1 tablet by mouth every 6 (six) hours as needed for moderate pain. (Patient not taking: Reported on 05/22/2015) 15 tablet 0   . amoxicillin (AMOXIL) 500 MG capsule Take 1 capsule (500 mg total) by mouth 3 (three) times daily. (Patient not taking: Reported on 05/22/2015) 21 capsule 0   . amoxicillin-clavulanate (AUGMENTIN) 875-125  MG tablet Take 1 tablet by mouth every 12 (twelve) hours. (Patient not taking: Reported on 05/06/2020) 14 tablet 0   . cyclobenzaprine (FLEXERIL) 5 MG tablet Take 2 tablets (10 mg total) by mouth 2 (two) times daily as needed for muscle spasms. (Patient not taking: Reported on 05/06/2020) 20 tablet 0   . HYDROcodone-acetaminophen (NORCO/VICODIN) 5-325 MG tablet Take 1-2 tablets by mouth every 6 (six) hours as needed. (Patient not taking: Reported on 05/06/2020) 4 tablet 0   . traMADol (ULTRAM) 50 MG tablet Take 1 tablet (50 mg total) by mouth every 6 (six) hours as needed. (Patient not taking: Reported on 05/06/2020) 10 tablet 0     Musculoskeletal: Strength & Muscle Tone: within normal  limits Gait & Station: normal Patient leans: N/A  Psychiatric Specialty Exam: Physical Exam Vitals and nursing note reviewed.  Constitutional:      General: He is not in acute distress.    Appearance: Normal appearance. He is normal weight. He is not ill-appearing, toxic-appearing or diaphoretic.  HENT:     Head: Normocephalic and atraumatic.  Cardiovascular:     Rate and Rhythm: Normal rate.  Pulmonary:     Effort: Pulmonary effort is normal.  Abdominal:     Tenderness: There is no abdominal tenderness.  Musculoskeletal:        General: Normal range of motion.  Neurological:     General: No focal deficit present.     Review of Systems  Constitutional: Negative for fever.  Eyes: Positive for visual disturbance (blurry vision comes and goes).  Respiratory: Negative for chest tightness and shortness of breath.   Cardiovascular: Negative for chest pain and palpitations.  Gastrointestinal: Negative for abdominal pain, constipation, diarrhea, nausea and vomiting.  Neurological: Negative for dizziness, weakness, light-headedness and headaches.  Psychiatric/Behavioral: Positive for decreased concentration. Negative for agitation, hallucinations, self-injury, sleep disturbance and suicidal ideas.    Blood pressure (!) 132/102, pulse (!) 106, temperature 98.6 F (37 C), temperature source Oral.There is no height or weight on file to calculate BMI.  General Appearance: Disheveled  Eye Contact:  Minimal  Speech:  Garbled and Slurred  Volume:  Decreased  Mood:  Depressed and Sleepy  Affect:  Depressed  Thought Process:  Linear  Orientation:  Full (Time, Place, and Person)  Thought Content:  Logical  Suicidal Thoughts:  No  Homicidal Thoughts:  No  Memory:  Immediate;   Fair Recent;   Fair  Judgement:  Fair  Insight:  Shallow  Psychomotor Activity:  Decreased  Concentration:  Concentration: Poor  Recall:  Poor  Fund of Knowledge:  Poor  Language:  Good  Akathisia:  No   Handed:  Right  AIMS (if indicated):     Assets:  Housing Resilience  ADL's:  Intact  Cognition:  WNL  Sleep:       Treatment Plan Summary: Daily contact with patient to assess and evaluate symptoms and progress in treatment  Patient currently pleasant and cooperative. Denies SI,HI, AVH and paranoid thoughts. Will not start any medications at this time as most likely cause is drug induced psychosis. Will continue to monitor for signs of agitation, aggression, and paranoia.  Have ordered PRN Zyprexa, Geodon, Ativan, and Haldol for Agitation.   Will draw A1C, Lipid Panel, and TSH tomorrow.   On admission K is 2.9 will order repletion -Kdur 40 mEq 2 doses -Will recheck CMP tomorrow morning   Prolonged QTc EKG today shows QTc of 466 will order repeat EKG tomorrow to monitor.  Observation Level/Precautions:  15 minute checks  Laboratory:  UDS: Positive for Cocaine and Amphetamines   Psychotherapy:    Medications:    Consultations:    Discharge Concerns:    Estimated LOS:  Other:     Physician Treatment Plan for Primary Diagnosis: <principal problem not specified> Long Term Goal(s): Improvement in symptoms so as ready for discharge  Short Term Goals: Ability to identify changes in lifestyle to reduce recurrence of condition will improve, Ability to verbalize feelings will improve, Ability to disclose and discuss suicidal ideas, Ability to demonstrate self-control will improve, Ability to identify and develop effective coping behaviors will improve and Ability to identify triggers associated with substance abuse/mental health issues will improve  Physician Treatment Plan for Secondary Diagnosis: Active Problems:   Psychoactive substance-induced psychosis (HCC)  Long Term Goal(s): Improvement in symptoms so as ready for discharge  Short Term Goals: Ability to identify changes in lifestyle to reduce recurrence of condition will improve, Ability to verbalize feelings will  improve, Ability to disclose and discuss suicidal ideas, Ability to demonstrate self-control will improve, Ability to identify and develop effective coping behaviors will improve and Ability to identify triggers associated with substance abuse/mental health issues will improve  I certify that inpatient services furnished can reasonably be expected to improve the patient's condition.    Lauro Franklin, MD 11/13/202112:18 PM

## 2020-05-07 NOTE — BHH Counselor (Signed)
Adult Comprehensive Assessment  Patient ID: Anthony Wiggins, male   DOB: [birth year], 34 y.o.   MRN: [id number]  Information Source: Information source: Patient  Current Stressors:  Patient states their primary concerns and needs for treatment are:: Things going on with bad news that he had no control over. Patient states their goals for this hospitilization and ongoing recovery are:: Calm down, remain calm Educational / Learning stressors: Denies stressors Employment / Job issues: Denies stressors Family Relationships: Has 3 children, denies stressors. Financial / Lack of resources (include bankruptcy): Denies stressors Housing / Lack of housing: Denies stressors Physical health (include injuries & life threatening diseases): Denies stressors Social relationships: Sometimes with girlfriend things can be strained, but not usually. Substance abuse: Denies stressors Bereavement / Loss: Denies stressors  Living/Environment/Situation:  Living Arrangements: Children, Spouse/significant other Living conditions (as described by patient or guardian): Good Who else lives in the home?: Girlfriend, 3 kids How long has patient lived in current situation?: "A long time" What is atmosphere in current home: Comfortable, Paramedic, Supportive  Family History:  Marital status: Long term relationship Long term relationship, how long?: 15 years What types of issues is patient dealing with in the relationship?: Denies having any issues Additional relationship information: n/a Are you sexually active?: Yes What is your sexual orientation?: Heterosexual Has your sexual activity been affected by drugs, alcohol, medication, or emotional stress?: Denies Does patient have children?: Yes How many children?: 3 How is patient's relationship with their children?: 8yo, 10yo, 14yo - great with all of them  Childhood History:  By whom was/is the patient raised?: Mother/father and step-parent Additional childhood  history information: Patient stated he was raised by his mother and step-father. States he had a great relationship Description of patient's relationship with caregiver when they were a child: Mother and stepfather got along great with him, but not with each other so they were not together.  Patient would go between their houses.  Biological father - no relationship. Patient's description of current relationship with people who raised him/her: Mother - still great; Stepfather - still great; Biological father - no relationship How were you disciplined when you got in trouble as a child/adolescent?: Whoopings Does patient have siblings?: Yes (2 brothers) Number of Siblings: 2 Description of patient's current relationship with siblings: Has 2 brothers and states his relationships are "ok" Did patient suffer any verbal/emotional/physical/sexual abuse as a child?: No Did patient suffer from severe childhood neglect?: No Has patient ever been sexually abused/assaulted/raped as an adolescent or adult?: No Was the patient ever a victim of a crime or a disaster?: No Witnessed domestic violence?: No Has patient been affected by domestic violence as an adult?: No  Education:  Highest grade of school patient has completed: GED from [employer] Currently a Consulting civil engineer?: No Learning disability?: Yes What learning problems does patient have?: ADHD - did not like the medicine, would knock him out  Employment/Work Situation:   Employment situation: Employed 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 How long has patient been employed?: 3 years Patient's job has been impacted by current illness: No What is the longest time patient has a held a job?: 2 years Where was the patient employed at that time?: [employer] Has patient ever been in the [employer]?: No  Financial Resources:   Financial resources: Income from employment Does patient have a representative payee or  guardian?: No  Alcohol/Substance Abuse:   What has been your use of drugs/alcohol within the  last 12 months?: States he uses 10 of methamphetine every 36 hrs, 2 beers every 2-3 days, occassional THC use and social Cocaine use If attempted suicide, did drugs/alcohol play a role in this?: No Alcohol/Substance Abuse Treatment Hx: Past Tx, Inpatient If yes, describe treatment: [facility] Has alcohol/substance abuse ever caused legal problems?: Yes  Social Support System:   Patient's Community Support System: Good Describe Community Support System: mom, dad, s/o Type of faith/religion: [employer] How does patient's faith help to cope with current illness?: "It helps a lot"  Leisure/Recreation:   Do You Have Hobbies?: Yes Leisure and Hobbies: Basketball, fishing  Strengths/Needs:   What is the patient's perception of their strengths?: "Working" Patient states they can use these personal strengths during their treatment to contribute to their recovery: "able to make money" Patient states these barriers may affect/interfere with their treatment: None Patient states these barriers may affect their return to the community: None Other important information patient would like considered in planning for their treatment: None  Discharge Plan:   Currently receiving community mental health services: No Patient states concerns and preferences for aftercare planning are: Patient is not interested in follow up at this time Patient states they will know when they are safe and ready for discharge when: Yes, feels ready now Does patient have access to transportation?: Yes Does patient have financial barriers related to discharge medications?: Yes Patient description of barriers related to discharge medications: No insurance Will patient be returning to same living situation after discharge?: Yes  Summary/Recommendations:   Summary and Recommendations (to be completed by the evaluator): Anthony Wiggins is an 34 y.o. male who presents to [facility] ED via Patent examiner after being petitioned for involuntary commitment by his mother, Anthony Wiggins [phone]. Affidavit and petition states: "Respondent was diagnosed with schizophrenia but no longer takes medication. Was committed in the [facility] to [facility]. Talks to himself. Thinks someone is after him and won't go inside his room. Fears for his life. May drink alcohol."  While here, Warner Laduca can benefit from crisis stabilization, medication management, therapeutic milieu, and referrals for services.  Terecia Plaut A Dayven Linsley. 05/07/2020

## 2020-05-07 NOTE — Progress Notes (Signed)
Patient has been isolative to his room tonight. He was compliant with his medications. He did vomit once tonight reporting that he thought it was the chicken he ate. He is very soft spoken but has been pleasant. He is paranoid and suspicious when writer enters his room. Support given and safety maintained with 15 min checks.

## 2020-05-07 NOTE — BH Assessment (Signed)
Melissa, NS to fax pt's IVC paperwork. Once IVC paperwork is received clinician to engage pt in TTS assessment.    Redmond Pulling, MS, [facility], [facility] Triage Specialist [phone]

## 2020-05-07 NOTE — BH Assessment (Signed)
Tele Assessment Note   Patient Name: Anthony Wiggins MRN: [id number] Referring Physician: Antony Madura, PA-C Location of Patient: [facility] ED, [phone] Location of Provider: Behavioral Health TTS Department  Anthony Wiggins is an 34 y.o. male who presents to [facility] ED via Patent examiner after being petitioned for involuntary commitment by his mother, [name] [phone]. Affidavit and petition states: "Respondent was diagnosed with schizophrenia but no longer takes medication. Was committed in the 90's to [facility]. Talks to himself. Thinks someone is after him and won't go inside his room. Fears for his life. May drink alcohol."   Per medical record, Pt presented to [facility] with agitation and paranoia. He stated he thinks staff is trying to kill him and there are bombs. He required sedation and restraint.  During assessment Pt appears to have difficulty concentrating and presents as making an effort to answer questions appropriately. He reports he "had a blackout" earlier and that he has been "stressed out." When asked what is putting him under stress, Pt states, "work and relationships" but does not elaborate. He reports he used methamphetamines yesterday but does not believe that it was responsible for his anxiety. He denies depressive symptoms. He denies current suicidal ideation or history of suicide attempts. He denies current homicidal ideation or history of aggression. He denies experiencing auditory or visual hallucinations. Pt's urine drug screen is positive for amphetamines and cocaine. Pt is vague when describing details of use.  Pt reports he lives with his girlfriend and their three children. 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 He identifies his girlfriend, mother, father, and brothers as his primary support. He denies history of abuse or trauma. He denies legal problems. He denies access to firearms. Pt says he does not have any mental health providers and does not take psychiatric  medications. He says he was prescribed psychiatric medications in the past but they were too sedating. He says [facility]  EDP documents she spoke with Pt's mother. She states the Pt thinks someone is trying to hurt and kill him. Felt there was a bomb in the police car today. Supposed to be on Haldol and Risperdal. Got call from mother of his child stating his symptom were worsening today. Last psychosis episode was ~3 years ago, but hasn't followed with a doctor for at least 5 years. Mother unaware of any illicit substance use; knows he does drink ETOH on occasion but unsure of how much.  Pt is dressed in hospital scrubs, alert and oriented x4. Pt speaks in a clear tone, at moderate volume and has a short delay when responding to questions. Motor behavior appears normal. Eye contact is good. Pt's mood is anxious and affect is congruent with mood. Thought process is coherent. Pt's insight and judgment are impaired. Pt was polite and cooperative throughout assessment.   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 Dental caries   . Schizophrenia (HCC)     History reviewed. No pertinent surgical history.  Family History:  Family History  Problem Relation Age of Onset  . Diabetes Mother   . Hypertension Mother     Social History:  reports that he has been smoking cigarettes. He has been smoking about 0.50 packs per day. He has never used smokeless tobacco. He reports current alcohol use of about 4.0  standard drinks of alcohol per week. He reports that he does not use drugs.  Additional Social History:  Alcohol / Drug Use Pain Medications: Denies abuse Prescriptions: Denies abuse Over the Counter: Denies abuse History of alcohol / drug use?: Yes Longest period of sobriety (when/how long):  Unknown Substance #1 Name of Substance 1: Methamphetamines 1 - Age of First Use: unknown 1 - Amount (size/oz): unknown 1 - Frequency: "not often" 1 - Duration: Ongoing 1 - Last Use / Amount: 05/06/2020 Substance #2 Name of Substance 2: Cocaine 2 - Age of First Use: unknown 2 - Amount (size/oz): unknown 2 - Frequency: unknown 2 - Duration: unknown 2 - Last Use / Amount: unknown  CIWA: CIWA-Ar BP: (!) 143/88 Pulse Rate: (!) 103 COWS:    Allergies:  Allergies  Allergen Reactions  . Naproxen Rash    Home Medications: (Not in a hospital admission)   OB/GYN Status:  No LMP for male patient.  General Assessment Data Location of Assessment: [facility] ED TTS Assessment: In system Is this a Tele or Face-to-Face Assessment?: Tele Assessment Is this an Initial Assessment or a Re-assessment for this encounter?: Initial Assessment Patient Accompanied by:: N/A Language Other than English: No Living Arrangements: Other (Comment) (Lives with girlfriend and 3 children) What gender do you identify as?: Male Date Telepsych consult ordered in [facility]: 05/06/20 Time Telepsych consult ordered in [facility]: 2346 Marital status: Single Maiden name: NA Pregnancy Status: No Living Arrangements: Spouse/significant other, Children Can pt return to current living arrangement?: Yes Admission Status: Involuntary Petitioner: Family member Is patient capable of signing voluntary admission?: Yes Referral Source: Self/Family/Friend Insurance type: Self-pay     Crisis Care Plan Living Arrangements: Spouse/significant other, Children Legal Guardian: Other: (Self) Name of Psychiatrist: None Name of Therapist: None  Education Status Is patient currently in school?: No Is the patient employed, unemployed or receiving disability?: Employed  Risk to self with the past 6 months Suicidal Ideation: No Has patient been a risk to self within the past 6 months prior to admission? : No Suicidal Intent: No Has  patient had any suicidal intent within the past 6 months prior to admission? : No Is patient at risk for suicide?: No Suicidal Plan?: No Has patient had any suicidal plan within the past 6 months prior to admission? : No Access to Means: No What has been your use of drugs/alcohol within the last 12 months?: Pt using methamphetamines and cocaine Previous Attempts/Gestures: No How many times?: 0 Other Self Harm Risks: None Triggers for Past Attempts: None known Intentional Self Injurious Behavior: None Family Suicide History: Unknown Recent stressful life event(s): Other (Comment) ("work and relationships") Persecutory voices/beliefs?: Yes Depression: No Depression Symptoms:  (Pt denies depressive symptoms) Substance abuse history and/or treatment for substance abuse?: Yes Suicide prevention information given to non-admitted patients: Not applicable  Risk to Others within the past 6 months Homicidal Ideation: No Does patient have any lifetime risk of violence toward others beyond the six months prior to admission? : No Thoughts of Harm to Others: No Current Homicidal Intent: No Current Homicidal Plan: No Access to Homicidal Means: No Identified Victim: Pt denies History of harm to others?: No Assessment of Violence: None Noted Violent Behavior Description: None Does patient have access to weapons?: No Criminal Charges Pending?: No Does patient have a court date: No Is patient on probation?: No  Psychosis Hallucinations: Auditory Delusions: Persecutory  Mental Status Report Appearance/Hygiene: In scrubs Eye Contact: Good Motor Activity: Freedom of movement Speech: Other (Comment) (  Pauses when responding) Level of Consciousness: Alert Mood: Anxious Affect: Anxious Anxiety Level: Moderate Thought Processes: Coherent Judgement: Impaired Orientation: Person, Place, Time, Situation Obsessive Compulsive Thoughts/Behaviors: None  Cognitive Functioning Concentration:  Decreased Memory: Recent Intact, Remote Intact Is patient IDD: No Insight: Poor Impulse Control: Poor Appetite: Fair Have you had any weight changes? : No Change Sleep: No Change Total Hours of Sleep: 7 Vegetative Symptoms: None  ADLScreening [facility] Assessment Services) Patient's cognitive ability adequate to safely complete daily activities?: Yes Patient able to express need for assistance with ADLs?: Yes Independently performs ADLs?: Yes (appropriate for developmental age)  Prior Inpatient Therapy Prior Inpatient Therapy: Yes Prior Therapy Dates: 1990s Prior Therapy Facilty/Provider(s): [facility] Reason for Treatment: Schizophrenia  Prior Outpatient Therapy Prior Outpatient Therapy: Yes Prior Therapy Dates: unknown Prior Therapy Facilty/Provider(s): unknown Reason for Treatment: schizophrenia Does patient have an ACCT team?: No Does patient have Intensive In-House Services?  : No Does patient have [facility] services? : No Does patient have P4CC services?: No  ADL Screening (condition at time of admission) Patient's cognitive ability adequate to safely complete daily activities?: Yes Is the patient deaf or have difficulty hearing?: No Does the patient have difficulty seeing, even when wearing glasses/contacts?: No Does the patient have difficulty concentrating, remembering, or making decisions?: No Patient able to express need for assistance with ADLs?: Yes Does the patient have difficulty dressing or bathing?: No Independently performs ADLs?: Yes (appropriate for developmental age) Does the patient have difficulty walking or climbing stairs?: No Weakness of Legs: None Weakness of Arms/Hands: None  Home Assistive Devices/Equipment Home Assistive Devices/Equipment: None    Abuse/Neglect Assessment (Assessment to be complete while patient is alone) Abuse/Neglect Assessment Can Be Completed: Yes Physical Abuse: Denies Verbal Abuse: Denies Sexual Abuse:  Denies Exploitation of patient/patient's resources: Denies Self-Neglect: Denies     Merchant navy officer (For Healthcare) Does Patient Have a Medical Advance Directive?: No Would patient like information on creating a medical advance directive?: No - Patient declined          Disposition: Gave clinical report to Gillermo Murdoch, NP who said Pt meets criteria for inpatient psychiatric treatment. Tosin, AC at [facility], says Pt can be admitted to the service of Dr. Cindi Carbon, room 503-1, however day shift AC at [facility] will call when Pt can be transferred. Notified Antony Madura, PA-C and Blima Rich, RN of acceptance.  Disposition Initial Assessment Completed for this Encounter: Yes  This service was provided via telemedicine using a 2-way, interactive audio and video technology.  Names of all persons participating in this telemedicine service and their role in this encounter. Name: Bishop Dublin Role: Patient  Name: Shela Commons, [facility] Role: TTS counselor         Harlin Rain Patsy [place], [facility], [facility] Triage Specialist [phone]  Pamalee Leyden 05/07/2020 5:07 AM

## 2020-05-07 NOTE — BHH Group Notes (Addendum)
.  Psychoeducational Group Note  Date: 05-06-20 Time: 0900-1000    Goal Setting   Purpose of Group: This group helps to provide patients with the steps of setting a goal that is specific, measurable, attainable, realistic and time specific. A discussion on how we keep ourselves stuck with negative self talk.    Participation Level:  Did not attend   Anthony Wiggins A  

## 2020-05-07 NOTE — ED Notes (Signed)
Patient received breakfast tray 

## 2020-05-07 NOTE — BH Assessment (Signed)
At 0355 clinician spoke to [facility] to express she is ready to complete pt's TTS assessment. Melissa to inform pt's nurse.    Redmond Pulling, MS, [facility], [facility] Triage Specialist [phone].

## 2020-05-07 NOTE — ED Notes (Signed)
Pt talked to mother. 2 times. First time was to talk to mom and second was an attempt to call girlfriend, but she did not have a voicemail. Pt asked if they could tell their mother to relay a message to girlfriend. Was given an ok. Pt has remained calm and cooperative.

## 2020-05-07 NOTE — BH Assessment (Signed)
At 0401, clinician called TTS cart however there was no answer. Clinician to try later.    Redmond Pulling, MS, [facility], [facility] Triage Specialist [phone].

## 2020-05-07 NOTE — [facility] Suicide Risk Assessment (Signed)
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 And because of this he said some things that his mother took out of context and that's why she had him IVC'd. He reports no SI/HI, or AVH.   Per IVC petition by mother patient said that someone was after him and he was afraid to go into his room, and that there was a bomb in the police car. According to patients mother he had a similar episode about 3 yrs ago and that the last time he saw a psychiatrist was about 5 yrs ago. He was hospitalized at [facility] in the 90's.  UDS obtained in the ED was positive for Cocaine and Amphetamines. On questioning he reports that he does smoke crack cocaine about 2-3 times a week and his last use must have been cut with something. He was extremely agitated and aggressive in the ED and required medication and physical restraints to protect himself and others.   Associated  Signs/Symptoms: Depression Symptoms:  loss of energy/fatigue, Duration of Depression Symptoms: No data recorded (Hypo) Manic Symptoms:  None Anxiety Symptoms:  None Psychotic Symptoms:  On presentation to the ED: Agitation, Aggression, and Paranoia Duration of Psychotic Symptoms: No data recorded PTSD Symptoms: NA Total Time spent with patient: 20 minutes  Past Psychiatric History: Hospitalized at [facility] in the 90's   Is the patient at risk to self? No.  Has the patient been a risk to self in the past 6 months? Yes.    Has the patient been a risk to self within the distant past? No.  Is the patient a risk to others? No.  Has the patient been a risk to others in the past 6 months? Yes.    Has the patient been a risk to others within the distant past? No.   Prior Inpatient Therapy:   Prior Outpatient Therapy:    Alcohol Screening: 1. How often do you have a drink containing alcohol?: 2 to 4 times a month 2. How many drinks containing alcohol do you have on a typical day when you are drinking?: 1 or 2 3. How often do you have six or more drinks on one occasion?: Never AUDIT-C Score: 2 9. Have you or someone else been injured as a result of your drinking?: No 10. Has a relative or friend or a doctor or another health worker been concerned about your drinking or suggested you cut down?: No Alcohol Use Disorder Identification Test Final Score (AUDIT): 2 Alcohol Brief Interventions/Follow-up: AUDIT Score <7 follow-up not indicated Substance Abuse History  in the last 12 months:  Yes.   Consequences of Substance Abuse: Agitation, Aggression, and Paranoia Previous Psychotropic Medications: No  Psychological Evaluations: Yes   Continued Clinical Symptoms:  Alcohol Use Disorder Identification Test Final Score (AUDIT): 2 The "Alcohol Use Disorders Identification Test", Guidelines for Use in Primary Care, Second Edition.  World Science writer [facility]). Score between 0-7:  no or low  risk or alcohol related problems. Score between 8-15:  moderate risk of alcohol related problems. Score between 16-19:  high risk of alcohol related problems. Score 20 or above:  warrants further diagnostic evaluation for alcohol dependence and treatment.   CLINICAL FACTORS:   Severe Anxiety and/or Agitation Alcohol/Substance Abuse/Dependencies Currently Psychotic Previous Psychiatric Diagnoses and Treatments  Musculoskeletal: Strength & Muscle Tone: within normal limits Gait & Station: normal Patient leans: N/A  Psychiatric Specialty Exam: Physical Exam Vitals and nursing note reviewed.  Constitutional:      General: He is not in acute distress.    Appearance: Normal appearance. He is normal weight. He is not ill-appearing, toxic-appearing or diaphoretic.  HENT:     Head: Normocephalic and atraumatic.  Cardiovascular:     Rate and Rhythm: Normal rate.  Pulmonary:     Effort: Pulmonary effort is normal.  Abdominal:     Tenderness: There is no abdominal tenderness.  Musculoskeletal:        General: Normal range of motion.  Neurological:     General: No focal deficit present.     Review of Systems  Constitutional: Negative for fever.  Eyes: Positive for visual disturbance (blurry vision comes and goes).  Respiratory: Negative for chest tightness and shortness of breath.   Cardiovascular: Negative for chest pain and palpitations.  Gastrointestinal: Negative for abdominal pain, constipation, diarrhea, nausea and vomiting.  Neurological: Negative for dizziness, weakness, light-headedness and headaches.  Psychiatric/Behavioral: Positive for decreased concentration. Negative for agitation, hallucinations, self-injury, sleep disturbance and suicidal ideas.    Blood pressure (!) 132/102, pulse (!) 106, temperature 98.6 F (37 C), temperature source Oral.There is no height or weight on file to calculate BMI.  General Appearance: Disheveled  Eye Contact:  Minimal  Speech:   Garbled and Slurred  Volume:  Decreased  Mood:  Depressed and Sleepy  Affect:  Depressed  Thought Process:  Linear  Orientation:  Full (Time, Place, and Person)  Thought Content:  Logical  Suicidal Thoughts:  No  Homicidal Thoughts:  No  Memory:  Immediate;   Fair Recent;   Fair  Judgement:  Fair  Insight:  Shallow  Psychomotor Activity:  Decreased  Concentration:  Concentration: Poor  Recall:  Poor  Fund of Knowledge:  Poor  Language:  Good  Akathisia:  No  Handed:  Right  AIMS (if indicated):     Assets:  Housing Resilience  ADL's:  Intact  Cognition:  WNL  Sleep:   poor    COGNITIVE FEATURES THAT CONTRIBUTE TO RISK:  Thought constriction (tunnel vision)    SUICIDE RISK:   Minimal: No identifiable suicidal ideation.  Patients presenting with no risk factors but with morbid ruminations; may be classified as minimal risk based on the severity of the depressive symptoms  PLAN OF CARE:   Treatment Plan Summary: Daily contact with patient to assess and evaluate symptoms and progress in treatment  Patient currently pleasant and cooperative. Denies SI,HI, AVH and paranoid thoughts. Will not start any medications at this time as most likely cause is drug induced psychosis. Will continue to monitor for signs of  agitation, aggression, and paranoia.  Have ordered PRN Zyprexa, Geodon, Ativan, and Haldol for Agitation.   Will draw A1C, Lipid Panel, and TSH tomorrow.   On admission K is 2.9 will order repletion -Kdur 40 mEq 2 doses -Will recheck CMP tomorrow morning   Prolonged QTc EKG today shows QTc of 466 will order repeat EKG tomorrow to monitor.   Observation Level/Precautions:  15 minute checks  Laboratory:  UDS: Positive for Cocaine and Amphetamines   Psychotherapy:   Attend groups on the milieu and the present in milieu  Medications:   Start Zyprexa 10 mg with evening meal to prevent psychosis; agitation protocol is in place; start potassium 40 mg once  twice daily for potassium repletion; start metoprolol XL 25 mg daily for elevated blood pressure and tachycardia.  Consultations:   None  Discharge Concerns:   Compliance with medication  Estimated LOS: 2-5 days  Other:   Ensure potassium repleted, blood pressure controlled prior to discharge.  Encourage substance use cessation.   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)  Long Term Goal(s): Improvement in symptoms so as ready for discharge  Short Term Goals: Ability to identify changes in lifestyle to reduce recurrence of condition will improve, Ability to verbalize feelings will improve, Ability to disclose and discuss suicidal ideas, Ability to demonstrate self-control will improve, Ability to identify and develop effective coping behaviors will improve and Ability to identify triggers associated with substance abuse/mental health issues will improve  Physician Treatment Plan for Secondary Diagnosis: Active Problems: Patient Active Problem List   Diagnosis Date Noted  . 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 Elevated blood pressure reading 05/07/2020  . Tachycardia 05/07/2020  . Tobacco use disorder, moderate, dependence 05/07/2020  . 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 Methamphetamine abuse (HCC) 05/07/2020     Long Term Goal(s): Improvement in symptoms so as ready for discharge  Short Term Goals: Ability to identify changes in lifestyle to reduce recurrence of condition will improve, Ability to verbalize feelings will improve, Ability to disclose and discuss suicidal ideas, Ability to demonstrate self-control will improve, Ability to identify and develop effective coping behaviors will improve and Ability to identify triggers associated with substance abuse/mental health issues will improve  I certify that inpatient  services furnished can reasonably be expected to improve the patient's condition.       I certify that inpatient services furnished can reasonably be expected to improve the patient's condition.   Mariel Craft, MD 05/07/2020, 3:39 PM

## 2020-05-07 NOTE — ED Notes (Signed)
Attempted to call report again, Rodell Perna said she will call back around 0900.

## 2020-05-08 DIAGNOSIS — F14251 Cocaine dependence with cocaine-induced psychotic disorder with hallucinations: Secondary | ICD-10-CM

## 2020-05-08 DIAGNOSIS — F19959 Other psychoactive substance use, unspecified with psychoactive substance-induced psychotic disorder, unspecified: Secondary | ICD-10-CM | POA: Diagnosis not present

## 2020-05-08 LAB — COMPREHENSIVE METABOLIC PANEL
ALT: 43 U/L (ref 0–44)
AST: 87 U/L — ABNORMAL HIGH (ref 15–41)
Albumin: 4.3 g/dL (ref 3.5–5.0)
Alkaline Phosphatase: 55 U/L (ref 38–126)
Anion gap: 15 (ref 5–15)
BUN: 14 mg/dL (ref 6–20)
CO2: 22 mmol/L (ref 22–32)
Calcium: 8.9 mg/dL (ref 8.9–10.3)
Chloride: 101 mmol/L (ref 98–111)
Creatinine, Ser: 1.37 mg/dL — ABNORMAL HIGH (ref 0.61–1.24)
GFR, Estimated: >60 mL/min (ref >60)
Glucose, Bld: 114 mg/dL — ABNORMAL HIGH (ref 70–99)
Potassium: 3.9 mmol/L (ref 3.5–5.1)
Sodium: 138 mmol/L (ref 135–145)
Total Bilirubin: 1.2 mg/dL (ref 0.3–1.2)
Total Protein: 7.8 g/dL (ref 6.5–8.1)

## 2020-05-08 LAB — LIPID PANEL
Cholesterol: 193 mg/dL (ref 0–200)
HDL: 53 mg/dL (ref >40)
LDL Cholesterol: 126 mg/dL — ABNORMAL HIGH (ref 0–99)
Total CHOL/HDL Ratio: 3.6 RATIO
Triglycerides: 72 mg/dL (ref <150)
VLDL: 14 mg/dL (ref 0–40)

## 2020-05-08 LAB — TSH: TSH: 0.531 u[IU]/mL (ref 0.350–4.500)

## 2020-05-08 LAB — HEMOGLOBIN A1C
Hgb A1c MFr Bld: 6.1 % — ABNORMAL HIGH (ref 4.8–5.6)
Mean Plasma Glucose: 128.37 mg/dL

## 2020-05-08 MED ORDER — DM-GUAIFENESIN ER 30-600 MG PO TB12
1.0000 | ORAL_TABLET | Freq: Two times a day (BID) | ORAL | Status: DC
  Administered 2020-05-08 (×2): 1 via ORAL
  Filled 2020-05-08 (×7): qty 1

## 2020-05-08 MED ORDER — ONDANSETRON 4 MG PO TBDP: 8.0000 mg | ORAL_TABLET | Freq: Three times a day (TID) | ORAL | Status: DC | PRN

## 2020-05-08 NOTE — Progress Notes (Signed)
Adult Psychoeducational Group Note  Date:  05/08/2020 Time:  8:41 PM  Group Topic/Focus:  Wrap-Up Group:   The focus of this group is to help patients review their daily goal of treatment and discuss progress on daily workbooks.  Participation Level:  Minimal  Participation Quality:  Appropriate  Affect:  Appropriate  Cognitive:  Confused  Insight: Limited  Engagement in Group:  Engaged  Modes of Intervention:  Education and Support  Additional Comments:  Patient attended and participated in group tonight. He report having a great day. He attended group and went for his meals. He spoke with hs doctor and socialized in the dayroom with peers.  Lita [facility] 05/08/2020, 8:41 PM

## 2020-05-08 NOTE — Progress Notes (Signed)
   05/08/20 2040  COVID-19 Daily Checkoff  Have you had a fever (temp > 37.80C/100F)  in the past 24 hours?  No  If you have had runny nose, nasal congestion, sneezing in the past 24 hours, has it worsened? No  COVID-19 EXPOSURE  Have you traveled outside the state in the past 14 days? No  Have you been in contact with someone with a confirmed diagnosis of COVID-19 or PUI in the past 14 days without wearing appropriate PPE? No  Have you been living in the same home as a person with confirmed diagnosis of COVID-19 or a PUI (household contact)? No  Have you been diagnosed with COVID-19? No    05/08/20 2040  COVID-19 Daily Checkoff  Have you had a fever (temp > 37.80C/100F)  in the past 24 hours?  No  If you have had runny nose, nasal congestion, sneezing in the past 24 hours, has it worsened? No  COVID-19 EXPOSURE  Have you traveled outside the state in the past 14 days? No  Have you been in contact with someone with a confirmed diagnosis of COVID-19 or PUI in the past 14 days without wearing appropriate PPE? No  Have you been living in the same home as a person with confirmed diagnosis of COVID-19 or a PUI (household contact)? No  Have you been diagnosed with COVID-19? No

## 2020-05-08 NOTE — [facility] Group Notes (Signed)
[facility] LCSW Group Therapy Note  Date/Time:  05/08/2020  11:00AM-12:00PM  Type of Therapy and Topic:  Group Therapy:  Music and Mood  Participation Level:  Minimal   Description of Group: In this process group, members listened to a variety of genres of music and identified that different types of music evoke different responses.  Patients were encouraged to identify music that was soothing for them and music that was energizing for them.  Patients discussed how this knowledge can help with wellness and recovery in various ways including managing depression and anxiety as well as encouraging healthy sleep habits.    Therapeutic Goals: Patients will explore the impact of different varieties of music on mood Patients will verbalize the thoughts they have when listening to different types of music Patients will identify music that is soothing to them as well as music that is energizing to them Patients will discuss how to use this knowledge to assist in maintaining wellness and recovery Patients will explore the use of music as a coping skill  Summary of Patient Progress:  At the beginning of group, patient expressed that he felt "alright" and a little tired.  During group he sat with his eyes closed and responded little to questions about how he reacted to songs, although at times he did say a song was uplifting or hopeful.  He started talking out loud randomly at times, but could not be understood.  It was not possible to ascertain whether he was responded to internal stimuli.  At the end of group, patient expressed that he felt less tired.  He made other comments that were inaudible, was asked to repeat himself and once again was inaudible.    Therapeutic Modalities: Solution Focused Brief Therapy Activity   Ambrose Mantle, LCSW

## 2020-05-08 NOTE — Progress Notes (Signed)
Patient has been out of his room a little more. He has been observed following his roommate around the unit. If his room ate comes to the dayroom he will follow him to the dayroom. If his roommate returns to the room he will follow him back to the room. Patient has a bizarre behavior and appears paranoid but is pleasant. Safety maintained on unit with 15 min checks.

## 2020-05-08 NOTE — Progress Notes (Signed)
[facility] MD Progress Note  05/08/2020 1:42 PM Anthony Wiggins  MRN:  [id number] Subjective:  Patient reports that his mood is good today. He reports that his sleep and appetite are at baseline. He reports that he is aware that he is at [facility] due to substance induced psychosis. He agrees with the diagnosis and believes that someone cut his substance because he has used many times before and never had this reaction. He has given it some thought and thinks that in order to avoid the same thing from happening he thinks he will just quit illicit substances in general. He would like to go to a methadone clinic. Patient reports that he has a friend on his street that goes to the clinic and they also work together. He thinks it would be best if he and friend could go together. He also thinks that having to go to a clinic everyday will be better to keep himself accountable. Patient reported that he did not want to try suboxone when asked.  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 No pertinent surgical history. Family History:  Family History  Problem Relation Age of Onset   Diabetes Mother    Hypertension Mother    Family Psychiatric  History: See H&P Social History:  Social History   Substance and Sexual Activity  Alcohol Use Yes   Alcohol/week: 4.0 standard drinks   Types: 4 Cans of beer per week     Social History   Substance and Sexual  Activity  Drug Use No    Social History   Socioeconomic History   Marital status: Single    Spouse name: Not on file   Number of children: Not on file   Years of education: Not on file   Highest education level: Not on file  Occupational History   Not on file  Tobacco Use   Smoking status: Current Every Day Smoker    Packs/day: 0.50    Types: Cigarettes   Smokeless tobacco: Never Used  Substance and Sexual Activity   Alcohol use: Yes    Alcohol/week: 4.0 standard drinks    Types: 4 Cans of beer per week   Drug use: No   Sexual activity: Not on file  Other Topics Concern   Not on file  Social History Narrative   Not on file   Social Determinants of Health   Financial Resource Strain:    Difficulty of Paying Living Expenses: Not on file  Food Insecurity:    Worried About Running Out of Food in the Last Year: Not on file   Ran Out of Food in the Last Year: Not on file  Transportation Needs:    Lack of Transportation (Medical): Not on file   Lack of Transportation (Non-Medical): Not on file  Physical Activity:    Days of Exercise per Week: Not on file   Minutes of Exercise per Session: Not on file  Stress:    Feeling of Stress : Not on  file  Social Connections:    Frequency of Communication with Friends and Family: Not on file   Frequency of Social Gatherings with Friends and Family: Not on file   Attends Religious Services: Not on Scientist, clinical (histocompatibility and immunogenetics) or Organizations: Not on file   Attends Banker Meetings: Not on file   Marital Status: Not on file   Additional Social History:                         Sleep: Fair  Appetite:  Good  Current Medications: Current Facility-Administered Medications  Medication Dose Route Frequency Provider Last Rate Last Admin   acetaminophen (TYLENOL) tablet 650 mg  650 mg Oral Q6H PRN Lauro Franklin, MD   650 mg at 05/07/20 2018   alum & mag hydroxide-simeth  (MAALOX/MYLANTA) 200-200-20 MG/5ML suspension 30 mL  30 mL Oral Q4H PRN Lauro Franklin, MD       dextromethorphan-guaiFENesin (MUCINEX DM) 30-600 MG per 12 hr tablet 1 tablet  1 tablet Oral BID Mariel Craft, MD   1 tablet at 05/08/20 0950   diphenhydrAMINE (BENADRYL) capsule 50 mg  50 mg Oral Q6H PRN Mariel Craft, MD       Or   diphenhydrAMINE (BENADRYL) injection 50 mg  50 mg Intramuscular Q6H PRN Mariel Craft, MD       haloperidol (HALDOL) tablet 5 mg  5 mg Oral Q8H PRN Mariel Craft, MD       Or   haloperidol lactate (HALDOL) injection 5 mg  5 mg Intramuscular Q8H PRN Mariel Craft, MD       LORazepam (ATIVAN) tablet 2 mg  2 mg Oral Q6H PRN Mariel Craft, MD       Or   LORazepam (ATIVAN) injection 2 mg  2 mg Intramuscular Q6H PRN Mariel Craft, MD       OLANZapine zydis (ZYPREXA) disintegrating tablet 10 mg  10 mg Oral Q8H PRN Mariel Craft, MD       And   LORazepam (ATIVAN) tablet 1 mg  1 mg Oral PRN Mariel Craft, MD       And   ziprasidone (GEODON) injection 20 mg  20 mg Intramuscular PRN Mariel Craft, MD       magnesium hydroxide (MILK OF MAGNESIA) suspension 30 mL  30 mL Oral Daily PRN Lauro Franklin, MD       metoprolol succinate (TOPROL-XL) 24 hr tablet 25 mg  25 mg Oral Daily Mariel Craft, MD   25 mg at 05/08/20 0809   nicotine (NICODERM CQ - dosed in mg/24 hours) patch 21 mg  21 mg Transdermal Q0600 Lauro Franklin, MD   21 mg at 05/08/20 0810   OLANZapine (ZYPREXA) tablet 10 mg  10 mg Oral Q supper Mariel Craft, MD   10 mg at 05/07/20 1609   ondansetron (ZOFRAN-ODT) disintegrating tablet 8 mg  8 mg Oral Q8H PRN Mariel Craft, MD        Lab Results:  Results for orders placed or performed during the hospital encounter of 05/07/20 (from the past 48 hour(s))  Hemoglobin A1c     Status: Abnormal   Collection Time: 05/08/20  7:10 AM  Result Value Ref Range   Hgb A1c MFr Bld 6.1 (H) 4.8 - 5.6 %     Comment: (NOTE) Pre diabetes:  5.7%-6.4%  Diabetes:              >6.4%  Glycemic control for   <7.0% adults with diabetes    Mean Plasma Glucose 128.37 mg/dL    Comment: Performed at [facility], [address], [place], [place] [address]  Lipid panel     Status: Abnormal   Collection Time: 05/08/20  7:10 AM  Result Value Ref Range   Cholesterol 193 0 - 200 mg/dL   Triglycerides 72 <865 mg/dL   HDL 53 >78 mg/dL   Total CHOL/HDL Ratio 3.6 RATIO   VLDL 14 0 - 40 mg/dL   LDL Cholesterol 469 (H) 0 - 99 mg/dL    Comment:        Total Cholesterol/HDL:CHD Risk Coronary Heart Disease Risk Table                     Men   Women  1/2 Average Risk   3.4   3.3  Average Risk       5.0   4.4  2 X Average Risk   9.6   7.1  3 X Average Risk  23.4   11.0        Use the calculated Patient Ratio above and the CHD Risk Table to determine the patient's CHD Risk.        ATP III CLASSIFICATION (LDL):  <100     mg/dL   Optimal  629-528  mg/dL   Near or Above                    Optimal  130-159  mg/dL   Borderline  413-244  mg/dL   High  >010     mg/dL   Very High Performed at [facility], [address] [place], [place] [address]   Comprehensive metabolic panel     Status: Abnormal   Collection Time: 05/08/20  7:10 AM  Result Value Ref Range   Sodium 138 135 - 145 mmol/L   Potassium 3.9 3.5 - 5.1 mmol/L   Chloride 101 98 - 111 mmol/L   CO2 22 22 - 32 mmol/L   Glucose, Bld 114 (H) 70 - 99 mg/dL    Comment: Glucose reference range applies only to samples taken after fasting for at least 8 hours.   BUN 14 6 - 20 mg/dL   Creatinine, Ser 6.64 (H) 0.61 - 1.24 mg/dL   Calcium 8.9 8.9 - 40.3 mg/dL   Total Protein 7.8 6.5 - 8.1 g/dL   Albumin 4.3 3.5 - 5.0 g/dL   AST 87 (H) 15 - 41 U/L   ALT 43 0 - 44 U/L   Alkaline Phosphatase 55 38 - 126 U/L   Total Bilirubin 1.2 0.3 - 1.2 mg/dL   GFR, Estimated >47 >42 mL/min    Comment: (NOTE) Calculated using the  CKD-EPI Creatinine Equation (2021)    Anion gap 15 5 - 15    Comment: Performed at [facility], [address] [place], [place] [address]  TSH     Status: None   Collection Time: 05/08/20  7:10 AM  Result Value Ref Range   TSH 0.531 0.350 - 4.500 uIU/mL    Comment: Performed by a 3rd Generation assay with a functional sensitivity of <=0.01 uIU/mL. Performed at [facility], [address] [place], [place] [address]     Blood Alcohol level:  Lab Results  Component Value Date  ETH <10 05/06/2020    Metabolic Disorder Labs: Lab Results  Component Value Date   HGBA1C 6.1 (H) 05/08/2020   MPG 128.37 05/08/2020   No results found for: PROLACTIN Lab Results  Component Value Date   CHOL 193 05/08/2020   TRIG 72 05/08/2020   HDL 53 05/08/2020   CHOLHDL 3.6 05/08/2020   VLDL 14 05/08/2020   LDLCALC 126 (H) 05/08/2020    Physical Findings: AIMS: Facial and Oral Movements Muscles of Facial Expression: None, normal Lips and Perioral Area: None, normal Jaw: None, normal Tongue: None, normal,Extremity Movements Upper (arms, wrists, hands, fingers): None, normal Lower (legs, knees, ankles, toes): None, normal, Trunk Movements Neck, shoulders, hips: None, normal, Overall Severity Severity of abnormal movements (highest score from questions above): None, normal Incapacitation due to abnormal movements: None, normal Patient's awareness of abnormal movements (rate only patient's report): No Awareness, Dental Status Current problems with teeth and/or dentures?: Yes Does patient usually wear dentures?: No  CIWA:    COWS:     Musculoskeletal: Strength & Muscle Tone: within normal limits Gait & Station: normal Patient leans: N/A  Psychiatric Specialty Exam: Physical Exam HENT:     Head: Normocephalic and atraumatic.  Eyes:     Comments: Erythematous sclera bilaterally  Pulmonary:     Effort: Pulmonary effort is normal.  Neurological:      Mental Status: He is alert.     Review of Systems  Constitutional: Negative for appetite change.  Respiratory: Negative for shortness of breath.   Psychiatric/Behavioral: Negative for hallucinations.    Blood pressure (!) 129/97, pulse (!) 121, temperature 98.1 F (36.7 C), temperature source Oral, resp. rate 18, height 5\' 10"  (1.778 m), weight 74.8 kg.Body mass index is 23.68 kg/m.  General Appearance: Disheveled  Eye Contact:  Fair  Speech:  Clear and Coherent, verbose but may be his personality  Volume:  Normal  Mood:  Euthymic  Affect:  Appropriate  Thought Process:  Goal Directed  Orientation:  NA  Thought Content:  Logical  Suicidal Thoughts:  No  Homicidal Thoughts:  No  Memory:  Immediate;   Good Recent;   Good  Judgement:  Good  Insight:  Good  Psychomotor Activity:  Normal  Concentration:  Concentration: Good and Attention Span: Fair  Recall:  NA  Fund of Knowledge:  Good  Language:  Good  Akathisia:  No  Handed:  Right  AIMS (if indicated):     Assets:  Communication Skills Desire for Improvement Housing Intimacy Social Support  ADL's:  Intact  Cognition:  WNL  Sleep:  Number of Hours: 1.5     Treatment Plan Summary: Daily contact with patient to assess and evaluate symptoms and progress in treatment  Patient doing well today he is not endorsing SI, HI, nor AVH. TSH WNL. Patient has slightly elevated Cr appears to be close to baseline. Pt LDL slighly elevated at 126. Pt A1c is 6.1, may consider starting metformin.  Patient was started on 10mg  Zyprexa.  - Continue Zyprexa and reassess at OP f/u - Consult to SW for methadone clinic  Bobbye MortonJai B Khaniya Tenaglia, MD 05/08/2020, 1:42 PM

## 2020-05-08 NOTE — Progress Notes (Signed)
   05/08/20 2040  COVID-19 Daily Checkoff  Have you had a fever (temp > 37.80C/100F)  in the past 24 hours?  No  If you have had runny nose, nasal congestion, sneezing in the past 24 hours, has it worsened? No  COVID-19 EXPOSURE  Have you traveled outside the state in the past 14 days? No  Have you been in contact with someone with a confirmed diagnosis of COVID-19 or PUI in the past 14 days without wearing appropriate PPE? No  Have you been living in the same home as a person with confirmed diagnosis of COVID-19 or a PUI (household contact)? No  Have you been diagnosed with COVID-19? No

## 2020-05-08 NOTE — Progress Notes (Signed)
   05/08/20 0623  Vital Signs  Temp 98.1 F (36.7 C)  Temp Source Oral  Pulse Rate 99  BP (!) 144/104  BP Location Right Arm  BP Method Automatic  Patient Position (if appropriate) Sitting   D: Patient denies SI/HI/AVH. Pt. Went to change rooms, went into the room and asked tech to go with him. Patient out in open areas and social with peers. Pt. Appears ner A:  Patient took scheduled medicine.  Support and encouragement provided Routine safety checks conducted every 15 minutes. Patient  Informed to notify staff with any concerns.   R: Safety maintained.vous to change rooms.

## 2020-05-09 MED ORDER — AMLODIPINE BESYLATE 10 MG PO TABS: 10.0000 mg | ORAL_TABLET | Freq: Every day | ORAL | 0 refills | Status: DC

## 2020-05-09 MED ORDER — AMLODIPINE BESYLATE 10 MG PO TABS
10.0000 mg | ORAL_TABLET | Freq: Every day | ORAL | Status: DC
  Administered 2020-05-09: 10 mg via ORAL
  Filled 2020-05-09 (×2): qty 1
  Filled 2020-05-09 (×2): qty 7

## 2020-05-09 MED ORDER — FOLIC ACID 1 MG PO TABS
1.0000 mg | ORAL_TABLET | Freq: Every day | ORAL | Status: DC
  Administered 2020-05-09: 1 mg via ORAL
  Filled 2020-05-09 (×4): qty 1

## 2020-05-09 MED ORDER — OLANZAPINE 15 MG PO TABS
15.0000 mg | ORAL_TABLET | Freq: Every day | ORAL | 0 refills | Status: DC
Start: 2020-05-09 — End: 2022-06-13

## 2020-05-09 MED ORDER — THIAMINE HCL 100 MG PO TABS
100.0000 mg | ORAL_TABLET | Freq: Every day | ORAL | Status: DC
  Administered 2020-05-09: 100 mg via ORAL
  Filled 2020-05-09 (×3): qty 1

## 2020-05-09 MED ORDER — OLANZAPINE 7.5 MG PO TABS
15.0000 mg | ORAL_TABLET | Freq: Every day | ORAL | Status: DC
  Filled 2020-05-09 (×2): qty 14
  Filled 2020-05-09: qty 2

## 2020-05-09 NOTE — [facility] Group Notes (Signed)
[facility] Group Notes:  (Nursing/MHT/Case Management/Adjunct)  Date:  05/09/2020  Time:  9:33 AM  Type of Therapy:  Group Therapy  Participation Level:  Minimal  Participation Quality:  Inattentive  Affect:  Flat  Cognitive:  Disorganized, Hallucinating and Lacking  Insight:  Lacking  Engagement in Group:  Lacking, Limited and Poor  Modes of Intervention:  Orientation  Summary of Progress/Problems: Pt is consistently responding to internal stimuli, hallucinating and also cannot seem to stay in topic when asked questions in group.   Shandrell Boda J Charne Mcbrien 05/09/2020, 9:33 AM

## 2020-05-09 NOTE — Tx Team (Signed)
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 acetaminophen (TYLENOL) tablet 650 mg  650 mg Oral Q6H PRN Lauro Franklin, MD   650 mg at 05/07/20 2018  . alum & mag hydroxide-simeth (MAALOX/MYLANTA) 200-200-20 MG/5ML suspension 30 mL  30 mL Oral Q4H PRN Lauro Franklin, MD      . amLODipine (NORVASC) tablet 10 mg  10 mg Oral Daily Antonieta Pert, MD   10 mg at 05/09/20 0824  . dextromethorphan-guaiFENesin (MUCINEX DM) 30-600 MG per 12 hr tablet 1 tablet  1 tablet Oral BID Mariel Craft, MD   1 tablet at 05/08/20 1633  . diphenhydrAMINE (BENADRYL) capsule 50 mg  50 mg Oral Q6H PRN Mariel Craft, MD       Or  . diphenhydrAMINE (BENADRYL) injection 50 mg  50 mg Intramuscular Q6H PRN Mariel Craft, MD      . folic acid (FOLVITE) tablet 1 mg  1 mg Oral Daily Antonieta Pert, MD      . haloperidol (HALDOL) tablet 5 mg  5 mg Oral Q8H PRN Mariel Craft, MD       Or  . haloperidol lactate (HALDOL) injection 5 mg  5 mg Intramuscular Q8H PRN Mariel Craft, MD      . LORazepam (ATIVAN) tablet 2 mg  2 mg Oral Q6H PRN Mariel Craft, MD       Or  . LORazepam (ATIVAN) injection 2 mg  2 mg Intramuscular Q6H PRN Mariel Craft, MD      . OLANZapine zydis [facility]) disintegrating tablet 10 mg  10 mg Oral Q8H PRN Mariel Craft, MD       And  . LORazepam  (ATIVAN) tablet 1 mg  1 mg Oral PRN Mariel Craft, MD       And  . ziprasidone (GEODON) injection 20 mg  20 mg Intramuscular PRN Mariel Craft, MD      . magnesium hydroxide (MILK OF MAGNESIA) suspension 30 mL  30 mL Oral Daily PRN Lauro Franklin, MD      . nicotine (NICODERM CQ - dosed in mg/24 hours) patch 21 mg  21 mg Transdermal Q0600 Lauro Franklin, MD   21 mg at 05/09/20 0824  . OLANZapine (ZYPREXA) tablet 15 mg  15 mg Oral Q supper Antonieta Pert, MD      . ondansetron (ZOFRAN-ODT) disintegrating tablet 8 mg  8 mg Oral Q8H PRN Mariel Craft, MD      . thiamine tablet 100 mg  100 mg Oral Daily Antonieta Pert, MD       PTA Medications: Medications Prior to Admission  Medication Sig Dispense Refill Last Dose  . acetaminophen (TYLENOL) 500 MG tablet Take 1,000 mg by mouth every 4 (four) hours as needed for moderate pain. (Patient not taking: Reported on 05/06/2020)   Not Taking at Unknown time  . acetaminophen-codeine (TYLENOL #3) 300-30 MG per tablet Take 1 tablet by  mouth every 6 (six) hours as needed for moderate pain. (Patient not taking: Reported on 05/22/2015) 15 tablet 0 Not Taking at Unknown time  . amoxicillin (AMOXIL) 500 MG capsule Take 1 capsule (500 mg total) by mouth 3 (three) times daily. (Patient not taking: Reported on 05/22/2015) 21 capsule 0 Not Taking at Unknown time  . amoxicillin-clavulanate (AUGMENTIN) 875-125 MG tablet Take 1 tablet by mouth every 12 (twelve) hours. (Patient not taking: Reported on 05/06/2020) 14 tablet 0 Not Taking at Unknown time  . cyclobenzaprine (FLEXERIL) 5 MG tablet Take 2 tablets (10 mg total) by mouth 2 (two) times daily as needed for muscle spasms. (Patient not taking: Reported on 05/06/2020) 20 tablet 0 Not Taking at Unknown time  . HYDROcodone-acetaminophen (NORCO/VICODIN) 5-325 MG tablet Take 1-2 tablets by mouth every 6 (six) hours as needed. (Patient not taking: Reported on 05/06/2020) 4 tablet 0 Not Taking at  Unknown time  . traMADol (ULTRAM) 50 MG tablet Take 1 tablet (50 mg total) by mouth every 6 (six) hours as needed. (Patient not taking: Reported on 05/06/2020) 10 tablet 0 Not Taking at Unknown time    Patient Stressors:    Patient Strengths:    Treatment Modalities: Medication Management, Group therapy, Case management,  1 to 1 session with clinician, Psychoeducation, Recreational therapy.   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) Long Term Goal(s): Improvement in symptoms so as ready for discharge Improvement in symptoms so as ready for discharge   Short Term Goals: Ability to identify changes in lifestyle to reduce recurrence of condition will improve Ability to verbalize feelings will improve Ability to disclose and discuss suicidal ideas Ability to demonstrate self-control will improve Ability to identify and develop effective coping behaviors will improve Ability to identify triggers associated with substance abuse/mental health issues will improve Ability to identify changes in lifestyle to reduce recurrence of condition will improve Ability to verbalize feelings will improve Ability to disclose and discuss suicidal ideas Ability to demonstrate self-control will improve Ability to identify and develop effective coping behaviors will improve Ability to identify triggers associated with substance abuse/mental health issues will improve  Medication Management: Evaluate patient's response, side effects, and tolerance of medication regimen.  Therapeutic Interventions: 1 to 1 sessions, Unit Group sessions and Medication administration.  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) Active Problems:   Psychoactive substance-induced psychosis  (HCC)   Elevated blood pressure reading   Tachycardia   Tobacco use disorder, moderate, dependence   Methamphetamine abuse (HCC)  Long Term Goal(s): Improvement in symptoms so as ready for discharge Improvement in symptoms so as ready for discharge   Short Term Goals: Ability to identify changes in lifestyle to reduce recurrence of condition will improve Ability to verbalize feelings will improve Ability to disclose and discuss suicidal ideas Ability to demonstrate self-control will improve Ability to identify and develop effective coping behaviors will improve Ability to identify triggers associated with substance abuse/mental health issues will improve Ability to identify changes in lifestyle to reduce recurrence of condition will improve Ability to verbalize feelings will improve Ability to disclose and discuss suicidal ideas Ability to demonstrate self-control will improve Ability to identify and develop effective coping behaviors will improve Ability to identify triggers associated with substance abuse/mental health issues will improve     Medication Management: Evaluate patient's response,  side effects, and tolerance of medication regimen.  Therapeutic Interventions: 1 to 1 sessions, Unit Group sessions and Medication administration.  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 RN will report any adverse and/or side effects to  prescribing provider.  Therapeutic Interventions: 1 on 1 counseling sessions, Psychoeducation, Medication administration, Evaluate responses to treatment, Monitor vital signs and CBGs as ordered, Perform/monitor CIWA, COWS, AIMS and Fall Risk screenings as ordered, Perform wound care treatments as ordered.  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  Short Term Goals: Engage patient in aftercare planning with referrals and resources, Increase social support, Identify triggers associated with mental health/substance abuse issues and Increase skills for wellness and recovery  Therapeutic Interventions: Assess for all discharge needs, 1 to 1 time with Social worker, Explore available resources and support systems, Assess for adequacy in community support network, Educate family and significant other(s) on suicide prevention, Complete Psychosocial Assessment, Interpersonal group therapy.  Evaluation of Outcomes: Adequate for Discharge   Progress in Treatment: Attending groups: Yes. Participating in groups: Yes. Taking medication as prescribed: Yes. Toleration medication: Yes. Family/Significant other contact made: No, will contact:  unable to reach contacts. SPE completed with patient. Patient understands diagnosis: Yes. Discussing patient identified problems/goals with staff: Yes. Medical problems stabilized or resolved: Yes. Denies suicidal/homicidal ideation: Yes. Issues/concerns per patient self-inventory: No.   New problem(s) identified: No, Describe:  none  New Short Term/Long Term Goal(s): medication stabilization, elimination of SI thoughts, development of comprehensive mental wellness plan.   Patient  Goals:  "To maintain, to relax and to be able to focus"  Discharge Plan or Barriers: Patient is to return home and is to follow up with [facility] for therapy and medication management.  Reason for Continuation of Hospitalization: Medication stabilization  Estimated Length of Stay: Adequate for discharge  Attendees: Patient: Anthony Wiggins 05/09/2020   Physician: Landry Mellow, MD 05/09/2020   Nursing:  05/09/2020   RN Care Manager: 05/09/2020   Social Worker: Ruthann Cancer, LCSW 05/09/2020   Recreational Therapist:  05/09/2020   Other:  05/09/2020  Other:  05/09/2020  Other: 05/09/2020     Scribe for Treatment Team: Otelia Santee, LCSW 05/09/2020 9:58 AM

## 2020-05-09 NOTE — Progress Notes (Signed)
Recreation Therapy Notes  Date: 11.15.21 Time: 1000 Location: 500 Hall Dayroom  Group Topic: Communication  Goal Area(s) Addresses:  Patient will effectively communicate with peers in group.  Patient will verbalize benefit of healthy communication. Patient will verbalize positive effect of healthy communication on post d/c goals.  Patient will identify communication techniques that made activity effective for group.   Behavioral Response: Engaged  Intervention: Geometrical shapes, pencils, blank paper  Activity: Geometrical Drawings.  One patient would come to the front of the group and describe a geometrical shapes drawing as detailed as possible.  The remaining group members would draw the picture how it is described to them.  The group members could only as the presenter to repeat themselves, they could not ask any detailed questions.  The process would continue until all 3 pictures have been presented.  Education: Communication, Discharge Planning  Education Outcome: Acknowledges understanding/In group clarification offered/Needs additional education.   Clinical Observations/Feedback:  Pt was engaged.  Pt described body language as a form of communication.  Pt did a good job presenting to peers.  Pt tried to be as specific as he could be.  Pt did exhibit some frustration when he felt his peers weren't understanding what he was saying but was able to work through it.  Pt explained one of the best ways to make sure others understand you is to "be as clear as possible".    Caroll Rancher, LRT/CTRS     Caroll Rancher A 05/09/2020 11:35 AM

## 2020-05-09 NOTE — Plan of Care (Signed)
Nurse discussed anxiety, depression and coping skills with patient.  

## 2020-05-09 NOTE — Progress Notes (Signed)
D:  Patient's self inventory sheet, patient sleeps good, no sleep medication.  Fair appetite, normal energy level, good concentration.  Denied anxiety, depression and hopeless.  Denied withdrawals.  Checked runny nose.  Denied SI.  Denied physical problems.  Denied physical pain.  Goal is remain calm and stay focused to keep self control.  Plans to relax at all times. A:  Medications administered per MD orders.  Emotional support and encouragement given patient. R:  Denied SI and HI, contracts for safety.  Denied A/V hallucinations.  Safety maintained with 15 minute checks.

## 2020-05-09 NOTE — [facility]HH Suicide Risk Assessment (Signed)
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  All other systems reviewed and are negative.   Blood pressure (!) 152/113, pulse 98, temperature 97.8 F (36.6 C), temperature source Oral, resp. rate 18, height 5\' 10"  (1.778 m), weight 74.8 kg.Body mass index is 23.68 kg/m.  General Appearance: Casual  Eye Contact::  Good  Speech:  Normal Rate409  Volume:  Normal  Mood:  Euthymic  Affect:  Congruent  Thought Process:  Coherent and Descriptions of Associations: Intact  Orientation:  Full (Time, Place, and Person)  Thought Content:  Logical  Suicidal Thoughts:  No  Homicidal Thoughts:  No  Memory:  Immediate;   Fair Recent;   Fair Remote;   Fair  Judgement:  Intact  Insight:  Fair  Psychomotor Activity:  Normal  Concentration:  Good  Recall:  Good  Fund of Knowledge:Good  Language: Good  Akathisia:  Negative  Handed:  Right  AIMS (if indicated):     Assets:  Desire for Improvement Housing Resilience Social Support  Sleep:  Number of Hours: 4.5  Cognition: WNL  ADL's:  Intact   Mental Status Per Nursing Assessment::   On Admission:  Self-harm behaviors  Demographic Factors:  Male, Low socioeconomic status and Unemployed  Loss Factors: NA  Historical Factors: Impulsivity  Risk Reduction Factors:   Living with  another person, especially a relative and Positive social support  Continued Clinical Symptoms:  Alcohol/Substance Abuse/Dependencies  Cognitive Features That Contribute To Risk:  None    Suicide Risk:  Minimal: No identifiable suicidal ideation.  Patients presenting with no risk factors but with morbid ruminations; may be classified as minimal risk based on the severity of the depressive symptoms    Plan Of Care/Follow-up recommendations:  Activity:  ad lib  [phone], MD 05/09/2020, 9:21 AM

## 2020-05-09 NOTE — Discharge Summary (Signed)
Physician Discharge Summary Note  Patient:  Anthony Wiggins is an 34 y.o., male MRN:  [id number] DOB:  02-07-1986 Patient phone:  [phone] (home)  Patient address:   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 Dental caries   . Schizophrenia (HCC)    History reviewed. No pertinent surgical history. Family History:  Family History  Problem Relation Age of Onset  . Diabetes Mother   . Hypertension Mother    Family Psychiatric  History: No Known Social History:  Social History   Substance and Sexual Activity  Alcohol Use Yes  . Alcohol/week: 4.0 standard drinks  . Types: 4 Cans of beer per week     Social History   Substance and Sexual Activity  Drug Use No    Social History   Socioeconomic History  . Marital status: Single    Spouse name: Not on file  . Number of children: Not on file  . Years of education: Not on file  . Highest education level: Not on file  Occupational History  . Not on file  Tobacco Use  . Smoking status: Current Every Day Smoker    Packs/day: 0.50    Types: Cigarettes  . Smokeless tobacco: Never Used  Substance and Sexual Activity  . Alcohol use: Yes    Alcohol/week: 4.0 standard drinks    Types: 4 Cans of beer  per week  . Drug use: No  . Sexual activity: Not on file  Other Topics Concern  . Not on file  Social History Narrative  . Not on file   Social Determinants of Health   Financial Resource Strain:   . Difficulty of Paying Living Expenses: Not on file  Food Insecurity:   . Worried About Programme researcher, broadcasting/film/video in the Last Year: Not on file  . Ran Out of Food in the Last Year: Not on file  Transportation Needs:   . Lack of Transportation (Medical): Not on file  . Lack of Transportation (Non-Medical): Not on file  Physical Activity:   . Days of Exercise per Week: Not on file  . Minutes of Exercise per Session: Not on file  Stress:   . Feeling of Stress : Not on file  Social Connections:   . Frequency of Communication with Friends and Family: Not on file  . Frequency of Social Gatherings with Friends and Family: Not on file  . Attends Religious Services: Not on file  . Active Member of Clubs or Organizations: Not on file  . Attends Banker Meetings: Not on file  . Marital Status: Not on file    Hospital Course:  Patient presented to the ED under IVC due to acute psychosis and paranoia. He required chemical and physical restraints. He was [facility] to [facility] on 11/13. He reported using Crack-Cocaine and Meth regular every 2-3 days. He reported his  last use must have been cut with something. He was observed for further disturbances or signs of withdrawal but did not display any. He requested substance rehab resources which were provided on discharge.  Physical Findings: AIMS: Facial and Oral Movements Muscles of Facial Expression: None, normal Lips and Perioral Area: None, normal Jaw: None, normal Tongue: None, normal,Extremity Movements Upper (arms, wrists, hands, fingers): None, normal Lower (legs, knees, ankles, toes): None, normal, Trunk Movements Neck, shoulders, hips: None, normal, Overall Severity Severity of abnormal movements (highest score from questions above):  None, normal Incapacitation due to abnormal movements: None, normal Patient's awareness of abnormal movements (rate only patient's report): No Awareness, Dental Status Current problems with teeth and/or dentures?: Yes Does patient usually wear dentures?: No  CIWA:    COWS:     Musculoskeletal: Strength & Muscle Tone: within normal limits Gait & Station: normal Patient leans: N/A  Psychiatric Specialty Exam: Physical Exam Vitals and nursing note reviewed.  Constitutional:      General: He is not in acute distress.    Appearance: Normal appearance. He is normal weight. He is not ill-appearing, toxic-appearing or diaphoretic.  HENT:     Head: Normocephalic and atraumatic.  Cardiovascular:     Rate and Rhythm: Normal rate.  Pulmonary:     Effort: Pulmonary effort is normal.  Abdominal:     Tenderness: There is no abdominal tenderness.  Musculoskeletal:        General: Normal range of motion.  Neurological:     General: No focal deficit present.     Mental Status: He is alert.     Review of Systems  Constitutional: Negative for fatigue and fever.  Respiratory: Negative for chest tightness and shortness of breath.   Cardiovascular: Negative for chest pain and palpitations.  Gastrointestinal: Negative for abdominal pain, constipation, diarrhea, nausea and vomiting.  Neurological: Negative for dizziness, light-headedness and headaches.  Psychiatric/Behavioral: Negative for agitation, hallucinations, self-injury, sleep disturbance and suicidal ideas.    Blood pressure (!) 152/113, pulse 98, temperature 97.8 F (36.6 C), temperature source Oral, resp. rate 18, height 5\' 10"  (1.778 m), weight 74.8 kg.Body mass index is 23.68 kg/m.  General Appearance: Fairly Groomed  Eye Contact:  Good  Speech:  Clear and Coherent and Normal Rate  Volume:  Normal  Mood:  Appropriate  Affect:  Appropriate  Thought Process:  Coherent and Goal Directed  Orientation:  Full (Time, Place, and  Person)  Thought Content:  Logical  Suicidal Thoughts:  No  Homicidal Thoughts:  No  Memory:  Immediate;   Good Recent;   Good  Judgement:  Fair  Insight:  Fair  Psychomotor Activity:  Normal  Concentration:  Concentration: Fair and Attention Span: Fair  Recall:  of Knowledge:  Fair  Language:  Good  Akathisia:  Negative  Handed:  Right  AIMS (if indicated):     Assets:  Housing Resilience  ADL's:  Intact  Cognition:  WNL  Sleep:  Number of Hours: 4.5     Have you used any form of tobacco in the last 30 days? (Cigarettes, Smokeless Tobacco, Cigars, and/or Pipes): Yes  Has this patient used any form of tobacco in the last 30 days? (Cigarettes, Smokeless Tobacco, Cigars, and/or Pipes) Yes, not interested in cessation  Blood Alcohol level:  Lab Results  Component Value Date   ETH <10 05/06/2020    Metabolic Disorder Labs:  Lab Results  Component Value Date   HGBA1C 6.1 (H) 05/08/2020  MPG 128.37 05/08/2020   No results found for: PROLACTIN Lab Results  Component Value Date   CHOL 193 05/08/2020   TRIG 72 05/08/2020   HDL 53 05/08/2020   CHOLHDL 3.6 05/08/2020   VLDL 14 05/08/2020   LDLCALC 126 (H) 05/08/2020    See Psychiatric Specialty Exam and Suicide Risk Assessment completed by Attending Physician prior to discharge.  Discharge destination:  Home  Is patient on multiple antipsychotic therapies at discharge:  No   Has Patient had three or more failed trials of antipsychotic monotherapy by history:  No  Recommended Plan for Multiple Antipsychotic Therapies: NA  Discharge Instructions    Diet - low sodium heart healthy   Complete by: As directed    Increase activity slowly   Complete by: As directed    Increase activity slowly   Complete by: As directed      Allergies as of 05/09/2020      Reactions   Naproxen Rash      Medication List    STOP taking these medications   acetaminophen 500 MG tablet Commonly known as: TYLENOL    acetaminophen-codeine 300-30 MG tablet Commonly known as: TYLENOL #3   amoxicillin 500 MG capsule Commonly known as: AMOXIL   amoxicillin-clavulanate 875-125 MG tablet Commonly known as: AUGMENTIN   cyclobenzaprine 5 MG tablet Commonly known as: FLEXERIL   HYDROcodone-acetaminophen 5-325 MG tablet Commonly known as: NORCO/VICODIN   traMADol 50 MG tablet Commonly known as: ULTRAM     TAKE these medications     Indication  amLODipine 10 MG tablet Commonly known as: NORVASC Take 1 tablet (10 mg total) by mouth daily. Start taking on: May 10, 2020  Indication: High Blood Pressure Disorder   OLANZapine 15 MG tablet Commonly known as: ZYPREXA Take 1 tablet (15 mg total) by mouth daily with supper.  Indication: psychosis       Follow-up Information    [facility] Follow up.   Specialty: Behavioral Health Why: You have a walk in appointment for therapy services on   You also have an appointment for medication management services on   These appointments will be held in person. Contact information: [address] [place] [address] [phone]              Follow-up recommendations: - Activity as tolerated. - Diet as recommended by PCP. - Keep all scheduled follow-up appointments as recommended. - Obtain Liver Function Labs - Obtain Renal Function Labs   Comments:  Please make sure to get Liver Function and Renal Function labs at follow up.  Signed: Lauro Franklin, MD 05/09/2020, 9:35 AM

## 2020-05-09 NOTE — BHH Suicide Risk Assessment (Signed)
[facility] INPATIENT:  Family/Significant Other Suicide Prevention Education  Suicide Prevention Education:  Contact Attempts: Significant Other, Crystal Mayo [phone]) has been identified by the patient as the family member/significant other with whom the patient will be residing, and identified as the person(s) who will aid the patient in the event of a mental health crisis.  With written consent from the patient, two attempts were made to provide suicide prevention education, prior to and/or following the patient's discharge.  We were unsuccessful in providing suicide prevention education.  A suicide education pamphlet was given to the patient to share with family/significant other.   Date and time of first attempt: 05/09/20 at 9:50am CSW unable to leave voicemail.  Date and time of second attempt: Due to this patient discharging on this date second attempt is unable to be made.   Unable to reach supports, SPE pamphlet placed on chart for patient to share with supports at discharge.    Ruthann Cancer MSW, LCSW Clincal Social Worker  [facility]

## 2020-05-09 NOTE — Progress Notes (Signed)
Discharge Note:  Patient discharged home with family member.  Patient denied SI and HI.  Denied A/V hallucinations.  Suicide prevention information given and discussed with patient who stated he understood and had no questions.  Patient stated he received all his belongings, clothing, toiletries, misc items, etc.  Patient stated he appreciated all assistance received from [facility] staf.  All required discharge information given to patient.

## 2020-05-09 NOTE — Progress Notes (Signed)
  [facility] Adult Case Management Discharge Plan :  Will you be returning to the same living situation after discharge:  Yes,  to home At discharge, do you have transportation home?: Yes,  mother to pick patient up Do you have the ability to pay for your medications: No. Samples to be provided at discharge  Release of information consent forms completed and in the chart;  Patient's signature needed at discharge.  Patient to Follow up at:  Follow-up Information    [facility] Follow up.   Specialty: Behavioral Health Why: You have a walk in appointment for therapy services on   You also have an appointment for medication management services on   These appointments will be held in person. Contact information: [address] [place] [address] [phone]              Next level of care provider has access to [facility] Link:no  Safety Planning and Suicide Prevention discussed: Yes,  patient  Have you used any form of tobacco in the last 30 days? (Cigarettes, Smokeless Tobacco, Cigars, and/or Pipes): Yes  Has patient been referred to the Quitline?: Patient refused referral  Patient has been referred for addiction treatment: Pt. refused referral  Otelia Santee, LCSW 05/09/2020, 9:56 AM

## 2020-05-09 NOTE — Progress Notes (Signed)
Patient had to be moved back to his original room because he was annoying his roommate to the point his roomate became irritable and agitated that he was up and down all night trying to talk to him and sitting up watching him. Patient is very bizarre in his behavior and paranoid. He is unaware that what he was doing in the room with a roommate was wrong.

## 2020-05-24 ENCOUNTER — Other Ambulatory Visit: Payer: Self-pay

## 2020-05-24 ENCOUNTER — Encounter (HOSPITAL_COMMUNITY): Payer: Self-pay

## 2020-05-24 ENCOUNTER — Emergency Department (HOSPITAL_COMMUNITY)
Admission: EM | Admit: 2020-05-24 | Discharge: 2020-05-24 | Disposition: A | Discharge status: Home / Self Care | Payer: Self-pay | Attending: Emergency Medicine | Admitting: Emergency Medicine

## 2020-05-24 DIAGNOSIS — F17210 Nicotine dependence, cigarettes, uncomplicated: Secondary | ICD-10-CM | POA: Insufficient documentation

## 2020-05-24 DIAGNOSIS — F1410 Cocaine abuse, uncomplicated: Secondary | ICD-10-CM | POA: Insufficient documentation

## 2020-05-24 DIAGNOSIS — F1910 Other psychoactive substance abuse, uncomplicated: Secondary | ICD-10-CM | POA: Insufficient documentation

## 2020-05-24 LAB — CBC WITH DIFFERENTIAL/PLATELET
Abs Immature Granulocytes: 0.02 10*3/uL (ref 0.00–0.07)
Basophils Absolute: 0 10*3/uL (ref 0.0–0.1)
Basophils Relative: 0 %
Eosinophils Absolute: 0.1 10*3/uL (ref 0.0–0.5)
Eosinophils Relative: 1 %
HCT: 33.9 % — ABNORMAL LOW (ref 39.0–52.0)
Hemoglobin: 11 g/dL — ABNORMAL LOW (ref 13.0–17.0)
Immature Granulocytes: 0 %
Lymphocytes Relative: 23 %
Lymphs Abs: 1.6 10*3/uL (ref 0.7–4.0)
MCH: 27 pg (ref 26.0–34.0)
MCHC: 32.4 g/dL (ref 30.0–36.0)
MCV: 83.3 fL (ref 80.0–100.0)
Monocytes Absolute: 0.3 10*3/uL (ref 0.1–1.0)
Monocytes Relative: 4 %
Neutro Abs: 5 10*3/uL (ref 1.7–7.7)
Neutrophils Relative %: 72 %
Platelets: 290 10*3/uL (ref 150–400)
RBC: 4.07 MIL/uL — ABNORMAL LOW (ref 4.22–5.81)
RDW: 13.6 % (ref 11.5–15.5)
WBC: 7 10*3/uL (ref 4.0–10.5)
nRBC: 0 % (ref 0.0–0.2)

## 2020-05-24 LAB — URINALYSIS, ROUTINE W REFLEX MICROSCOPIC
Bilirubin Urine: NEGATIVE
Glucose, UA: NEGATIVE mg/dL
Hgb urine dipstick: NEGATIVE
Ketones, ur: NEGATIVE mg/dL
Leukocytes,Ua: NEGATIVE
Nitrite: NEGATIVE
Protein, ur: NEGATIVE mg/dL
Specific Gravity, Urine: 1.005 (ref 1.005–1.030)
pH: 6 (ref 5.0–8.0)

## 2020-05-24 LAB — COMPREHENSIVE METABOLIC PANEL
ALT: 15 U/L (ref 0–44)
AST: 15 U/L (ref 15–41)
Albumin: 3.6 g/dL (ref 3.5–5.0)
Alkaline Phosphatase: 41 U/L (ref 38–126)
Anion gap: 10 (ref 5–15)
BUN: 15 mg/dL (ref 6–20)
CO2: 21 mmol/L — ABNORMAL LOW (ref 22–32)
Calcium: 8 mg/dL — ABNORMAL LOW (ref 8.9–10.3)
Chloride: 110 mmol/L (ref 98–111)
Creatinine, Ser: 1.7 mg/dL — ABNORMAL HIGH (ref 0.61–1.24)
GFR, Estimated: 54 mL/min — ABNORMAL LOW (ref >60)
Glucose, Bld: 104 mg/dL — ABNORMAL HIGH (ref 70–99)
Potassium: 3.6 mmol/L (ref 3.5–5.1)
Sodium: 141 mmol/L (ref 135–145)
Total Bilirubin: 0.5 mg/dL (ref 0.3–1.2)
Total Protein: 6.4 g/dL — ABNORMAL LOW (ref 6.5–8.1)

## 2020-05-24 LAB — RAPID URINE DRUG SCREEN, HOSP PERFORMED
Amphetamines: POSITIVE — AB
Barbiturates: NOT DETECTED
Benzodiazepines: NOT DETECTED
Cocaine: POSITIVE — AB
Opiates: POSITIVE — AB
Tetrahydrocannabinol: POSITIVE — AB

## 2020-05-24 LAB — ETHANOL: Alcohol, Ethyl (B): <10 mg/dL (ref <10)

## 2020-05-24 MED ORDER — SODIUM CHLORIDE 0.9 % IV BOLUS
1000.0000 mL | Freq: Once | INTRAVENOUS | Status: AC
  Administered 2020-05-24: 1000 mL via INTRAVENOUS

## 2020-05-24 NOTE — ED Notes (Signed)
Patient is awake, alert and oriented, able to ambulate and tolerate PO.

## 2020-05-24 NOTE — ED Triage Notes (Signed)
Pt arrived via EMS, from motel that he is currently living at, motel staff called for concern of OD, pt was nodding off in lobby, not responding to staff. Pt slightly combative with EMS, given 5mg  Haldol IV with good effect. Pt took unknown amt of unknown substance. Nodding off in triage. SPo2 87% when pt falls asleep. Pt arousable to voice.

## 2020-05-24 NOTE — ED Provider Notes (Signed)
[facility]-EMERGENCY DEPT Provider Note   CSN: [id number] Arrival date & time: 05/24/20  1726     History Chief Complaint  Patient presents with   Drug Problem    Anthony Wiggins is a 34 y.o. male.  Presents to ER with concern for possible overdose.  [facility] staff called after patient was found with decreased responsiveness.  EMS reported that he was somewhat combative and was given Haldol.  Unsure what patient may have taken.  Patient states that he does not remember what happened.  He denies any medical complaints.  He denies any thoughts of hurting himself or hurting others.  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 No pertinent surgical history.     Family History  Problem Relation Age of Onset   Diabetes Mother    Hypertension Mother     Social History   Tobacco Use   Smoking status: Current Every Day Smoker    Packs/day: 0.50    Types: Cigarettes   Smokeless tobacco: Never Used  Substance Use Topics   Alcohol use: Yes    Alcohol/week: 4.0 standard drinks    Types: 4 Cans of beer per week   Drug use: No    Home Medications Prior to Admission medications   Medication Sig Start Date End Date Taking? Authorizing Provider  acetaminophen (TYLENOL) 500 MG tablet Take 500 mg by mouth every 6 (six) hours as needed.   Yes [provider]  amLODipine (NORVASC) 10 MG tablet Take 1 tablet (10 mg total) by mouth daily. 05/10/20  Yes Antonieta Pert, MD  OLANZapine (ZYPREXA) 15 MG tablet  Take 1 tablet (15 mg total) by mouth daily with supper. 05/09/20  Yes Antonieta Pert, MD    Allergies    Naproxen  Review of Systems   Review of Systems  Constitutional: Negative for chills and fever.  HENT: Negative for ear pain and sore throat.   Eyes: Negative for pain and visual disturbance.  Respiratory: Negative for cough and shortness of breath.   Cardiovascular: Negative for chest pain and palpitations.  Gastrointestinal: Negative for abdominal pain and vomiting.  Genitourinary: Negative for dysuria and hematuria.  Musculoskeletal: Negative for arthralgias and back pain.  Skin: Negative for color change and rash.  Neurological: Negative for seizures and syncope.  All other systems reviewed and are negative.   Physical Exam Updated Vital Signs BP 120/82 (BP Location: Right Arm)    Pulse 80    Temp 97.6 F (36.4 C) (Oral)    Resp 18    SpO2 100%   Physical Exam Vitals and nursing note reviewed.  Constitutional:      Appearance: He is well-developed.     Comments: Mildly lethargic but easily aroused, oriented x4  HENT:     Head: Normocephalic and atraumatic.  Eyes:     Conjunctiva/sclera: Conjunctivae normal.  Cardiovascular:     Rate and Rhythm: Normal rate and regular rhythm.     Heart sounds: No murmur heard.   Pulmonary:     Effort: Pulmonary  effort is normal. No respiratory distress.     Breath sounds: Normal breath sounds.  Abdominal:     Palpations: Abdomen is soft.     Tenderness: There is no abdominal tenderness.  Musculoskeletal:     Cervical back: Neck supple.  Skin:    General: Skin is warm and dry.  Neurological:     Mental Status: He is alert.     Comments: Mildly lethargic but oriented x 4     ED Results / Procedures / Treatments   Labs (all labs ordered are listed, but only abnormal results are displayed) Labs Reviewed  CBC WITH DIFFERENTIAL/PLATELET - Abnormal; Notable for the following components:      Result Value   RBC 4.07 (*)     Hemoglobin 11.0 (*)    HCT 33.9 (*)    All other components within normal limits  COMPREHENSIVE METABOLIC PANEL - Abnormal; Notable for the following components:   CO2 21 (*)    Glucose, Bld 104 (*)    Creatinine, Ser 1.70 (*)    Calcium 8.0 (*)    Total Protein 6.4 (*)    GFR, Estimated 54 (*)    All other components within normal limits  RAPID URINE DRUG SCREEN, HOSP PERFORMED - Abnormal; Notable for the following components:   Opiates POSITIVE (*)    Cocaine POSITIVE (*)    Amphetamines POSITIVE (*)    Tetrahydrocannabinol POSITIVE (*)    All other components within normal limits  URINALYSIS, ROUTINE W REFLEX MICROSCOPIC - Abnormal; Notable for the following components:   Color, Urine STRAW (*)    All other components within normal limits  ETHANOL  CBG MONITORING, ED    EKG None  Radiology No results found.  Procedures Procedures (including critical care time)  Medications Ordered in ED Medications  sodium chloride 0.9 % bolus 1,000 mL ( Intravenous Stopped 05/24/20 2103)    ED Course  I have reviewed the triage vital signs and the nursing notes.  Pertinent labs & imaging results that were available during my care of the patient were reviewed by me and considered in my medical decision making (see chart for details).  Clinical Course as of May 24 2324  Tue May 24, 2020  2145 Suspect polysubstance abuse source of his presentation tonight  Rapid urine drug screen (hospital performed)(!) [RD]  2236 No ongoing symptoms, wishes to go home.   [RD]    Clinical Course User Index [RD] Milagros Loll, MD   MDM Rules/Calculators/A&P                          34 year old male presents to ER after there was concern for overdose.  Patient was mildly lethargic but easily aroused, oriented x3 on arrival.  Basic labs were within normal limits, noted elevation in Cr, similar to prior, suspect CKD. UDS concerning for multiple substances.  Was observed in ER, he had  complete resolution of symptoms.  No SI, HI, AVH.  Vital stable.  Counseled regarding illicit substance abuse cessation.  Discharge home.  After the discussed management above, the patient was determined to be safe for discharge.  The patient was in agreement with this plan and all questions regarding their care were answered.  ED return precautions were discussed and the patient will return to the ED with any significant worsening of condition.  Final Clinical Impression(s) / ED Diagnoses Final diagnoses:  Polysubstance abuse (HCC)    Rx / DC  Orders ED Discharge Orders    None       Milagros Loll, MD 05/24/20 2325

## 2020-05-24 NOTE — Discharge Instructions (Addendum)
Recommend stopping your illicit drug use.  Return to ER if you develop chest pain, difficulty breathing or other new concerns.  Follow-up with your primary doctor, discuss monitoring of your kidney function.

## 2020-05-24 NOTE — ED Notes (Signed)
Pt had a sandwich, water and ginger ale.

## 2020-05-28 ENCOUNTER — Emergency Department (HOSPITAL_COMMUNITY): Payer: Self-pay

## 2020-05-28 ENCOUNTER — Encounter (HOSPITAL_COMMUNITY): Payer: Self-pay

## 2020-05-28 ENCOUNTER — Emergency Department (HOSPITAL_COMMUNITY)
Admission: EM | Admit: 2020-05-28 | Discharge: 2020-05-28 | Disposition: A | Discharge status: Home / Self Care | Payer: Self-pay | Attending: Emergency Medicine | Admitting: Emergency Medicine

## 2020-05-28 DIAGNOSIS — F1910 Other psychoactive substance abuse, uncomplicated: Secondary | ICD-10-CM

## 2020-05-28 DIAGNOSIS — F17210 Nicotine dependence, cigarettes, uncomplicated: Secondary | ICD-10-CM | POA: Insufficient documentation

## 2020-05-28 DIAGNOSIS — T50901A Poisoning by unspecified drugs, medicaments and biological substances, accidental (unintentional), initial encounter: Secondary | ICD-10-CM | POA: Insufficient documentation

## 2020-05-28 DIAGNOSIS — Z79899 Other long term (current) drug therapy: Secondary | ICD-10-CM | POA: Insufficient documentation

## 2020-05-28 LAB — COMPREHENSIVE METABOLIC PANEL
ALT: 14 U/L (ref 0–44)
AST: 15 U/L (ref 15–41)
Albumin: 3.5 g/dL (ref 3.5–5.0)
Alkaline Phosphatase: 46 U/L (ref 38–126)
Anion gap: 10 (ref 5–15)
BUN: 13 mg/dL (ref 6–20)
CO2: 23 mmol/L (ref 22–32)
Calcium: 8.2 mg/dL — ABNORMAL LOW (ref 8.9–10.3)
Chloride: 107 mmol/L (ref 98–111)
Creatinine, Ser: 1.13 mg/dL (ref 0.61–1.24)
GFR, Estimated: >60 mL/min (ref >60)
Glucose, Bld: 99 mg/dL (ref 70–99)
Potassium: 3.3 mmol/L — ABNORMAL LOW (ref 3.5–5.1)
Sodium: 140 mmol/L (ref 135–145)
Total Bilirubin: 0.4 mg/dL (ref 0.3–1.2)
Total Protein: 6.3 g/dL — ABNORMAL LOW (ref 6.5–8.1)

## 2020-05-28 LAB — CBC WITH DIFFERENTIAL/PLATELET
Abs Immature Granulocytes: 0.01 10*3/uL (ref 0.00–0.07)
Basophils Absolute: 0 10*3/uL (ref 0.0–0.1)
Basophils Relative: 0 %
Eosinophils Absolute: 0.2 10*3/uL (ref 0.0–0.5)
Eosinophils Relative: 3 %
HCT: 33.3 % — ABNORMAL LOW (ref 39.0–52.0)
Hemoglobin: 10.4 g/dL — ABNORMAL LOW (ref 13.0–17.0)
Immature Granulocytes: 0 %
Lymphocytes Relative: 34 %
Lymphs Abs: 2.3 10*3/uL (ref 0.7–4.0)
MCH: 26.1 pg (ref 26.0–34.0)
MCHC: 31.2 g/dL (ref 30.0–36.0)
MCV: 83.7 fL (ref 80.0–100.0)
Monocytes Absolute: 0.3 10*3/uL (ref 0.1–1.0)
Monocytes Relative: 5 %
Neutro Abs: 3.9 10*3/uL (ref 1.7–7.7)
Neutrophils Relative %: 58 %
Platelets: 271 10*3/uL (ref 150–400)
RBC: 3.98 MIL/uL — ABNORMAL LOW (ref 4.22–5.81)
RDW: 13.9 % (ref 11.5–15.5)
WBC: 6.8 10*3/uL (ref 4.0–10.5)
nRBC: 0 % (ref 0.0–0.2)

## 2020-05-28 LAB — RAPID URINE DRUG SCREEN, HOSP PERFORMED
Amphetamines: POSITIVE — AB
Barbiturates: NOT DETECTED
Benzodiazepines: NOT DETECTED
Cocaine: POSITIVE — AB
Opiates: NOT DETECTED
Tetrahydrocannabinol: POSITIVE — AB

## 2020-05-28 MED ORDER — SODIUM CHLORIDE 0.9 % IV BOLUS
1000.0000 mL | Freq: Once | INTRAVENOUS | Status: AC
  Administered 2020-05-28: 1000 mL via INTRAVENOUS

## 2020-05-28 NOTE — ED Provider Notes (Signed)
  Physical Exam  BP 122/80 (BP Location: Right Arm)   Pulse 76   Temp 97.6 F (36.4 C) (Oral)   Resp 11   SpO2 100%   Physical Exam  ED Course/Procedures     Procedures  MDM  Received patient signout.  Likely overdose.  Trouble staying awake.  Reevaluated and now still sleepy.  We will continue to monitor       Benjiman Core, MD 05/28/20 1749

## 2020-05-28 NOTE — ED Triage Notes (Signed)
Pt presents with c/o probable OD. EMS was [facility] to a motel for concerns of OD, found pt sitting up on the ground upon arrival. Pt was having trouble staying awake for EMS, did have a period of semi-unresponsiveness in the ambulance which resolved with Narcan. Pt is sleepy but arousable.

## 2020-05-28 NOTE — ED Provider Notes (Signed)
[facility]PT Provider Note   CSN: [id number] Arrival date & time: 05/28/20  1046     History Chief Complaint  Patient presents with  . Ingestion    Anthony Wiggins is a 34 y.o. male.  Patient was brought in by the paramedics for potential drug overdose.  He was found lethargic.  Patient was given Narcan and woke up some.  The history is provided by the patient and the EMS personnel. No language interpreter was used.  Ingestion This is a recurrent problem. The current episode started less than 1 hour ago. The problem occurs constantly. The problem has been gradually improving. Pertinent negatives include no chest pain. Nothing aggravates the symptoms. Nothing relieves the symptoms.       Past Medical History:  Diagnosis Date  . Dental caries   . Schizophrenia [facility])     Patient Active Problem List   Diagnosis Date Noted  . 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 Elevated blood pressure reading 05/07/2020  . Tachycardia 05/07/2020  . Tobacco use disorder, moderate, dependence 05/07/2020  . 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 Methamphetamine abuse (HCC) 05/07/2020    History reviewed. No pertinent surgical history.     Family History  Problem Relation Age of Onset  . Diabetes Mother   . Hypertension Mother     Social History   Tobacco Use  . Smoking status: Current Every Day Smoker    Packs/day: 0.50    Types: Cigarettes  . Smokeless tobacco: Never Used  Substance Use Topics  . Alcohol use: Yes    Alcohol/week: 4.0 standard drinks    Types: 4 Cans of beer per week  . Drug use: No    Home Medications Prior to Admission medications   Medication Sig Start Date End Date Taking? Authorizing Provider  acetaminophen (TYLENOL) 500 MG tablet Take 500 mg by mouth every 6 (six) hours as needed.    [provider]  amLODipine (NORVASC) 10 MG tablet  Take 1 tablet (10 mg total) by mouth daily. 05/10/20   Antonieta Pert, MD  OLANZapine (ZYPREXA) 15 MG tablet Take 1 tablet (15 mg total) by mouth daily with supper. 05/09/20   Antonieta Pert, MD    Allergies    Naproxen  Review of Systems   Review of Systems  Unable to perform ROS: Mental status change  Cardiovascular: Negative for chest pain.    Physical Exam Updated Vital Signs BP 129/85 (BP Location: Right Arm)   Pulse 61   Temp (!) 97.5 F (36.4 C) (Oral)   Resp (!) 22   SpO2 98%   Physical Exam Vitals and nursing note reviewed.  Constitutional:      Appearance: He is well-developed.     Comments: Lethargic  HENT:     Head: Normocephalic.     Right Ear: Tympanic membrane normal.     Nose: Nose normal.  Eyes:     General: No scleral icterus.    Conjunctiva/sclera: Conjunctivae normal.  Neck:     Thyroid: No thyromegaly.  Cardiovascular:     Rate and Rhythm: Normal rate and regular rhythm.     Heart sounds: No murmur heard.  No friction rub. No gallop.   Pulmonary:     Breath sounds: No stridor. No wheezing or rales.  Chest:     Chest wall: No tenderness.  Abdominal:     General: There is no distension.  Tenderness: There is no abdominal tenderness. There is no rebound.  Musculoskeletal:        General: Normal range of motion.     Cervical back: Neck supple.  Lymphadenopathy:     Cervical: No cervical adenopathy.  Skin:    Findings: No erythema or rash.  Neurological:     Motor: No abnormal muscle tone.     Coordination: Coordination normal.     Comments: Patient very lethargic but is arousable and is oriented to person and place  Psychiatric:     Comments: Lethargic     ED Results / Procedures / Treatments   Labs (all labs ordered are listed, but only abnormal results are displayed) Labs Reviewed  CBC WITH DIFFERENTIAL/PLATELET - Abnormal; Notable for the following components:      Result Value   RBC 3.98 (*)    Hemoglobin 10.4 (*)      HCT 33.3 (*)    All other components within normal limits  COMPREHENSIVE METABOLIC PANEL - Abnormal; Notable for the following components:   Potassium 3.3 (*)    Calcium 8.2 (*)    Total Protein 6.3 (*)    All other components within normal limits  RAPID URINE DRUG SCREEN, HOSP PERFORMED - Abnormal; Notable for the following components:   Cocaine POSITIVE (*)    Amphetamines POSITIVE (*)    Tetrahydrocannabinol POSITIVE (*)    All other components within normal limits    EKG None  Radiology DG Chest 2 View  Result Date: 05/28/2020 CLINICAL DATA:  Weakness, probable overdose. EXAM: CHEST - 2 VIEW COMPARISON:  Chest x-ray dated 08/17/2012 FINDINGS: Heart size and mediastinal contours are within normal limits. Lungs are clear. No pleural effusion or pneumothorax is seen. Osseous structures about the chest are unremarkable. IMPRESSION: No active cardiopulmonary disease. Electronically Signed   By: Bary Richard M.D.   On: 05/28/2020 11:58   CT Head Wo Contrast  Result Date: 05/28/2020 CLINICAL DATA:  Possible overdose.  Altered mental status. EXAM: CT HEAD WITHOUT CONTRAST TECHNIQUE: Contiguous axial images were obtained from the base of the skull through the vertex without intravenous contrast. COMPARISON:  08/29/2005 FINDINGS: Brain: No evidence of acute infarction, hemorrhage, hydrocephalus, extra-axial collection or mass lesion/mass effect. Vascular: No hyperdense vessel or unexpected calcification. Skull: Normal. Negative for fracture or focal lesion. Sinuses/Orbits: Globes and orbits are unremarkable. Sinuses are clear. Other: None. IMPRESSION: Normal unenhanced CT scan of the brain. Electronically Signed   By: Amie Portland M.D.   On: 05/28/2020 12:03    Procedures Procedures (including critical care time)  Medications Ordered in ED Medications  sodium chloride 0.9 % bolus 1,000 mL (0 mLs Intravenous Stopped 05/28/20 1307)    ED Course  I have reviewed the triage vital signs  and the nursing notes.  Pertinent labs & imaging results that were available during my care of the patient were reviewed by me and considered in my medical decision making (see chart for details).    MDM Rules/Calculators/A&P                          Patient with drug abuse.  Patient lethargic and has been taking cocaine and amphetamines and marijuana.  I suspect his lethargy is related to coming down off the amphetamines Final Clinical Impression(s) / ED Diagnoses Final diagnoses:  None    Rx / DC Orders ED Discharge Orders    None       Bethann Berkshire,  MD 05/30/20 1009

## 2020-05-28 NOTE — ED Notes (Signed)
Provided pt with sandwich. 

## 2020-06-03 ENCOUNTER — Encounter (HOSPITAL_COMMUNITY): Payer: Federal, State, Local not specified - Other | Admitting: Physician Assistant

## 2022-06-09 ENCOUNTER — Ambulatory Visit (HOSPITAL_COMMUNITY)
Admission: RE | Admit: 2022-06-09 | Discharge: 2022-06-09 | Disposition: A | Discharge status: Home / Self Care | Payer: Federal, State, Local not specified - Other | Attending: Psychiatry | Admitting: Psychiatry

## 2022-06-09 ENCOUNTER — Encounter (HOSPITAL_COMMUNITY): Payer: Self-pay

## 2022-06-09 ENCOUNTER — Emergency Department (HOSPITAL_COMMUNITY)
Admission: EM | Admit: 2022-06-09 | Discharge: 2022-06-09 | Discharge status: Against Medical Advice | Payer: Medicaid Other | Attending: Emergency Medicine | Admitting: Emergency Medicine

## 2022-06-09 DIAGNOSIS — F19959 Other psychoactive substance use, unspecified with psychoactive substance-induced psychotic disorder, unspecified: Secondary | ICD-10-CM | POA: Diagnosis not present

## 2022-06-09 DIAGNOSIS — F22 Delusional disorders: Secondary | ICD-10-CM | POA: Insufficient documentation

## 2022-06-09 DIAGNOSIS — Z5321 Procedure and treatment not carried out due to patient leaving prior to being seen by health care provider: Secondary | ICD-10-CM | POA: Diagnosis not present

## 2022-06-09 DIAGNOSIS — F1510 Other stimulant abuse, uncomplicated: Secondary | ICD-10-CM | POA: Diagnosis present

## 2022-06-09 LAB — CBC WITH DIFFERENTIAL/PLATELET
Abs Immature Granulocytes: 0.04 10*3/uL (ref 0.00–0.07)
Basophils Absolute: 0.1 10*3/uL (ref 0.0–0.1)
Basophils Relative: 1 %
Eosinophils Absolute: 0.1 10*3/uL (ref 0.0–0.5)
Eosinophils Relative: 1 %
HCT: 42 % (ref 39.0–52.0)
Hemoglobin: 13.6 g/dL (ref 13.0–17.0)
Immature Granulocytes: 0 %
Lymphocytes Relative: 29 %
Lymphs Abs: 2.8 10*3/uL (ref 0.7–4.0)
MCH: 26 pg (ref 26.0–34.0)
MCHC: 32.4 g/dL (ref 30.0–36.0)
MCV: 80.3 fL (ref 80.0–100.0)
Monocytes Absolute: 0.5 10*3/uL (ref 0.1–1.0)
Monocytes Relative: 5 %
Neutro Abs: 6.1 10*3/uL (ref 1.7–7.7)
Neutrophils Relative %: 64 %
Platelets: 337 10*3/uL (ref 150–400)
RBC: 5.23 MIL/uL (ref 4.22–5.81)
RDW: 13.3 % (ref 11.5–15.5)
WBC: 9.5 10*3/uL (ref 4.0–10.5)
nRBC: 0 % (ref 0.0–0.2)

## 2022-06-09 LAB — COMPREHENSIVE METABOLIC PANEL
ALT: 18 U/L (ref 0–44)
AST: 23 U/L (ref 15–41)
Albumin: 4.5 g/dL (ref 3.5–5.0)
Alkaline Phosphatase: 58 U/L (ref 38–126)
Anion gap: 9 (ref 5–15)
BUN: 17 mg/dL (ref 6–20)
CO2: 27 mmol/L (ref 22–32)
Calcium: 9.3 mg/dL (ref 8.9–10.3)
Chloride: 104 mmol/L (ref 98–111)
Creatinine, Ser: 1.14 mg/dL (ref 0.61–1.24)
GFR, Estimated: >60 mL/min (ref >60)
Glucose, Bld: 111 mg/dL — ABNORMAL HIGH (ref 70–99)
Potassium: 3.2 mmol/L — ABNORMAL LOW (ref 3.5–5.1)
Sodium: 140 mmol/L (ref 135–145)
Total Bilirubin: 0.5 mg/dL (ref 0.3–1.2)
Total Protein: 8 g/dL (ref 6.5–8.1)

## 2022-06-09 LAB — ETHANOL: Alcohol, Ethyl (B): <10 mg/dL (ref <10)

## 2022-06-09 MED ORDER — OLANZAPINE 5 MG PO TABS: 5.0000 mg | ORAL_TABLET | Freq: Every day | ORAL | Status: DC

## 2022-06-09 NOTE — Consult Note (Signed)
[facility] ED ASSESSMENT   Reason for Consult:  Psychiatric Consult Referring Physician:  Margaretmary Lombard PA-C Patient Identification: Anthony Wiggins MRN:  [id number] ED Chief Complaint: Psychoactive substance-induced psychosis [facility])  Diagnosis:  Principal Problem:   Psychoactive substance-induced psychosis ([place]) Active Problems:   Methamphetamine abuse [facility])   ED Assessment Time Calculation: Start Time: 1630 Stop Time: 1700 Total Time in Minutes (Assessment Completion): 30   Subjective:   Anthony Wiggins is a 36 y.o. male patient transferred to here from [facility] due to recommendation of medical clearance.  HPI:  Anthony Wiggins, 36 y.o., male patient seen face to face by this provider, chart reviewed on 06/09/22. On evaluation Anthony Wiggins when asked why he was brought to the hospital he stated "I hadn't got enough sleep, when I wake up never had nightmares, since I was a kid and now I am having dreams and nightmares." Patient stated he used "ice" methamphetamines 48 hrs ago but he has been using it for 2 yrs. Patient reports "ice" is the only substance he uses. Patient states he does not work, he lives between his mother and baby-mother's house, where he has 3 little boys. Patient said sometimes his dreams are so real, but he is still asleep and he will wake up and go to his kids room and check on them. Patient states he is stressed sometimes about not having a job, and helping his children. Patient wants to be seen on an outpatient basis.    During evaluation KARNVIR CEJAS is sitting in the hospital chair in no acute distress.  He is alert, oriented x 4, calm, cooperative, he speaks a lot about dreams and nightmares, and how he did not use to dream, provider asked him if the dreams scare him he said sometimes when they are nightmares. His mood is euthymic with congruent affect. He  has normal speech, and behavior. Objectively there is no evidence of psychosis/mania or delusional thinking. Patient is  pre-occupied and focused on his dreams and nightmares and there does not seem to be any logic to what he is saying about them, he also appears to be paranoid when it comes to his dreams. He denies suicidal/self-harm/homicidal ideation and psychosis. Patient states he gets along with his mother and the mother of his children, he is there for all of them.     Past Psychiatric History: Psychosis   Risk to Self or Others: Is the patient at risk to self? No Has the patient been a risk to self in the past 6 months? No Has the patient been a risk to self within the distant past? No Is the patient a risk to others? No Has the patient been a risk to others in the past 6 months? No Has the patient been a risk to others within the distant past? No  [place] Scale:  [place] ED from 06/09/2022 in [facility] DEPT Most recent reading at 06/09/2022  5:46 PM OP Visit from 06/09/2022 in [facility] [place] Most recent reading at 06/09/2022  2:44 PM Admission (Discharged) from 05/07/2020 in [facility] Most recent reading at 05/07/2020 12:00 PM  C-SSRS RISK CATEGORY No Risk No Risk No Risk       AIMS:  , , ,  ,   ASAM:    Substance Abuse:     Past Medical History:  Past Medical History:  Diagnosis Date   Dental caries    Schizophrenia (  [place])    History reviewed. No pertinent surgical history. Family History:  Family History  Problem Relation Age of Onset   Diabetes Mother    Hypertension Mother     Social History:  Social History   Substance and Sexual Activity  Alcohol Use Yes   Alcohol/week: 4.0 standard drinks of alcohol   Types: 4 Cans of beer per week     Social History   Substance and Sexual Activity  Drug Use No    Social History   Socioeconomic History   Marital status: Single    Spouse name: Not on file   Number of children: Not on file   Years of education: Not on file    Highest education level: Not on file  Occupational History   Not on file  Tobacco Use   Smoking status: Every Day    Packs/day: 0.50    Types: Cigarettes   Smokeless tobacco: Never  Substance and Sexual Activity   Alcohol use: Yes    Alcohol/week: 4.0 standard drinks of alcohol    Types: 4 Cans of beer per week   Drug use: No   Sexual activity: Not on file  Other Topics Concern   Not on file  Social History Narrative   Not on file   Social Determinants of Health   Financial Resource Strain: Not on file  Food Insecurity: Not on file  Transportation Needs: Not on file  Physical Activity: Not on file  Stress: Not on file  Social Connections: Not on file      Allergies:   Allergies  Allergen Reactions   Naproxen Rash    Labs:  Results for orders placed or performed during the hospital encounter of 06/09/22 (from the past 48 hour(s))  Comprehensive metabolic panel     Status: Abnormal   Collection Time: 06/09/22  4:55 PM  Result Value Ref Range   Sodium 140 135 - 145 mmol/L   Potassium 3.2 (L) 3.5 - 5.1 mmol/L   Chloride 104 98 - 111 mmol/L   CO2 27 22 - 32 mmol/L   Glucose, Bld 111 (H) 70 - 99 mg/dL    Comment: Glucose reference range applies only to samples taken after fasting for at least 8 hours.   BUN 17 6 - 20 mg/dL   Creatinine, Ser 1.14 0.61 - 1.24 mg/dL   Calcium 9.3 8.9 - 10.3 mg/dL   Total Protein 8.0 6.5 - 8.1 g/dL   Albumin 4.5 3.5 - 5.0 g/dL   AST 23 15 - 41 U/L   ALT 18 0 - 44 U/L   Alkaline Phosphatase 58 38 - 126 U/L   Total Bilirubin 0.5 0.3 - 1.2 mg/dL   GFR, Estimated >60 >60 mL/min    Comment: (NOTE) Calculated using the CKD-EPI Creatinine Equation (2021)    Anion gap 9 5 - 15    Comment: Performed at [facility], [place] [address], [place], [place] [address]  CBC with Diff     Status: None   Collection Time: 06/09/22  4:55 PM  Result Value Ref Range   WBC 9.5 4.0 - 10.5 K/uL   RBC 5.23 4.22 - 5.81 MIL/uL   Hemoglobin  13.6 13.0 - 17.0 g/dL   HCT 42.0 39.0 - 52.0 %   MCV 80.3 80.0 - 100.0 fL   MCH 26.0 26.0 - 34.0 pg   MCHC 32.4 30.0 - 36.0 g/dL   RDW 13.3 11.5 - 15.5 %   Platelets 337 150 -  400 K/uL   nRBC 0.0 0.0 - 0.2 %   Neutrophils Relative % 64 %   Neutro Abs 6.1 1.7 - 7.7 K/uL   Lymphocytes Relative 29 %   Lymphs Abs 2.8 0.7 - 4.0 K/uL   Monocytes Relative 5 %   Monocytes Absolute 0.5 0.1 - 1.0 K/uL   Eosinophils Relative 1 %   Eosinophils Absolute 0.1 0.0 - 0.5 K/uL   Basophils Relative 1 %   Basophils Absolute 0.1 0.0 - 0.1 K/uL   Immature Granulocytes 0 %   Abs Immature Granulocytes 0.04 0.00 - 0.07 K/uL    Comment: Performed at [facility], [place] [address], [place], [place] [address]  Ethanol     Status: None   Collection Time: 06/09/22  4:56 PM  Result Value Ref Range   Alcohol, Ethyl (B) <10 <10 mg/dL    Comment: (NOTE) Lowest detectable limit for serum alcohol is 10 mg/dL.  For medical purposes only. Performed at [facility], [place] [address], [place], [place] [address]     No current facility-administered medications for this encounter.   Current Outpatient Medications  Medication Sig Dispense Refill   acetaminophen (TYLENOL) 500 MG tablet Take 500 mg by mouth every 6 (six) hours as needed.     amLODipine (NORVASC) 10 MG tablet Take 1 tablet (10 mg total) by mouth daily. 30 tablet 0   OLANZapine (ZYPREXA) 15 MG tablet Take 1 tablet (15 mg total) by mouth daily with supper. 30 tablet 0    Musculoskeletal: Strength & Muscle Tone: within normal limits Gait & Station: normal Patient leans: N/A   Psychiatric Specialty Exam: Presentation  General Appearance:  Appropriate for Environment  Eye Contact: Minimal  Speech: Normal Rate; Pressured  Speech Volume: Normal  Handedness: Right   Mood and Affect  Mood: Euthymic  Affect: Appropriate   Thought Process  Thought Processes: Disorganized  Descriptions of  Associations:Loose  Orientation:Full (Time, Place and Person)  Thought Content:Illogical; Paranoid Ideation  History of Schizophrenia/Schizoaffective disorder:Yes  Duration of Psychotic Symptoms:Less than six months  Hallucinations:Hallucinations: None  Ideas of Reference:Paranoia  Suicidal Thoughts:Suicidal Thoughts: No  Homicidal Thoughts:Homicidal Thoughts: No   Sensorium  Memory: Immediate Fair; Remote Fair  Judgment: Fair  Insight: Fair   Community education officer  Concentration: Good  Attention Span: Good  Recall: [place] of Knowledge: Fair  Language: Fair   Psychomotor Activity  Psychomotor Activity: Psychomotor Activity: Normal   Assets  Assets: Housing; Social Support    Sleep  Sleep: Sleep: Poor   Physical Exam: Physical Exam HENT:     Head: Normocephalic.  Cardiovascular:     Rate and Rhythm: Normal rate.  Pulmonary:     Effort: Pulmonary effort is normal.  Musculoskeletal:        General: Normal range of motion.  Skin:    General: Skin is warm.  Neurological:     Mental Status: He is alert.  Psychiatric:        Attention and Perception: Attention normal.        Mood and Affect: Mood normal.        Speech: Speech normal.        Behavior: Behavior is cooperative.        Thought Content: Thought content is paranoid.        Cognition and Memory: Cognition is impaired.    Review of Systems  Constitutional: Negative.   Respiratory: Negative.    Cardiovascular: Negative.   Genitourinary: Negative.  Skin: Negative.   Psychiatric/Behavioral:  Positive for substance abuse.    Blood pressure (!) 139/99, pulse 80, temperature 98 F (36.7 C), temperature source Oral, resp. rate 20, SpO2 100 %. There is no height or weight on file to calculate BMI.  Medical Decision Making: BROGHAN PANNONE is a 36 y.o. male patient transferred to here from [facility] due to recommendation of medical clearance. Patient denies SI/HI/AVH. Will start  patient on Zyprexa 5 mg @ hs for psychosis/paranoia. Recommend continuous assessment and re-evaluate in the morning. Requested this patient to be reviewed for [facility] when medically cleared.   Problem 1: Psychosis/paranoia: Zyprexa 5 mg started     Cohan Stipes MOTLEY-MANGRUM, PMHNP 06/09/2022 5:52 PM

## 2022-06-09 NOTE — H&P (Signed)
Behavioral Health Medical Screening Exam  Anthony Wiggins is an 37 y.o. 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 On the intake sheet he reported anxiety, irritability/anger, sweating, slurred speech; when asked about this on assessment patient unable to clearly state what is going on and keeps referencing "waking up in a dream". He is hypervigilant and paranoid. Reports using "ice" methamphetamine daily x2 years. His appearance is disheveled and poor. He is restless. He denies any thoughts of wanting to harm himself or anyone when asked. Provider discussed concerns over current presentation and explained recommendation of medical clearance where he is amenable for treatment. Provider explained process of transfer. AC notified.   Collateral: mother Reports girlfriend/child's mother contacted her to come get patient due to odd behaviors including increased paranoia, standing over his small children, mood lability with some visual hallucinations. Mom reports while patient was in the car he was "jumpy" and paranoid; threw his phone unprovoked. Constantly adjusting temperature and visibly sweating.   Total Time spent with patient: 30 minutes  Psychiatric Specialty Exam: Physical Exam Vitals and nursing note reviewed.    Review of Systems There were no vitals taken for this visit.There is no height or weight on file to calculate BMI. General Appearance: Disheveled Eye Contact:   inconsistent; fleeting Speech:  Pressured Volume:  Normal Mood:  Anxious Affect:  Non-Congruent and Inappropriate Thought Process:  Disorganized Orientation:  Other:  partial Thought Content:  Illogical, Hallucinations: Visual, and Paranoid Ideation Suicidal Thoughts:  No Homicidal Thoughts:   No Memory:  Immediate;   Poor Recent;   Poor Judgement:  Impaired Insight:  Lacking and Shallow Psychomotor Activity:  Increased and Restlessness Concentration: Concentration: Poor and Attention Span: Poor Recall:  Poor Fund of Knowledge:Fair Language: Fair Akathisia:  NA Handed:  Right AIMS (if indicated):    Assets:  Physical Health Resilience Social Support Sleep:     Musculoskeletal: Strength & Muscle Tone: within normal limits Gait & Station: normal Patient leans: Backward  There were no vitals taken for this visit.  [place] Scale:  Flowsheet Row [facility] ([facility]) [facility] from 05/06/2020 in [facility]  C-SSRS RISK CATEGORY No Risk No Risk No Risk       Recommendations: Based on my evaluation the patient appears to have an emergency medical condition for which I recommend the patient be transferred to the emergency department for further evaluation. Patient transferred to [facility] for medical clearance. Report called to Dr Fredderick Phenix, Nira Conn NP  Loletta Parish, NP 06/09/2022, 2:44 PM

## 2022-06-09 NOTE — ED Provider Triage Note (Cosign Needed)
Emergency Medicine Provider Triage Evaluation Note  Anthony Wiggins , a 36 y.o. male  was evaluated in triage.  Pt states he was sent here for medical clearance from behavioral health. He is not sure why.  Per chart review, psych provider performed medical screening and felt patient was increasingly paranoid and showing psychotic behaviors. Hx of cocaine and meth use. They sent him here for medical clearance but did not communicate their behavioral health disposition.   Review of Systems  Positive: paranoia Negative: Suicidal ideation, homicidal ideation, AVH  Physical Exam  There were no vitals taken for this visit. Gen:   Awake, no distress   Resp:  Normal effort  MSK:   Moves extremities without difficulty  Other:    Medical Decision Making  Medically screening exam initiated at 3:58 PM.  Appropriate orders placed.  Anthony Wiggins was informed that the remainder of the evaluation will be completed by another provider, this initial triage assessment does not replace that evaluation, and the importance of remaining in the ED until their evaluation is complete.  Medical clearance initiated. Pt is here voluntarily   Isaly Fasching T, PA-C 06/09/22 1558

## 2022-06-09 NOTE — ED Triage Notes (Signed)
Pt arrived via POV, requesting blood pressure check. States he was sent over by [facility]. Denies any SI/HI. Acting appropriately in triage.

## 2022-06-10 ENCOUNTER — Ambulatory Visit (HOSPITAL_COMMUNITY)
Admission: EM | Admit: 2022-06-10 | Discharge: 2022-06-10 | Disposition: A | Discharge status: Home / Self Care | Payer: No Payment, Other | Attending: Nurse Practitioner | Admitting: Nurse Practitioner

## 2022-06-10 ENCOUNTER — Other Ambulatory Visit: Payer: Self-pay

## 2022-06-10 ENCOUNTER — Emergency Department (HOSPITAL_COMMUNITY)
Admission: EM | Admit: 2022-06-10 | Discharge: 2022-06-10 | Disposition: A | Discharge status: Hospice | Payer: Medicaid Other | Attending: Emergency Medicine | Admitting: Emergency Medicine

## 2022-06-10 ENCOUNTER — Encounter (HOSPITAL_COMMUNITY): Payer: Self-pay

## 2022-06-10 DIAGNOSIS — D649 Anemia, unspecified: Secondary | ICD-10-CM | POA: Diagnosis not present

## 2022-06-10 DIAGNOSIS — Z1152 Encounter for screening for COVID-19: Secondary | ICD-10-CM | POA: Insufficient documentation

## 2022-06-10 DIAGNOSIS — F1994 Other psychoactive substance use, unspecified with psychoactive substance-induced mood disorder: Secondary | ICD-10-CM | POA: Insufficient documentation

## 2022-06-10 DIAGNOSIS — F19959 Other psychoactive substance use, unspecified with psychoactive substance-induced psychotic disorder, unspecified: Secondary | ICD-10-CM

## 2022-06-10 DIAGNOSIS — F22 Delusional disorders: Secondary | ICD-10-CM | POA: Diagnosis present

## 2022-06-10 DIAGNOSIS — F1910 Other psychoactive substance abuse, uncomplicated: Secondary | ICD-10-CM

## 2022-06-10 LAB — RESP PANEL BY RT-PCR (RSV, FLU A&B, COVID)  RVPGX2
Influenza A by PCR: NEGATIVE
Influenza B by PCR: NEGATIVE
Resp Syncytial Virus by PCR: NEGATIVE
SARS Coronavirus 2 by RT PCR: NEGATIVE

## 2022-06-10 IMAGING — CT CT HEAD W/O CM
3 series · 15 of 47 positions shown, 18 images · non-contrast
Comparison: 08/29/2005

CLINICAL DATA: Possible overdose.  Altered mental status.

EXAM:
CT HEAD WITHOUT CONTRAST
TECHNIQUE: Contiguous axial images were obtained from the base of the skull
through the vertex without intravenous contrast.

[Series 2: head wo · axial · 0.47mm/px · z∈[-190,-55]mm · 9 of 33 slices shown, 12 images]
[im 3/33  brain]
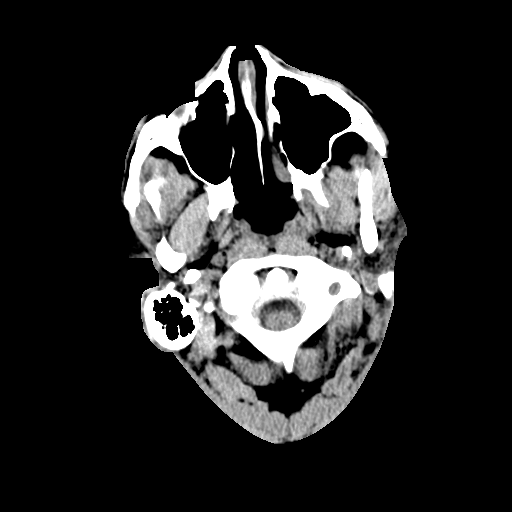
[im 3/33  bone]
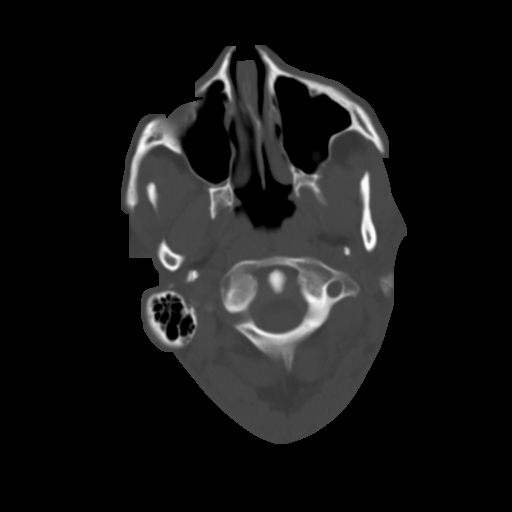
[im 6/33  brain]
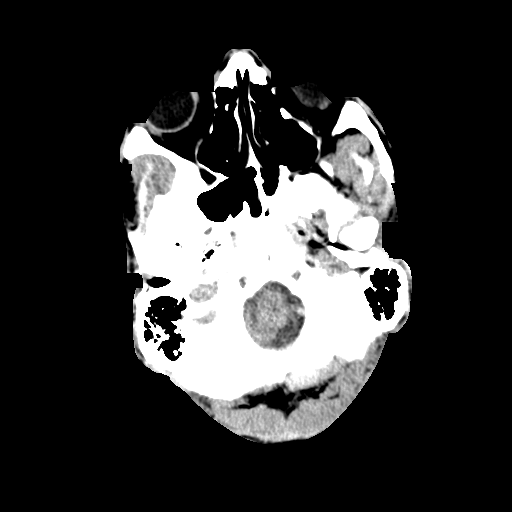
[im 9/33  brain]
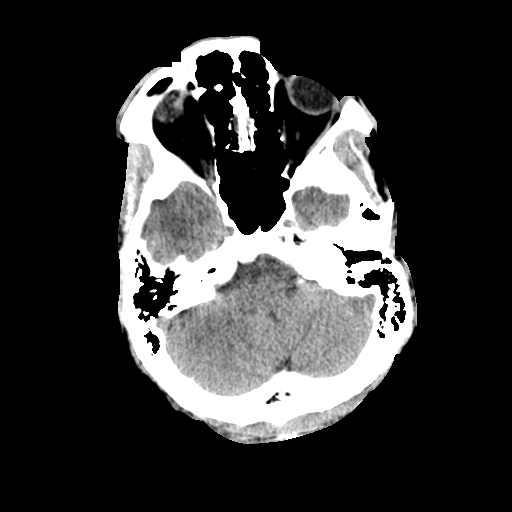
[im 13/33  brain]
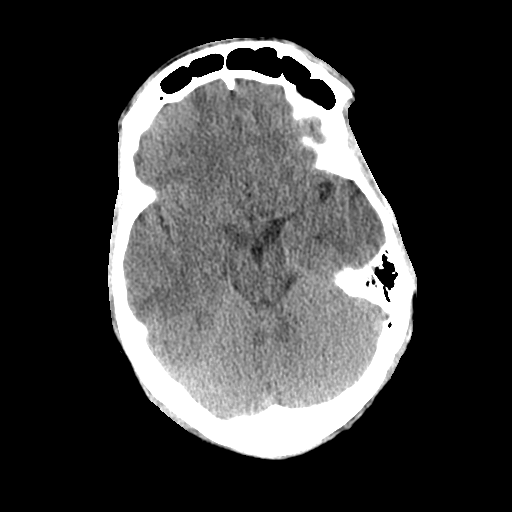
[im 17/33  brain]
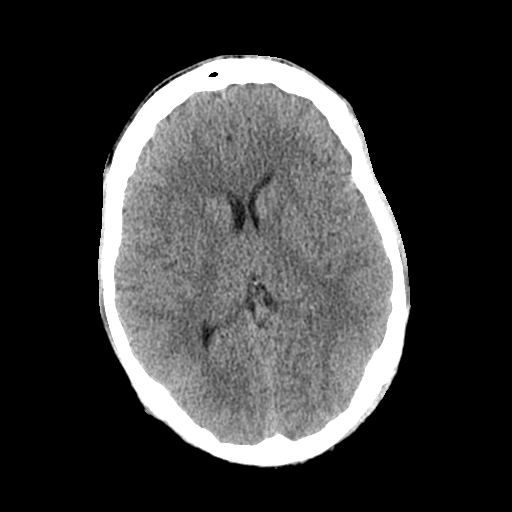
[im 17/33  bone]
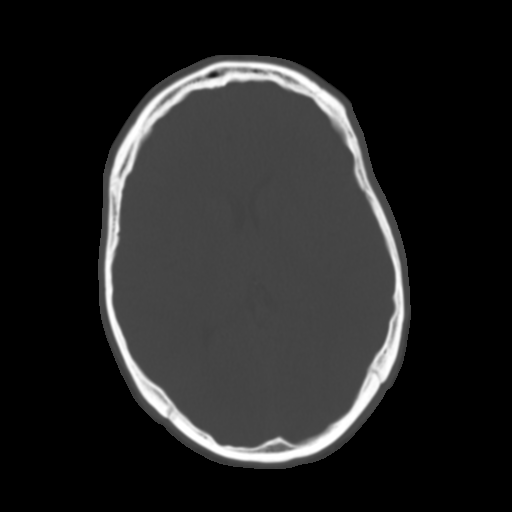
[im 20/33  brain]
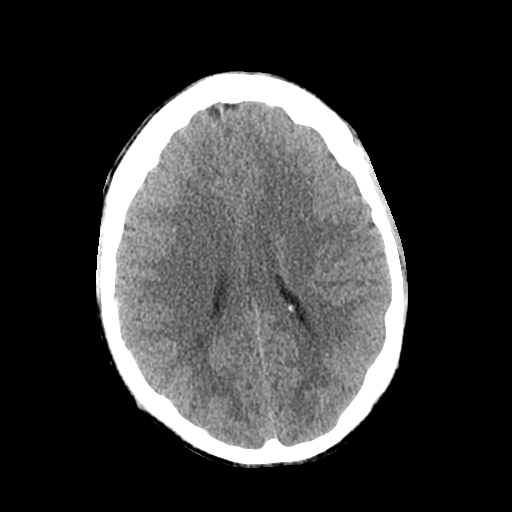
[im 24/33  brain]
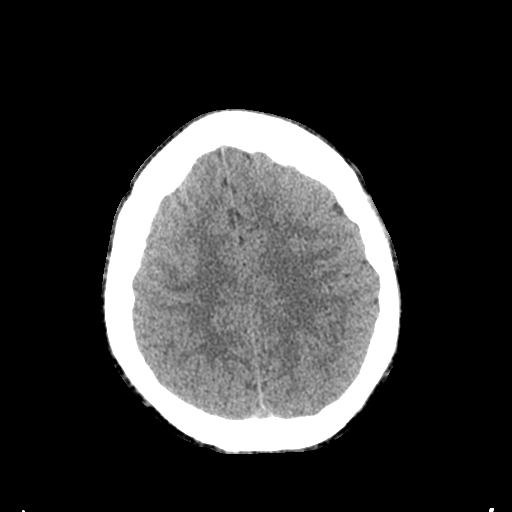
[im 27/33  brain]
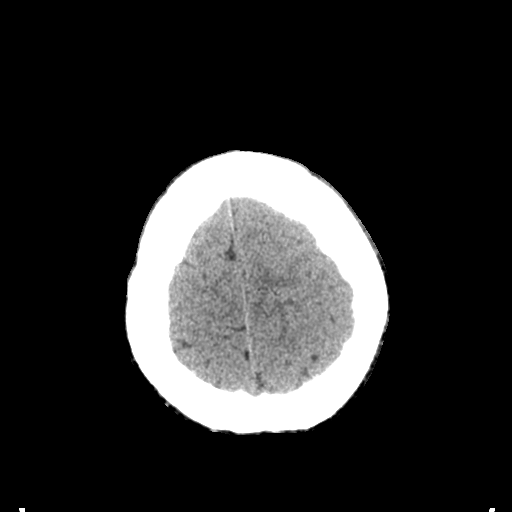
[im 30/33  brain]
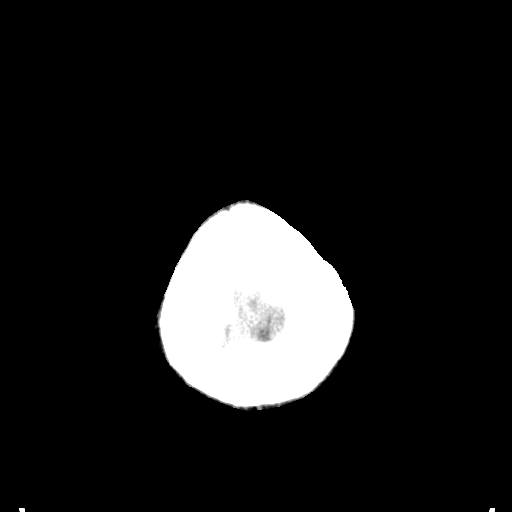
[im 30/33  bone]
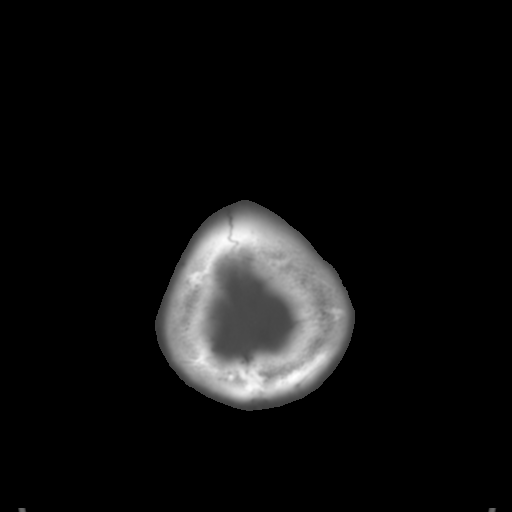

[Series 5: coronal soft tissue · coronal · 0.35mm/px · 3 of 84 slices shown]
[im 28/84  brain]
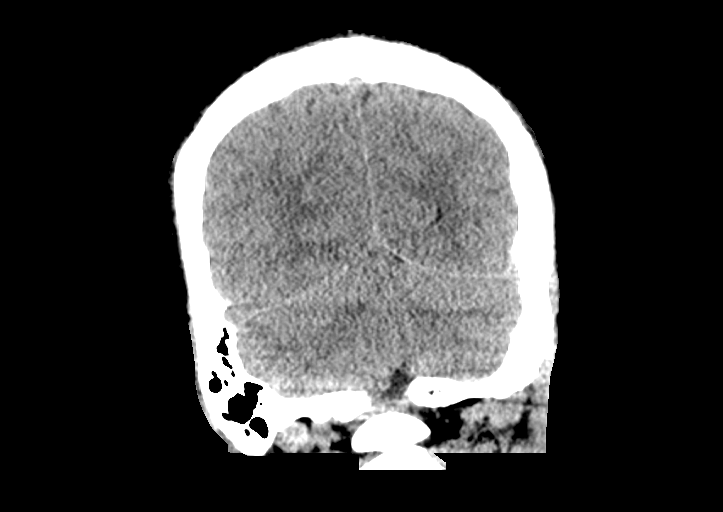
[im 37/84  brain]
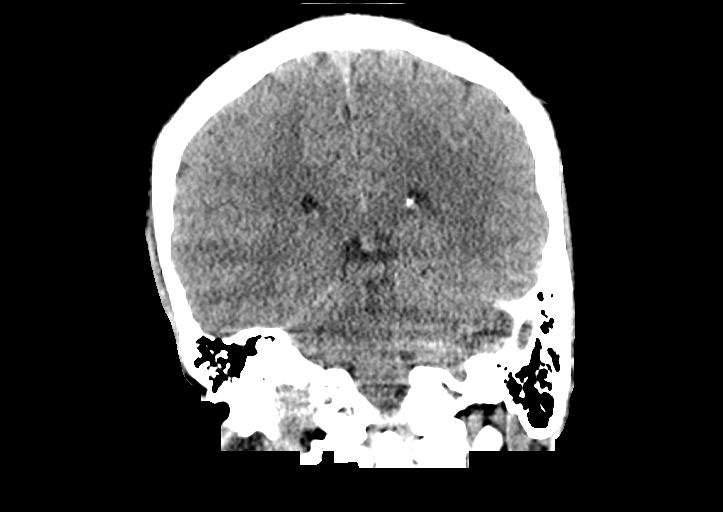
[im 47/84  brain]
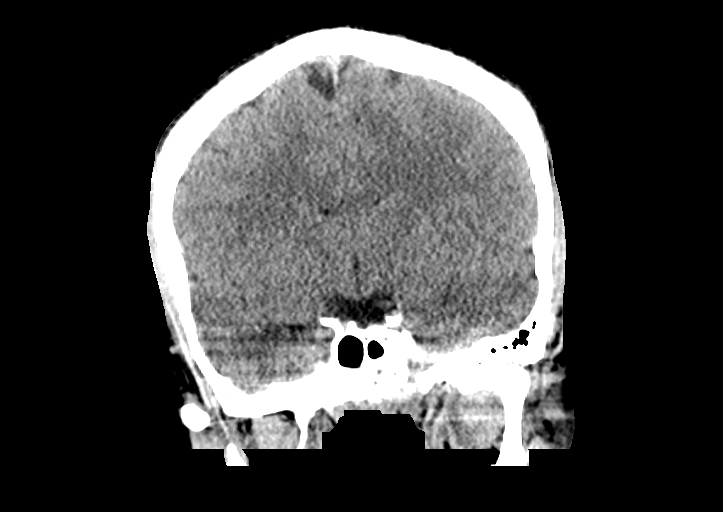

[Series 6: sagittal soft tissue · sagittal · 0.36mm/px · 3 of 57 slices shown]
[im 19/57  brain]
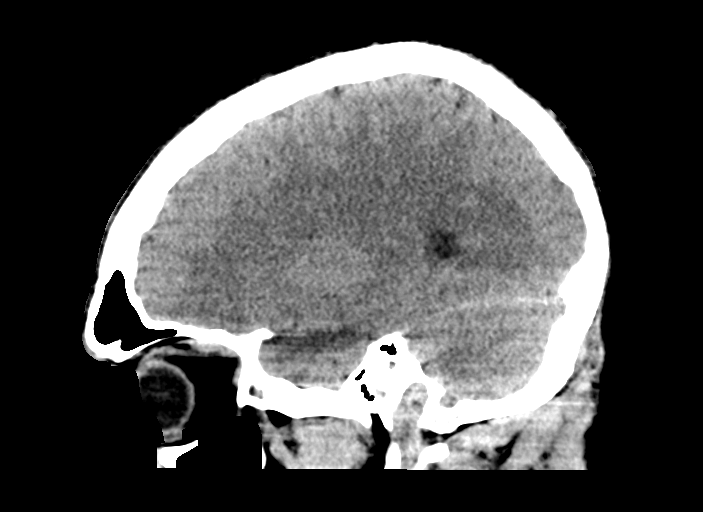
[im 29/57  brain]
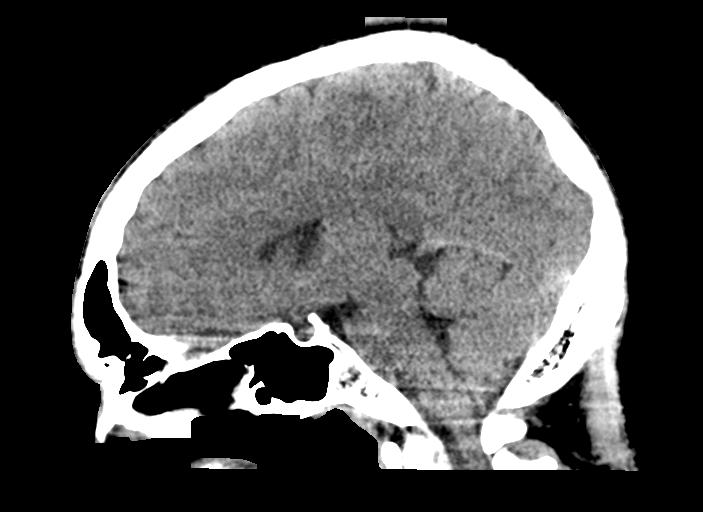
[im 38/57  brain]
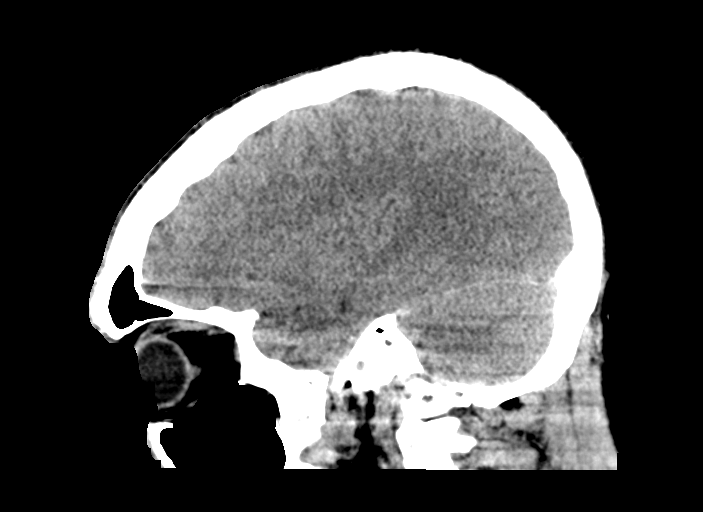

[15 of 47 positions shown; findings below may reference images not displayed]

FINDINGS: Brain: No evidence of acute infarction, hemorrhage, hydrocephalus,
extra-axial collection or mass lesion/mass effect.

Vascular: No hyperdense vessel or unexpected calcification.

Skull: Normal. Negative for fracture or focal lesion.

Sinuses/Orbits: Globes and orbits are unremarkable. Sinuses are
clear.

Other: None.
IMPRESSION: Normal unenhanced CT scan of the brain.

## 2022-06-10 MED ORDER — ALUM & MAG HYDROXIDE-SIMETH 200-200-20 MG/5ML PO SUSP: 30.0000 mL | ORAL | Status: DC | PRN

## 2022-06-10 MED ORDER — OLANZAPINE 5 MG PO TABS
5.0000 mg | ORAL_TABLET | Freq: Every day | ORAL | Status: DC
  Administered 2022-06-10: 5 mg via ORAL
  Filled 2022-06-10: qty 1

## 2022-06-10 MED ORDER — MAGNESIUM HYDROXIDE 400 MG/5ML PO SUSP: 30.0000 mL | Freq: Every day | ORAL | Status: DC | PRN

## 2022-06-10 MED ORDER — ACETAMINOPHEN 325 MG PO TABS: 650.0000 mg | ORAL_TABLET | Freq: Four times a day (QID) | ORAL | Status: DC | PRN

## 2022-06-10 MED ORDER — TRAZODONE HCL 50 MG PO TABS: 50.0000 mg | ORAL_TABLET | Freq: Every evening | ORAL | Status: DC | PRN

## 2022-06-10 NOTE — Discharge Instructions (Signed)
Substance Abuse Resources   [facility] - Admissions are currently completed Monday through Friday at 8am; both appointments and walk-ins are accepted.  Any individual that is a [place] resident may present for a substance abuse screening and assessment for admission.  A person may be referred by numerous sources or self-refer.   Potential clients will be screened for medical necessity and appropriateness for the program.  Clients must meet criteria for high-intensity residential treatment services.  If clinically appropriate, a client will continue with the comprehensive clinical assessment and intake process, as well as enrollment in the [facility].  Address: [address] [place], [place] [address] Admin Hours: Mon-Fri 8AM to 5PM Center Hours: 24/7 Phone: [phone] Fax: [phone]  [facility] Facility Based Crisis:  These are 3 locations for services for week and weekend: Please call before arrival:    [facility] ([facility]  Address: [address] [place], [place] [address] Phone: [phone]  [facility] ([facility] Address: [address], [place], [place] [address] Phone#: [phone]  [facility] ([facility] Address: [address], [place], [place] [address] Phone#: [phone]   [facility] (offers outpatient therapy and intensive outpatient substance abuse therapy).  [address], [place], [place] [address] Phone: [phone]   Phone: [phone]  The [facility] SA Intensive Outpatient Program (SAIOP) means structured individual and group addiction activities and services that are provided at an outpatient program designed to assist adult and adolescent consumers to begin recovery and learn skills for recovery maintenance. The [facility] SAIOP program is offered at least 3 hours a  day, 3 days a week. SAIOP services shall include a structured program consisting of, but not limited to, the following services: Individual counseling and support; Group counseling and support; Family counseling, training or support; Biochemical assays to identify recent drug use (e.g., urine drug screens); Strategies for relapse prevention to include community and social support systems in treatment; Life skills; Crisis contingency planning; Disease Management; and Treatment support activities that have been adapted or specifically designed for persons with physical disabilities, or persons with co-occurring disorders of mental illness and substance abuse/dependence or mental retardation/developmental disability and substance abuse/dependence.  Phone: [phone]   [facility] ([facility])  Address: [address], [place], [place] [address] Phone: [phone]   [facility] Address: [address], [place], [place] [address] Phone: [phone]  - a combination of group and individual sessions to meet the participants needs. This allows participants to engage in treatment and remain involved in their home and work life. - Transitional housing places program participants in a supportive living environment while they complete a treatment program and work to secure independent housing. - The Substance Abuse Intensive Outpatient Treatment Program at [facility] consists of structured group sessions and individual sessions that are designed to teach participants early recovery and relapse prevention skills. -[facility] works with the [facility] [employer] to provide a housing and treatment program for homeless veterans.   [facility]   Address: [address] [place], [place] [address] Phone#: [phone]   : Referrals to RTSA facilities can be made by [facility] and [facility].  Referrals are also  accepted from physicians, private providers, hospital emergency rooms, family members, or any person who has knowledge of someone in the need of our services.    The [place] [facility] will also offer the following outpatient services: (Monday through Friday 8am-5pm)   Partial Hospitalization Program (PHP) Substance Abuse Intensive Outpatient Program (SA-IOP) Group Therapy Medication Management Peer Living Room We also provide (24/7):  Assessments: Our mental health clinician and providers will conduct a focused mental health evaluation, assessing for immediate safety concerns and further mental health needs. Referral: Our team will provide resources and help connect to community based mental health treatment, when indicated, including psychotherapy, psychiatry, and other specialized behavioral health or substance use disorder services (for those not already in treatment). Transitional Care: Our team providers in person bridging and/or telephonic follow-up during the patient's transition to outpatient services.     [facility] 24-Hour Call Center: [phone] Behavioral Health Crisis Line: [phone]  
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
## 2022-06-10 NOTE — ED Provider Notes (Signed)
[facility] Continuous [facility] Admission H&P  Date: 06/10/22 Patient Name: Anthony GuBrandon L Henrie MRN: [id number] Chief Complaint: IVC Chief Complaint  Patient presents with   IVC      Diagnoses:  Final diagnoses:  Psychoactive substance-induced psychosis (HCC)    HPI: Bishop DublinBrandon Schwinn is a 36 y/o male with a history of psychoactive substance induced psychosis, methamphetamine abuse who initially presented voluntarily on 06/09/22 to [facility] and was evaluated by NP Leevy-Johnson and sent out for medical clearance to [facility].   NP Jacelyn GripLeevy-Johnson-Xander L Beckerman is an 36 y.o. 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 On the intake sheet he reported anxiety, irritability/anger, sweating, slurred speech; when asked about this on assessment patient unable to clearly state what is going on and keeps referencing "waking up in a dream". He is hypervigilant and paranoid. Reports using "ice" methamphetamine daily x2 years.   Patient was [facility] at [facility] by NP Motley-Mangum and [facility] once medically cleared.  Anthony GuBrandon L Goddard, 36 y.o., male patient seen face to face by this provider, chart reviewed on 06/09/22. On evaluation Anthony Wiggins when asked why he was brought to the hospital he stated "I hadn't got enough sleep, when I wake up never had nightmares, since I was a kid and now I am having dreams and nightmares." Patient stated he used "ice" methamphetamines 48 hrs ago but he has been using it for 2 yrs. Patient reports "ice" is the only substance he uses. Patient states he does not work, he lives between his mother and baby-mother's house, where he has 3 little boys. Patient said sometimes his dreams are so real, but he is still asleep and he will  wake up and go to his kids room and check on them. Patient states he is stressed sometimes about not having a job, and helping his children. Patient wants to be seen on an outpatient basis. During evaluation Anthony GuBrandon L Shamoon is sitting in the hospital chair in no acute distress.  He is alert, oriented x 4, calm, cooperative, he speaks a lot about dreams and nightmares, and how he did not use to dream, provider asked him if the dreams scare him he said sometimes when they are nightmares. His mood is euthymic with congruent affect. He  has normal speech, and behavior. Objectively there is no evidence of psychosis/mania or delusional thinking. Patient is pre-occupied and focused on his dreams and nightmares and there does not seem to be any logic to what he is saying about them, he also appears to be paranoid when it comes to his dreams. He denies suicidal/self-harm/homicidal ideation and psychosis. Patient states he gets along with his mother and the mother of his children, he is there for all of them.    Patient reports that he signed himself out of [facility] and was being driven home by his mother when patient attempted to jump out the car.   Patient's mother Darryl NestleDeborah Parker had patient IVCed. Responded when to call behavioral health was admitted to the hospital but signed himself out.  Mother states respondent jumped out of her car, took her cell phone and threw it out and then she had to go up the street after him to get him back in the car.  Respondent thinks someone is  after him.  Respondent has been making strange noises and hollering.  Respondent is not sleeping.  Responded stated that he used a drug ice 48 hours.  Respondent is a danger to himself and others.  Patient endorses smoking methamphetamine daily, about $10 worth at a time with most recent use 2 days ago.  Patient denies any other illicit substance use, or alcohol use.  Patient denies currently being followed by any outpatient provider or  currently taking any medications. On examination patient is alert oriented x 3, calm and cooperative, does not appear to be responding to any external stimuli, does not appear hypervigilant and denies any suicidal or homicidal ideations.  Patient will be admitted to [facility] continuous observation for crisis management, safety stabilization.  PHQ 2-9:  Flowsheet Row ED from 06/10/2022 in [facility]  Thoughts that you would be better off dead, or of hurting yourself in some way Not at all  PHQ-9 Total Score 4       Flowsheet Row ED from 06/10/2022 in [facility] Most recent reading at 06/10/2022  4:11 AM ED from 06/10/2022 in [facility] Most recent reading at 06/10/2022 12:38 AM ED from 06/09/2022 in [facility] Most recent reading at 06/09/2022  5:46 PM  C-SSRS RISK CATEGORY No Risk No Risk No Risk        Total Time spent with patient: 30 minutes  Musculoskeletal  Strength & Muscle Tone: within normal limits Gait & Station: normal Patient leans: N/A  Psychiatric Specialty Exam  Presentation General Appearance:  Casual  Eye Contact: Minimal  Speech: Normal Rate  Speech Volume: Normal  Handedness: Right   Mood and Affect  Mood: Euthymic  Affect: Blunt   Thought Process  Thought Processes: Disorganized  Descriptions of Associations:Loose  Orientation:Full (Time, Place and Person)  Thought Content:Illogical; Paranoid Ideation  Diagnosis of Schizophrenia or Schizoaffective disorder in past: Yes  Duration of Psychotic Symptoms: Less than six months  Hallucinations:Hallucinations: None  Ideas of Reference:Paranoia  Suicidal Thoughts:Suicidal Thoughts: No  Homicidal Thoughts:Homicidal Thoughts: No   Sensorium  Memory: Immediate Fair; Recent Fair; Remote Fair  Judgment: Fair  Insight: Fair   Art therapist   Concentration: Good  Attention Span: Fair  Recall: Fair  Fund of Knowledge: Fair  Language: Good   Psychomotor Activity  Psychomotor Activity: Psychomotor Activity: Normal   Assets  Assets: Housing; Social Support; Physical Health; Resilience   Sleep  Sleep: Sleep: Poor Number of Hours of Sleep: 4   Nutritional Assessment (For OBS and FBC admissions only) Has the patient had a weight loss or gain of 10 pounds or more in the last 3 months?: No Has the patient had a decrease in food intake/or appetite?: No Does the patient have dental problems?: No Does the patient have eating habits or behaviors that may be indicators of an eating disorder including binging or inducing vomiting?: No Has the patient recently lost weight without trying?: 0 Has the patient been eating poorly because of a decreased appetite?: 0 Malnutrition Screening Tool Score: 0    Physical Exam HENT:     Head: Normocephalic and atraumatic.     Nose: Nose normal.  Eyes:     Pupils: Pupils are equal, round, and reactive to light.  Cardiovascular:     Rate and Rhythm: Normal rate.  Pulmonary:     Effort: Pulmonary effort is normal.  Abdominal:     General: Abdomen is flat.  Musculoskeletal:        General: Normal range of motion.     Cervical back: Normal range of motion.  Skin:    General: Skin is warm.  Neurological:     Mental Status: He is alert and oriented to person, place, and time.  Psychiatric:        Attention and Perception: Attention normal.        Mood and Affect: Mood normal.        Speech: Speech normal.        Behavior: Behavior is cooperative.        Thought Content: Thought content is paranoid. Thought content does not include homicidal or suicidal ideation. Thought content does not include homicidal or suicidal plan.        Cognition and Memory: Cognition normal.        Judgment: Judgment is impulsive.    Review of Systems  Constitutional: Negative.   HENT:  Negative.    Eyes: Negative.   Respiratory: Negative.    Cardiovascular: Negative.   Gastrointestinal: Negative.   Genitourinary: Negative.   Musculoskeletal: Negative.   Skin: Negative.   Neurological: Negative.   Endo/Heme/Allergies: Negative.   Psychiatric/Behavioral:  Positive for substance abuse.     Blood pressure (!) 148/97, pulse 85, temperature 98.1 F (36.7 C), temperature source Oral, resp. rate 18, SpO2 100 %. There is no height or weight on file to calculate BMI.  Past Psychiatric History; [facility] 2021  Is the patient at risk to self? No  Has the patient been a risk to self in the past 6 months? No .    Has the patient been a risk to self within the distant past? No   Is the patient a risk to others? No   Has the patient been a risk to others in the past 6 months? No   Has the patient been a risk to others within the distant past? No   Past Medical History:  Past Medical History:  Diagnosis Date   Dental caries    Schizophrenia (HCC)    No past surgical history on file.  Family History:  Family History  Problem Relation Age of Onset   Diabetes Mother    Hypertension Mother     Social History:  Social History   Socioeconomic History   Marital status: Single    Spouse name: Not on file   Number of children: Not on file   Years of education: Not on file   Highest education level: Not on file  Occupational History   Not on file  Tobacco Use   Smoking status: Every Day    Packs/day: 0.50    Types: Cigarettes   Smokeless tobacco: Never  Substance and Sexual Activity   Alcohol use: Yes    Alcohol/week: 4.0 standard drinks of alcohol    Types: 4 Cans of beer per week   Drug use: No   Sexual activity: Not on file  Other Topics Concern   Not on file  Social History Narrative   Not on file   Social Determinants of Health   Financial Resource Strain: Not on file  Food Insecurity: Not on file  Transportation Needs: Not on file  Physical Activity:  Not on file  Stress: Not on file  Social Connections: Not on file  Intimate Partner Violence: Not on file    SDOH:  SDOH Screenings   Alcohol Screen: Low Risk  (05/07/2020)  Depression (PHQ2-9): Low Risk  (06/10/2022)  Tobacco Use: High Risk (06/10/2022)    Last Labs:  Admission on 06/10/2022, Discharged on 06/10/2022  Component Date Value Ref Range Status   SARS Coronavirus 2 by RT PCR 06/10/2022 NEGATIVE  NEGATIVE Final   Comment: (NOTE) SARS-CoV-2 target nucleic acids are NOT DETECTED.  The SARS-CoV-2 RNA is generally detectable in upper respiratory specimens during the acute phase of infection. The lowest concentration of SARS-CoV-2 viral copies this assay can detect is 138 copies/mL. A negative result does not preclude SARS-Cov-2 infection and should not be used as the sole basis for treatment or other patient management decisions. A negative result may occur with  improper specimen collection/handling, submission of specimen other than nasopharyngeal swab, presence of viral mutation(s) within the areas targeted by this assay, and inadequate number of viral copies(<138 copies/mL). A negative result must be combined with clinical observations, patient history, and epidemiological information. The expected result is Negative.  Fact Sheet for Patients:  BloggerCourse.com  Fact Sheet for Healthcare Providers:  SeriousBroker.it  This test is no                          t yet approved or cleared by the [place] FDA and  has been authorized for detection and/or diagnosis of SARS-CoV-2 by FDA under an Emergency Use Authorization (EUA). This EUA will remain  in effect (meaning this test can be used) for the duration of the COVID-19 declaration under Section 564(b)(1) of the Act, 21 U.S.C.section 360bbb-3(b)(1), unless the authorization is terminated  or revoked sooner.       Influenza A by PCR 06/10/2022 NEGATIVE   NEGATIVE Final   Influenza B by PCR 06/10/2022 NEGATIVE  NEGATIVE Final   Comment: (NOTE) The Xpert Xpress SARS-CoV-2/FLU/RSV plus assay is intended as an aid in the diagnosis of influenza from Nasopharyngeal swab specimens and should not be used as a sole basis for treatment. Nasal washings and aspirates are unacceptable for Xpert Xpress SARS-CoV-2/FLU/RSV testing.  Fact Sheet for Patients: BloggerCourse.com  Fact Sheet for Healthcare Providers: SeriousBroker.it  This test is not yet approved or cleared by the [place] FDA and has been authorized for detection and/or diagnosis of SARS-CoV-2 by FDA under an Emergency Use Authorization (EUA). This EUA will remain in effect (meaning this test can be used) for the duration of the COVID-19 declaration under Section 564(b)(1) of the Act, 21 U.S.C. section 360bbb-3(b)(1), unless the authorization is terminated or revoked.     Resp Syncytial Virus by PCR 06/10/2022 NEGATIVE  NEGATIVE Final   Comment: (NOTE) Fact Sheet for Patients: BloggerCourse.com  Fact Sheet for Healthcare Providers: SeriousBroker.it  This test is not yet approved or cleared by the [place] FDA and has been authorized for detection and/or diagnosis of SARS-CoV-2 by FDA under an Emergency Use Authorization (EUA). This EUA will remain in effect (meaning this test can be used) for the duration of the COVID-19 declaration under Section 564(b)(1) of the Act, 21 U.S.C. section 360bbb-3(b)(1), unless the authorization is terminated or revoked.  Performed at [facility], [address] [place], [place] [address]   Admission on 06/09/2022, Discharged on 06/09/2022  Component Date Value Ref Range Status   Sodium 06/09/2022 140  135 - 145 mmol/L Final   Potassium 06/09/2022 3.2 (L)  3.5 - 5.1 mmol/L Final   Chloride 06/09/2022 104  98 - 111  mmol/L Final   CO2 06/09/2022 27  22 - 32 mmol/L Final   Glucose, Bld  06/09/2022 111 (H)  70 - 99 mg/dL Final   Glucose reference range applies only to samples taken after fasting for at least 8 hours.   BUN 06/09/2022 17  6 - 20 mg/dL Final   Creatinine, Ser 06/09/2022 1.14  0.61 - 1.24 mg/dL Final   Calcium 12/16/7626 9.3  8.9 - 10.3 mg/dL Final   Total Protein 31/51/7616 8.0  6.5 - 8.1 g/dL Final   Albumin 07/37/1062 4.5  3.5 - 5.0 g/dL Final   AST 69/48/5462 23  15 - 41 U/L Final   ALT 06/09/2022 18  0 - 44 U/L Final   Alkaline Phosphatase 06/09/2022 58  38 - 126 U/L Final   Total Bilirubin 06/09/2022 0.5  0.3 - 1.2 mg/dL Final   GFR, Estimated 06/09/2022 >60  >60 mL/min Final   Comment: (NOTE) Calculated using the CKD-EPI Creatinine Equation (2021)    Anion gap 06/09/2022 9  5 - 15 Final   Performed at [facility], [address], [place], [place] [address]   Alcohol, Ethyl (B) 06/09/2022 <10  <10 mg/dL Final   Comment: (NOTE) Lowest detectable limit for serum alcohol is 10 mg/dL.  For medical purposes only. Performed at [facility], [address], [place], [place] [address]    WBC 06/09/2022 9.5  4.0 - 10.5 K/uL Final   RBC 06/09/2022 5.23  4.22 - 5.81 MIL/uL Final   Hemoglobin 06/09/2022 13.6  13.0 - 17.0 g/dL Final   HCT 82/99/3716 42.0  39.0 - 52.0 % Final   MCV 06/09/2022 80.3  80.0 - 100.0 fL Final   MCH 06/09/2022 26.0  26.0 - 34.0 pg Final   MCHC 06/09/2022 32.4  30.0 - 36.0 g/dL Final   RDW 96/78/9381 13.3  11.5 - 15.5 % Final   Platelets 06/09/2022 337  150 - 400 K/uL Final   nRBC 06/09/2022 0.0  0.0 - 0.2 % Final   Neutrophils Relative % 06/09/2022 64  % Final   Neutro Abs 06/09/2022 6.1  1.7 - 7.7 K/uL Final   Lymphocytes Relative 06/09/2022 29  % Final   Lymphs Abs 06/09/2022 2.8  0.7 - 4.0 K/uL Final   Monocytes Relative 06/09/2022 5  % Final   Monocytes Absolute 06/09/2022 0.5  0.1 - 1.0 K/uL Final   Eosinophils  Relative 06/09/2022 1  % Final   Eosinophils Absolute 06/09/2022 0.1  0.0 - 0.5 K/uL Final   Basophils Relative 06/09/2022 1  % Final   Basophils Absolute 06/09/2022 0.1  0.0 - 0.1 K/uL Final   Immature Granulocytes 06/09/2022 0  % Final   Abs Immature Granulocytes 06/09/2022 0.04  0.00 - 0.07 K/uL Final   Performed at [facility], [address], [place], [place] [address]    Allergies: Naproxen  PTA Medications: None   Medical Decision Making   Anish Vana is a 36 y/o male with a history of psychoactive substance induced psychosis, methamphetamine abuse who initially presented voluntarily on 06/09/22 to [facility]. On the intake sheet he reported anxiety, irritability/anger, sweating, slurred speech and was sent to [facility]. Patient himself signed out and was subsequently [facility] by his mother for attempting to get out of moving car, and paranoia.   Recommendations  Based on my evaluation the patient does not appear to have an emergency medical condition.  Patient will be noted to [facility] continuous observation for crisis management, stabilization and safety.  Jasper Riling, NP 06/10/22  6:24 AM

## 2022-06-10 NOTE — ED Notes (Signed)
Patient was discharged to home by provider. Patient was given an AVS with community resources. Patient was transported via a cab driver to home.

## 2022-06-10 NOTE — BH Assessment (Signed)
Alona Bene, PMHNP gave disposition: Requested this patient to be reviewed for [facility] when medically cleared.    Roselyn Bering, NP states she reviewed Pt and accepted to [facility] for continuous assessment. Notified Loleta Dicker, PA-C and Elinor Dodge, RN of acceptance.    Pamalee Leyden, [facility], [facility] Triage Specialist [phone]

## 2022-06-10 NOTE — ED Provider Notes (Addendum)
FBC/OBS ASAP Discharge Summary  Date and Time: 06/10/2022 10:21 AM  Name: Anthony Wiggins  MRN:  [id number]   Discharge Diagnoses:  Final diagnoses:  Psychoactive substance-induced psychosis [facility])    Subjective: Anthony Wiggins is a 36 year old African-American male that was seen and evaluated face-to-face by this provider.  He presents under involuntary commitment due to substance-induced disorder.  Was reported that patient was irritable paranoid and hallucinating.  Patient was recommended for overnight observation.  He is awake alert and oriented.  Denying suicidal or homicidal ideations.  Denies auditory visual hallucinations.  Discussed continuing medications for mood stabilization however patient declined.  Patient was offered additional outpatient resources for substance abuse treatment.  Patient declined.  He provided verbal authorization to follow-up with his mother.  Mother reports concerns with his methamphetamine use and was hopeful that he would stay in a long-term treatment program.  She reports "I will have to have him involuntarily committed again."  Education provided with patient making decisions as this is related to substance abuse/ use.  She was reluctant to plan.  Support, encouragement and reassurance was provided.  During evaluation Anthony Wiggins is resting  in no acute distress. he is alert/oriented x 3; calm/cooperative; and mood congruent with affect. he is speaking in a clear tone at moderate volume, and normal pace; with good eye contact.  His thought process is coherent and relevant; There is no indication that he is currently responding to internal/external stimuli or experiencing delusional thought content; slight paranoia/apprehension noted during this assessment however is requesting to discharge.  Advised patient that mother would like to speak to him prior to discharge.  He reports " she did not answer the phone."    Anthony Wiggins has denied suicidal/self-harm/homicidal  ideation, psychosis, and paranoia.   Patient has remained calm throughout assessment and has answered questions appropriately.     At this time Anthony Wiggins is educated and verbalizes understanding of mental health resources and other crisis services in the community. he is instructed to call 911 and present to the nearest emergency room should he experience any suicidal/homicidal ideation, auditory/visual/hallucinations, or detrimental worsening of mental health condition. he was a also advised by Clinical research associate that he could call the toll-free phone on back of  insurance card to assist with identifying in network counselors and agencies or number on back of Medicaid card to speak with care coordinator.    Stay Summary:  Total Time spent with patient: 15 minutes  Past Psychiatric History:  Past Medical History:  Past Medical History:  Diagnosis Date   Dental caries    Schizophrenia (HCC)    No past surgical history on file. Family History:  Family History  Problem Relation Age of Onset   Diabetes Mother    Hypertension Mother    Family Psychiatric History:  Social History:  Social History   Substance and Sexual Activity  Alcohol Use Yes   Alcohol/week: 4.0 standard drinks of alcohol   Types: 4 Cans of beer per week     Social History   Substance and Sexual Activity  Drug Use No    Social History   Socioeconomic History   Marital status: Single    Spouse name: Not on file   Number of children: Not on file   Years of education: Not on file   Highest education level: Not on file  Occupational History   Not on file  Tobacco Use   Smoking status: Every Day    Packs/day: 0.50  Types: Cigarettes   Smokeless tobacco: Never  Substance and Sexual Activity   Alcohol use: Yes    Alcohol/week: 4.0 standard drinks of alcohol    Types: 4 Cans of beer per week   Drug use: No   Sexual activity: Not on file  Other Topics Concern   Not on file  Social History Narrative   Not on  file   Social Determinants of Health   Financial Resource Strain: Not on file  Food Insecurity: Not on file  Transportation Needs: Not on file  Physical Activity: Not on file  Stress: Not on file  Social Connections: Not on file   SDOH:  SDOH Screenings   Alcohol Screen: Low Risk  (05/07/2020)  Depression (PHQ2-9): Low Risk  (06/10/2022)  Tobacco Use: High Risk (06/10/2022)    Tobacco Cessation:  N/A, patient does not currently use tobacco products  Current Medications:  Current Facility-Administered Medications  Medication Dose Route Frequency Provider Last Rate Last Admin   acetaminophen (TYLENOL) tablet 650 mg  650 mg Oral Q6H PRN Bobbitt, Shalon E, NP       alum & mag hydroxide-simeth (MAALOX/MYLANTA) 200-200-20 MG/5ML suspension 30 mL  30 mL Oral Q4H PRN Bobbitt, Shalon E, NP       magnesium hydroxide (MILK OF MAGNESIA) suspension 30 mL  30 mL Oral Daily PRN Bobbitt, Shalon E, NP       OLANZapine (ZYPREXA) tablet 5 mg  5 mg Oral QHS Bobbitt, Shalon E, NP   5 mg at 06/10/22 0434   traZODone (DESYREL) tablet 50 mg  50 mg Oral QHS PRN Bobbitt, Shalon E, NP       Current Outpatient Medications  Medication Sig Dispense Refill   acetaminophen (TYLENOL) 500 MG tablet Take 500 mg by mouth every 6 (six) hours as needed.     amLODipine (NORVASC) 10 MG tablet Take 1 tablet (10 mg total) by mouth daily. 30 tablet 0   OLANZapine (ZYPREXA) 15 MG tablet Take 1 tablet (15 mg total) by mouth daily with supper. 30 tablet 0    PTA Medications: (Not in a hospital admission)      06/10/2022    4:03 AM  Depression screen PHQ 2/9  Decreased Interest 1  Down, Depressed, Hopeless 1  PHQ - 2 Score 2  Altered sleeping 1  Tired, decreased energy 1  Change in appetite 0  Feeling bad or failure about yourself  0  Trouble concentrating 0  Moving slowly or fidgety/restless 0  Suicidal thoughts 0  PHQ-9 Score 4  Difficult doing work/chores Somewhat difficult    Flowsheet Row ED from  06/10/2022 in [facility] Most recent reading at 06/10/2022  4:11 AM ED from 06/10/2022 in [facility] Most recent reading at 06/10/2022 12:38 AM ED from 06/09/2022 in [facility] Most recent reading at 06/09/2022  5:46 PM  C-SSRS RISK CATEGORY No Risk No Risk No Risk       Musculoskeletal  Strength & Muscle Tone: within normal limits Gait & Station: normal Patient leans: N/A  Psychiatric Specialty Exam  Presentation  General Appearance:  Casual  Eye Contact: Minimal  Speech: Normal Rate  Speech Volume: Normal  Handedness: Right   Mood and Affect  Mood: Euthymic  Affect: Blunt   Thought Process  Thought Processes: Disorganized  Descriptions of Associations:Loose  Orientation:Full (Time, Place and Person)  Thought Content:Illogical; Paranoid Ideation  Diagnosis of Schizophrenia or Schizoaffective disorder in past: Yes  Duration of Psychotic Symptoms: Less than six months   Hallucinations:Hallucinations: None  Ideas of Reference:Paranoia  Suicidal Thoughts:Suicidal Thoughts: No  Homicidal Thoughts:Homicidal Thoughts: No   Sensorium  Memory: Immediate Fair; Recent Fair; Remote Fair  Judgment: Fair  Insight: Fair   Community education officer  Concentration: Good  Attention Span: Fair  Recall: [place] of Knowledge: Fair  Language: Good   Psychomotor Activity  Psychomotor Activity: Psychomotor Activity: Normal   Assets  Assets: Housing; Social Support; Physical Health; Resilience   Sleep  Sleep: Sleep: Poor Number of Hours of Sleep: 4   Nutritional Assessment (For OBS and FBC admissions only) Has the patient had a weight loss or gain of 10 pounds or more in the last 3 months?: No Has the patient had a decrease in food intake/or appetite?: No Does the patient have dental problems?: No Does the patient have eating habits or  behaviors that may be indicators of an eating disorder including binging or inducing vomiting?: No Has the patient recently lost weight without trying?: 0 Has the patient been eating poorly because of a decreased appetite?: 0 Malnutrition Screening Tool Score: 0    Physical Exam  Physical Exam Vitals and nursing note reviewed.  Cardiovascular:     Rate and Rhythm: Normal rate and regular rhythm.  Skin:    General: Skin is warm.  Neurological:     Mental Status: He is alert and oriented to person, place, and time.  Psychiatric:        Mood and Affect: Mood normal.        Behavior: Behavior normal.        Thought Content: Thought content normal.    Review of Systems  Gastrointestinal: Negative.   Psychiatric/Behavioral:  Positive for substance abuse. Negative for depression and suicidal ideas. The patient is nervous/anxious.   All other systems reviewed and are negative.  Blood pressure (!) 148/97, pulse 85, temperature 98.1 F (36.7 C), temperature source Oral, resp. rate 18, SpO2 100 %. There is no height or weight on file to calculate BMI.  Demographic Factors:  Male and Unemployed  Loss Factors: Financial problems/change in socioeconomic status  Historical Factors: Family history of mental illness or substance abuse  Risk Reduction Factors:   Sense of responsibility to family  Continued Clinical Symptoms:  Alcohol/Substance Abuse/Dependencies  Cognitive Features That Contribute To Risk:  Closed-mindedness    Suicide Risk:  Minimal: No identifiable suicidal ideation.  Patients presenting with no risk factors but with morbid ruminations; may be classified as minimal risk based on the severity of the depressive symptoms  Plan Of Care/Follow-up recommendations:  Activity:  as tolerated Diet:  heart healthy   Ivc was rescinded   Disposition: Take all of you medications as prescribed by your mental healthcare provider.  Report any adverse effects and reactions  from your medications to your outpatient provider promptly.  Do not engage in alcohol and or illegal drug use while on prescription medicines. Keep all scheduled appointments. This is to ensure that you are getting refills on time and to avoid any interruption in your medication.  If you are unable to keep an appointment call to reschedule.  Be sure to follow up with resources and follow ups given. In the event of worsening symptoms call the crisis hotline, 911, and or go to the nearest emergency department for appropriate evaluation and treatment of symptoms. Follow-up with your primary care provider for your medical issues, concerns and or health care needs.  Derrill Center, NP 06/10/2022, 10:21 AM

## 2022-06-10 NOTE — Progress Notes (Signed)
CSW provided the following resources for the patient to utilize:   Substance Abuse Resources   [facility] - Admissions are currently completed Monday through Friday at 8am; both appointments and walk-ins are accepted.  Any individual that is a [facility] resident may present for a substance abuse screening and assessment for admission.  A person may be referred by numerous sources or self-refer.   Potential clients will be screened for medical necessity and appropriateness for the program.  Clients must meet criteria for high-intensity residential treatment services.  If clinically appropriate, a client will continue with the comprehensive clinical assessment and intake process, as well as enrollment in the [facility].  Address: [address] [place], [place] [address] Admin Hours: Mon-Fri 8AM to [facility] Hours: 24/7 Phone: [phone] Fax: [phone]  [facility] (Det[facility]) Facility Based Crisis:  These are 3 locations for services for week and weekend: Please call before arrival:    [facility] Recovery Facility Based Crisis [facility])  Address: [address] [place], [place] [address] Phone: [phone]  [facility] Recovery Facility Based Crisis [facility]) Address: [address] [place], [place] [address] Phone#: [phone]  [facility] Recovery Facility Based Crisis [facility]) Address: [address] [place], [place] [address] Phone#: [phone]   [facility] (offers outpatient therapy and intensive outpatient substance abuse therapy).  [address] [place], [place] [address] Phone: [phone]   Phone: [phone]  [facility] Intensive Outpatient Program [facility]) means structured individual and group addiction activities and services that are provided at an outpatient program designed to assist adult and adolescent consumers to begin recovery and learn skills for recovery  maintenance. The [facility] SAIOP program is offered at least 3 hours a day, 3 days a week. SAIOP services shall include a structured program consisting of, but not limited to, the following services: Individual counseling and support; Group counseling and support; Family counseling, training or support; Biochemical assays to identify recent drug use (e.g., urine drug screens); Strategies for relapse prevention to include community and social support systems in treatment; Life skills; Crisis contingency planning; Disease Management; and Treatment support activities that have been adapted or specifically designed for persons with physical disabilities, or persons with co-occurring disorders of mental illness and substance abuse/dependence or mental retardation/developmental disability and substance abuse/dependence.  Phone: [phone]   [facility])  Address: [address] [place], [place], [place] [address] Phone: [phone]   [facility] Address: [address], [place], [place] [address] Phone: [phone]  - a combination of group and individual sessions to meet the participants needs. This allows participants to engage in treatment and remain involved in their home and work life. - Transitional housing places program participants in a supportive living environment while they complete a treatment program and work to secure independent housing. - The Substance Abuse Intensive Outpatient Treatment Program at [employer] consists of structured group sessions and individual sessions that are designed to teach participants early recovery and relapse prevention skills. -[facility] works with the [employer] to provide a housing and treatment program for homeless veterans.   [facility] [employer] [facility], [employer].   Address: [address] [place], [place] [address] Phone#: [phone]   : Referrals to RTSA facilities can be made  by [employer] [facility].  Referrals are also accepted from physicians, private providers, hospital emergency rooms, family members, or any person  who has knowledge of someone in the need of our services.  The [facility] will also offer the following outpatient services: (Monday through Friday 8am-5pm)   Partial Hospitalization Program (PHP) Substance Abuse Intensive Outpatient Program (SA-IOP) Group Therapy Medication Management Peer Living Room We also provide (24/7):  Assessments: Our mental health clinician and providers will conduct a focused mental health evaluation, assessing for immediate safety concerns and further mental health needs. Referral: Our team will provide resources and help connect to community based mental health treatment, when indicated, including psychotherapy, psychiatry, and other specialized behavioral health or substance use disorder services (for those not already in treatment). Transitional Care: Our team providers in person bridging and/or telephonic follow-up during the patient's transition to outpatient services.   The [facility] 24-Hour Call Center: [phone] Behavioral Health Crisis Line: [phone]   Crissie Reese, MSW, Lenice Pressman Phone: [phone] Disposition/TOC

## 2022-06-10 NOTE — ED Triage Notes (Signed)
Patient is being IVC, mother states that the patient is Paranoid and having delusions of someone being after him. She states that he did "Ice" 48 hrs ago and since than he has been making strange noises and is a danger to himself and others.

## 2022-06-10 NOTE — ED Notes (Signed)
Pt under IVC by mother, presents for paranoid delusional behavior.  Pt thinks someone is after him. Pt jumped out of car and took mothers cell phone and threw it away.  Pt admitted he used drug called ICE 48 hours ago.  Denies SI, HI.  Monitoring for safety.

## 2022-06-10 NOTE — ED Notes (Signed)
Anthony Bering, NP has accepted Pt to [facility] for continuous assessment. Number for RN report is [phone]. Report was called. Awaiting COVID results.

## 2022-06-10 NOTE — ED Notes (Signed)
Pt is awake at this time.  He has invol muscle movements and twitching under his covers consistent with Meth use also complaining of feeling cold. Pt given another blanket and informed that the provider would be in shortly to speak with him.  Pt rolled back over and is attempting to go back to sleep.

## 2022-06-11 ENCOUNTER — Ambulatory Visit (HOSPITAL_COMMUNITY)
Admission: EM | Admit: 2022-06-11 | Discharge: 2022-06-11 | Disposition: A | Discharge status: Home / Self Care | Payer: No Payment, Other

## 2022-06-11 NOTE — ED Triage Notes (Signed)
Pt presents to [facility] accompanied by his mother seeking outpatient therapy and medication management. Pt reports being diagnosed with ADHD, and Bipolar disorder. Pt states he has not been prescribed medication since 2008. Pt reports use of "ICE", last use 5 days ago. Pt reports nightmares at times. Pt denies SI/HI and AVH.

## 2022-06-12 ENCOUNTER — Emergency Department (HOSPITAL_COMMUNITY)
Admission: EM | Admit: 2022-06-12 | Discharge: 2022-06-13 | Disposition: A | Discharge status: Home / Self Care | Payer: Medicaid Other | Attending: Emergency Medicine | Admitting: Emergency Medicine

## 2022-06-12 ENCOUNTER — Other Ambulatory Visit: Payer: Self-pay

## 2022-06-12 ENCOUNTER — Ambulatory Visit (HOSPITAL_COMMUNITY)
Admission: RE | Admit: 2022-06-12 | Discharge: 2022-06-12 | Disposition: A | Discharge status: Home / Self Care | Payer: Federal, State, Local not specified - Other | Attending: Psychiatry | Admitting: Psychiatry

## 2022-06-12 ENCOUNTER — Encounter (HOSPITAL_COMMUNITY): Payer: Self-pay | Admitting: Emergency Medicine

## 2022-06-12 DIAGNOSIS — F19182 Other psychoactive substance abuse with psychoactive substance-induced sleep disorder: Secondary | ICD-10-CM | POA: Diagnosis not present

## 2022-06-12 DIAGNOSIS — E876 Hypokalemia: Secondary | ICD-10-CM | POA: Diagnosis not present

## 2022-06-12 DIAGNOSIS — R569 Unspecified convulsions: Secondary | ICD-10-CM | POA: Diagnosis present

## 2022-06-12 DIAGNOSIS — F19982 Other psychoactive substance use, unspecified with psychoactive substance-induced sleep disorder: Secondary | ICD-10-CM

## 2022-06-12 LAB — BASIC METABOLIC PANEL
Anion gap: 11 (ref 5–15)
BUN: 16 mg/dL (ref 6–20)
CO2: 25 mmol/L (ref 22–32)
Calcium: 9 mg/dL (ref 8.9–10.3)
Chloride: 103 mmol/L (ref 98–111)
Creatinine, Ser: 1.31 mg/dL — ABNORMAL HIGH (ref 0.61–1.24)
GFR, Estimated: >60 mL/min (ref >60)
Glucose, Bld: 106 mg/dL — ABNORMAL HIGH (ref 70–99)
Potassium: 3 mmol/L — ABNORMAL LOW (ref 3.5–5.1)
Sodium: 139 mmol/L (ref 135–145)

## 2022-06-12 LAB — CBC WITH DIFFERENTIAL/PLATELET
Abs Immature Granulocytes: 0.03 10*3/uL (ref 0.00–0.07)
Basophils Absolute: 0.1 10*3/uL (ref 0.0–0.1)
Basophils Relative: 1 %
Eosinophils Absolute: 0.1 10*3/uL (ref 0.0–0.5)
Eosinophils Relative: 1 %
HCT: 39 % (ref 39.0–52.0)
Hemoglobin: 12.8 g/dL — ABNORMAL LOW (ref 13.0–17.0)
Immature Granulocytes: 0 %
Lymphocytes Relative: 27 %
Lymphs Abs: 2.7 10*3/uL (ref 0.7–4.0)
MCH: 26.3 pg (ref 26.0–34.0)
MCHC: 32.8 g/dL (ref 30.0–36.0)
MCV: 80.2 fL (ref 80.0–100.0)
Monocytes Absolute: 0.4 10*3/uL (ref 0.1–1.0)
Monocytes Relative: 4 %
Neutro Abs: 6.5 10*3/uL (ref 1.7–7.7)
Neutrophils Relative %: 67 %
Platelets: 335 10*3/uL (ref 150–400)
RBC: 4.86 MIL/uL (ref 4.22–5.81)
RDW: 13.5 % (ref 11.5–15.5)
WBC: 9.7 10*3/uL (ref 4.0–10.5)
nRBC: 0 % (ref 0.0–0.2)

## 2022-06-12 LAB — RAPID URINE DRUG SCREEN, HOSP PERFORMED
Amphetamines: POSITIVE — AB
Barbiturates: NOT DETECTED
Benzodiazepines: NOT DETECTED
Cocaine: NOT DETECTED
Opiates: NOT DETECTED
Tetrahydrocannabinol: NOT DETECTED

## 2022-06-12 MED ORDER — HYDROXYZINE HCL 25 MG PO TABS: 25.0000 mg | ORAL_TABLET | Freq: Four times a day (QID) | ORAL | 0 refills | Status: DC

## 2022-06-12 MED ORDER — LORAZEPAM 1 MG PO TABS
2.0000 mg | ORAL_TABLET | Freq: Once | ORAL | Status: AC
  Administered 2022-06-13: 2 mg via ORAL
  Filled 2022-06-12: qty 2

## 2022-06-12 MED ORDER — POTASSIUM CHLORIDE CRYS ER 20 MEQ PO TBCR
60.0000 meq | EXTENDED_RELEASE_TABLET | Freq: Once | ORAL | Status: AC
  Administered 2022-06-13: 60 meq via ORAL
  Filled 2022-06-12: qty 3

## 2022-06-12 NOTE — H&P (Signed)
Behavioral Health Medical Screening Exam  Anthony Wiggins is a 36 y.o. male with history of psychoactive substance induced psychosis, methamphetamine abuse, tobacco use disorder, moderate, dependence who walked in voluntarily to [facility] accompanied with friends with a complaint of having nightmares.  Patient was seen face to face by this provider and chart reviewed.  Per chart review patient was seen 06/10/22 at [facility] on an involuntary commitment for substance-induced disorder. During assessment at [facility], mother expressed concern about patient using methamphetamine and hoping that he can get a long-term treatment. After the evaluation, patient does not meet criteria, he contracted for safety, he was discharged and given outpatient treatment resources.  Patient presented to [facility] today 06/11/22 accompanied by friends. Patient says he has nightmares every night. Patient stated " Whenever I get up from sleep I have nightmares.  Patient stated " Can I have Zyprexa and medication for my blood pressure?". Provider explained to the patient that he cannot have medication at time. Patient was asked if he takes Zyprexa or blood pressure medication at home, patient answered "No".  Patient endorses smoking methamphetamine daily, about $10 worth at a time with most recent use 6 days ago. Patient denies any other illicit substance use, or alcohol use. Patient denies currently being followed by any outpatient provider or currently taking any medications.  Patient describes his sleep pattern as poor, and says appetite is good. Patient reports living with his friend and children.  Patient denies having gun at home or access to firearms weapon.  On evaluation, patient was sitting quietly, alert and oriented x3, speech is clear and coherent. Patient's eye contact is good, mood is euthymic, affect is blunt.  Patient's thought process is goal directed and thought content is within normal limits. Patient denies SI HI, denies AVH, or  paranoia. There is no indication that the patient is responding to internal stimuli and no delusion noted.    Support, encouragement and reassurance provided about ongoing stressors and patient provided with opportunity for questions.    Patient does not meet inpatient psychiatry admission criteria and there is no evidence of imminent danger to self or others.    Discussed methods to reduce the risk of self-injury or suicide attempts: Frequent conversations regarding unsafe thoughts. Remove all significant sharps. Remove all firearms. Remove all medications, including over-the-counter meds. Consider lockbox for medications and having a responsible person dispense medications until patient has strengthened coping skills. Room checks for sharps or other harmful objects. Secure all chemical substances that can be ingested or inhaled.   Please refrain from using alcohol or illicit substances, as they can affect your mood and can cause depression, anxiety or other concerning symptoms.  Alcohol can increase the chance that a person will make reckless decisions, like attempting suicide, and can increase the lethality of a drug overdose.      Discussed crisis plan, calling 911, or going to the ED if condition changes or worsens.  Patient verbalized her understanding.      Total Time spent with patient: 20 minutes  Psychiatric Specialty Exam:  Presentation  General Appearance:  Appropriate for Environment  Eye Contact: Good  Speech: Normal Rate  Speech Volume: Normal  Handedness: Right   Mood and Affect  Mood: Euthymic  Affect: Blunt   Thought Process  Thought Processes: Coherent  Descriptions of Associations:Intact  Orientation:Full (Time, Place and Person)  Thought Content:WDL  History of Schizophrenia/Schizoaffective disorder:Yes  Duration of Psychotic Symptoms:Greater than six months  Hallucinations:Hallucinations: None  Ideas of  Reference:None  Suicidal  Thoughts:Suicidal Thoughts: No  Homicidal Thoughts:Homicidal Thoughts: No   Sensorium  Memory: Immediate Good  Judgment: Fair  Insight: Good   Executive Functions  Concentration: Good  Attention Span: Good  Recall: Good  Fund of Knowledge: Fair  Language: Good   Psychomotor Activity  Psychomotor Activity: Psychomotor Activity: Normal   Assets  Assets: Desire for Improvement; Resilience; Social Support; Communication Skills   Sleep  Sleep: Sleep: Fair    Physical Exam: Physical Exam Vitals reviewed.  Constitutional:      Appearance: Normal appearance.  Eyes:     General:        Right eye: No discharge.  Cardiovascular:     Rate and Rhythm: Normal rate.  Pulmonary:     Effort: No respiratory distress.     Breath sounds: No wheezing.  Neurological:     Mental Status: He is alert.     Motor: No weakness.  Psychiatric:        Attention and Perception: He does not perceive auditory or visual hallucinations.        Mood and Affect: Affect is not blunt.        Behavior: Behavior normal.        Thought Content: Thought content is not paranoid or delusional. Thought content does not include homicidal or suicidal ideation. Thought content does not include homicidal or suicidal plan.   Review of Systems  Constitutional:  Negative for chills and fever.  Eyes:  Negative for discharge and redness.  Respiratory:  Negative for shortness of breath and wheezing.   Cardiovascular:  Negative for chest pain.  Gastrointestinal:  Negative for abdominal pain, diarrhea, nausea and vomiting.  Neurological:  Negative for dizziness, seizures, weakness and headaches.  Psychiatric/Behavioral:  Negative for hallucinations and suicidal ideas. The patient is not nervous/anxious.    Blood pressure (!) 158/114, pulse 86, temperature 97.9 F (36.6 C), temperature source Oral, resp. rate 20, SpO2 100 %. There is no height or weight on file to calculate  BMI.  Musculoskeletal: Strength & Muscle Tone: within normal limits Gait & Station: normal Patient leans: N/A  [place] Scale:  Flowsheet Row ED from 06/11/2022 in [facility] Most recent reading at 06/11/2022  2:27 PM ED from 06/10/2022 in [facility] Most recent reading at 06/10/2022  4:11 AM ED from 06/10/2022 in [facility] Most recent reading at 06/10/2022 12:38 AM  C-SSRS RISK CATEGORY No Risk No Risk No Risk       Recommendations:  Based on my evaluation the patient does not appear to have an emergency medical condition.  Recommends outpatient psychiatric services for medication management and therapy. Patient was given outpatient psychiatric resources and patient verbalized understanding. Patient left the facility with friends and no issue at the time he left the facility.  Adin Hector, NP 06/12/2022, 1:36 AM

## 2022-06-12 NOTE — ED Triage Notes (Signed)
Pt. Stated, I fall asleep cause I can't go to sleep at night and I guess I had a seizure not sure.

## 2022-06-12 NOTE — ED Provider Notes (Signed)
[facility] EMERGENCY DEPARTMENT Provider Note   CSN: [id number] Arrival date & time: 06/12/22  2440     History  Chief Complaint  Patient presents with   convulsions   Insomnia   Seizures    Anthony Wiggins is a 36 y.o. male with a past medical history of substance use disorder and psychosis presenting today due to insomnia and odd behavior.  Mother reports that he has not been sleeping for days.  He was seen at behavioral health urgent care yesterday and ultimately cleared for outpatient follow-up.   Mother says that around 8 yesterday morning she noted some full body shaking and his girlfriend questions whether or not he has had more seizures over the past couple of days.  Again, no history of seizure disorder. She is most concerned he is not sleeping as well.  He says that he is unable to get to sleep and is up all night every night.  Does endorse some meth use.   Insomnia  Seizures      Home Medications Prior to Admission medications   Medication Sig Start Date End Date Taking? Authorizing Provider  amLODipine (NORVASC) 10 MG tablet Take 1 tablet (10 mg total) by mouth daily. 05/10/20   Antonieta Pert, MD  OLANZapine (ZYPREXA) 15 MG tablet Take 1 tablet (15 mg total) by mouth daily with supper. 05/09/20   Antonieta Pert, MD      Allergies    Naproxen    Review of Systems   Review of Systems  Neurological:  Positive for seizures.  Psychiatric/Behavioral:  The patient has insomnia.     Physical Exam Updated Vital Signs BP (!) 127/94 (BP Location: Right Arm)   Pulse 100   Temp 98.5 F (36.9 C) (Oral)   Resp 18   SpO2 100%  Physical Exam Vitals and nursing note reviewed.  Constitutional:      Appearance: Normal appearance.  HENT:     Head: Normocephalic and atraumatic.  Eyes:     General: Scleral icterus present.     Extraocular Movements: Extraocular movements intact.     Conjunctiva/sclera: Conjunctivae normal.  Pulmonary:      Effort: Pulmonary effort is normal. No respiratory distress.  Skin:    Findings: No rash.  Neurological:     Mental Status: He is alert.  Psychiatric:        Mood and Affect: Mood normal.     ED Results / Procedures / Treatments   Labs (all labs ordered are listed, but only abnormal results are displayed) Labs Reviewed  CBC WITH DIFFERENTIAL/PLATELET - Abnormal; Notable for the following components:      Result Value   Hemoglobin 12.8 (*)    All other components within normal limits  BASIC METABOLIC PANEL - Abnormal; Notable for the following components:   Potassium 3.0 (*)    Glucose, Bld 106 (*)    Creatinine, Ser 1.31 (*)    All other components within normal limits  RAPID URINE DRUG SCREEN, HOSP PERFORMED - Abnormal; Notable for the following components:   Amphetamines POSITIVE (*)    All other components within normal limits    EKG EKG Interpretation  Date/Time:  Tuesday June 12 2022 08:48:02 EST Ventricular Rate:  77 PR Interval:    QRS Duration: 86 QT Interval:  376 QTC Calculation: 425 R Axis:   96 Text Interpretation: Normal sinus rhythm T wave abnormality, consider inferior ischemia Abnormal ECG When compared with ECG of 10-Jun-2022 04:12,  PREVIOUS ECG IS PRESENT Confirmed by Kristine Royal [id number] on 06/12/2022 11:02:37 PM  Radiology No results found.  Procedures Procedures  Medications Ordered in ED Medications  LORazepam (ATIVAN) tablet 2 mg (has no administration in time range)  potassium chloride SA (KLOR-CON M) CR tablet 60 mEq (has no administration in time range)    ED Course/ Medical Decision Making/ A&P                           Medical Decision Making Risk Prescription drug management.   36 year old male presenting today due to insomnia and may be a seizure.  No history of seizures.  They were seen at behavioral health urgent care and ultimately cleared psychiatrically yesterday.  Mother says that around 8 yesterday morning she  noted some full body shaking and his girlfriend questions whether or not he has had more seizures over the past couple of days.  Again, no history of seizure disorder.  In the department today patient does not appear altered.  He has been here for 14 hours without any seizure-like activity.  During our conversations his mother and he seemed most concerned about his insomnia.  We discussed that meth use often leads to difficulty sleeping.  He has not tried to take any medications for this.  We discussed options for detox outpatient.  His mother was disappointed that we did not have any options from the emergency department but ultimately she is agreeable to patient being treated with Ativan for his insomnia, potassium for his hypokalemia and hydroxyzine outpatient.  Patient understands that ultimately the hydroxyzine will not be of much help unless he stops using meth.  Detox resources were placed on his discharge papers and we discussed that they may follow-up with behavioral health urgent care as needed however they will likely get similar resources from that office.  Alert, oriented and stable for discharge   Final Clinical Impression(s) / ED Diagnoses Final diagnoses:  Drug-induced insomnia (HCC)  Hypokalemia    Rx / DC Orders ED Discharge Orders     None      Results and diagnoses were explained to the patient. Return precautions discussed in full. Patient had no additional questions and expressed complete understanding.   This chart was dictated using voice recognition software.  Despite best efforts to proofread,  errors can occur which can change the documentation meaning.

## 2022-06-12 NOTE — Discharge Instructions (Addendum)
You came to the emergency department with insomnia and the concern for seizures.  Your potassium was slightly low so you were given repletion of this.  You were given a medication called Ativan for insomnia and agitation as well.  The medication at your pharmacy is for sleep however ultimately it will be of little use if you continue to use any drugs.  I have attached several resources for detox to your discharge papers as well.  Please start to give them a call tomorrow.  Also the behavioral health urgent care is 24 hours and may be able to assist further if needed.  I have attached multiple primary care offices to these discharge papers for you to discuss your blood pressure with.  It is not high enough to start any medication from the emergency department.  It was a pleasure to meet you and we hope you feel better!

## 2022-06-12 NOTE — ED Triage Notes (Signed)
EMS stated, he is coming form BH with convulsions. No hx of seizures.

## 2022-06-12 NOTE — ED Provider Triage Note (Signed)
Emergency Medicine Provider Triage Evaluation Note  OSTEN JANEK , a 36 y.o. male  was evaluated in triage.  Pt complains of seizure-like activity.  Mother at bedside providing history.  Mom states that patient had seizure-like activity around 8 AM this morning.  She notes his head and mouth were shaking.  No arm/leg movement.  Per mother, girlfriend states that patient has had a few seizures this week.  No history of seizure disorder.  Patient seen by [facility] early this morning and was discharged with outpatient resources.  Patient denies chronic alcohol use.  Denies urinary incontinence.  No mouth trauma.  Patient had an unremarkable CT head on 12/4. Patient denies any pain  Review of Systems  Positive: seizures Negative: fever  Physical Exam  BP (!) 115/99 (BP Location: Right Arm)   Pulse 84   Temp 98.7 F (37.1 C)   Resp 16   SpO2 100%  Gen:   Awake, no distress   Resp:  Normal effort  MSK:   Moves extremities without difficulty  Other:  Patient does not answer every question. No pronator drift. No facial droop  Medical Decision Making  Medically screening exam initiated at 9:15 AM.  Appropriate orders placed.  MALCOM SELMER was informed that the remainder of the evaluation will be completed by another provider, this initial triage assessment does not replace that evaluation, and the importance of remaining in the ED until their evaluation is complete.  Routine labs UDS Unremarkable CT head on 12/4   Jesusita Oka 06/12/22 [id number]

## 2022-06-12 NOTE — ED Provider Notes (Incomplete)
  [facility] EMERGENCY DEPARTMENT Provider Note   CSN: [id number] Arrival date & time: 06/12/22  8099     History  Chief Complaint  Patient presents with   convulsions   Insomnia   Seizures    JUVENTINO PAVONE is a 36 y.o. male with polysubstance    Insomnia  Seizures      Home Medications Prior to Admission medications   Medication Sig Start Date End Date Taking? Authorizing Provider  amLODipine (NORVASC) 10 MG tablet Take 1 tablet (10 mg total) by mouth daily. 05/10/20   Antonieta Pert, MD  OLANZapine (ZYPREXA) 15 MG tablet Take 1 tablet (15 mg total) by mouth daily with supper. 05/09/20   Antonieta Pert, MD      Allergies    Naproxen    Review of Systems   Review of Systems  Neurological:  Positive for seizures.  Psychiatric/Behavioral:  The patient has insomnia.     Physical Exam Updated Vital Signs BP (!) 127/94 (BP Location: Right Arm)   Pulse 100   Temp 98.5 F (36.9 C) (Oral)   Resp 18   SpO2 100%  Physical Exam  ED Results / Procedures / Treatments   Labs (all labs ordered are listed, but only abnormal results are displayed) Labs Reviewed  CBC WITH DIFFERENTIAL/PLATELET - Abnormal; Notable for the following components:      Result Value   Hemoglobin 12.8 (*)    All other components within normal limits  BASIC METABOLIC PANEL - Abnormal; Notable for the following components:   Potassium 3.0 (*)    Glucose, Bld 106 (*)    Creatinine, Ser 1.31 (*)    All other components within normal limits  RAPID URINE DRUG SCREEN, HOSP PERFORMED - Abnormal; Notable for the following components:   Amphetamines POSITIVE (*)    All other components within normal limits    EKG EKG Interpretation  Date/Time:  Tuesday June 12 2022 08:48:02 EST Ventricular Rate:  77 PR Interval:    QRS Duration: 86 QT Interval:  376 QTC Calculation: 425 R Axis:   96 Text Interpretation: Normal sinus rhythm T wave abnormality, consider  inferior ischemia Abnormal ECG When compared with ECG of 10-Jun-2022 04:12, PREVIOUS ECG IS PRESENT Confirmed by Kristine Royal [phone]) on 06/12/2022 11:02:37 PM  Radiology No results found.  Procedures Procedures  {Document cardiac monitor, telemetry assessment procedure when appropriate:1}  Medications Ordered in ED Medications - No data to display  ED Course/ Medical Decision Making/ A&P                           Medical Decision Making  ***  {Document critical care time when appropriate:1} {Document review of labs and clinical decision tools ie heart score, Chads2Vasc2 etc:1}  {Document your independent review of radiology images, and any outside records:1} {Document your discussion with family members, caretakers, and with consultants:1} {Document social determinants of health affecting pt's care:1} {Document your decision making why or why not admission, treatments were needed:1} Final Clinical Impression(s) / ED Diagnoses Final diagnoses:  None    Rx / DC Orders ED Discharge Orders     None

## 2022-06-13 ENCOUNTER — Ambulatory Visit (HOSPITAL_COMMUNITY)
Admission: EM | Admit: 2022-06-13 | Discharge: 2022-06-13 | Disposition: A | Discharge status: Home / Self Care | Payer: No Payment, Other | Attending: Family | Admitting: Family

## 2022-06-13 DIAGNOSIS — F1510 Other stimulant abuse, uncomplicated: Secondary | ICD-10-CM | POA: Insufficient documentation

## 2022-06-13 MED ORDER — OLANZAPINE 15 MG PO TABS: 15.0000 mg | ORAL_TABLET | Freq: Every day | ORAL | 0 refills | Status: DC

## 2022-06-13 NOTE — Discharge Instructions (Addendum)
Patient is instructed prior to discharge to:  Take all medications as prescribed by his/her mental healthcare provider. Report any adverse effects and or reactions from the medicines to his/her outpatient provider promptly. Keep all scheduled appointments, to ensure that you are getting refills on time and to avoid any interruption in your medication.  If you are unable to keep an appointment call to reschedule.  Be sure to follow-up with resources and follow-up appointments provided.  Patient has been instructed & cautioned: To not engage in alcohol and or illegal drug use while on prescription medicines. In the event of worsening symptoms, patient is instructed to call the crisis hotline, 911 and or go to the nearest ED for appropriate evaluation and treatment of symptoms. To follow-up with his/her primary care provider for your other medical issues, concerns and or health care needs.  Information: -National Suicide Prevention Lifeline 1-800-SUICIDE or 226-538-7606.  -988 offers 24/7 access to trained crisis counselors who can help people experiencing mental health-related distress. People can call or text 988 or chat 988lifeline.org for themselves or if they are worried about a loved one who may need crisis support.      Substance Abuse Treatment Resources listed Below:  [facility] - Admissions are currently completed Monday through Friday at [facility]; both appointments and walk-ins are accepted.  Any individual that is a [facility] resident may present for a substance abuse screening and assessment for admission.  A person may be referred by numerous sources or self-refer.   Potential clients will be screened for medical necessity and appropriateness for the program.  Clients must meet criteria for high-intensity residential treatment services.  If clinically appropriate, a client will continue with the comprehensive clinical assessment and intake process, as well  as enrollment in the [place].  Address: [address] [place], [place] [address] Admin Hours: [place] to [place] Hours: 24/7 Phone: [phone] Fax: [place] Address: [place], [place], [place] [address] Behavioral Health Urgent Care [facility]) Hours: 24/7 Phone: [phone] Fax: [phone]  [facility] ([facility]): (offers outpatient therapy and intensive outpatient substance abuse therapy).  [address] [place], [place] [address] Phone: [phone]  [place]: Offers FREE recovery skills classes, support groups, 1:1 Peer Support, and Compeer Classes. [address], [place], [place] [address] Phone: [phone] (Call to complete intake).   [facility] [place], [place] [address] Phone: [phone] ext [phone] [facility] provides food, shelter and other programs and services to the homeless men of [place] through our [employer] program.  By offering safe shelter, three meals a day, clean clothing, Biblical counseling, financial planning, vocational training, GED/education and employment assistance, we've helped mend the shattered lives of many homeless men since opening in 1974.  We have approximately 267 beds available, with a max of 312 beds including mats for emergency situations and currently house an average of 270 men a night.  Prospective Client Check-In Information Photo ID Required (State/ Out of State/ [facility]) - if photo ID is not available, clients are required to have a printout of a police/sheriff's criminal history report. Help out with chores around the [place]. No sex offender of any type (pending, charged, registered and/or any other sex related offenses) will be permitted to check in. Must be willing to abide by all rules, regulations, and policies established by the [employer]. The  following  will be provided - shelter, food, clothing, and biblical counseling. If you or someone you know is in need of assistance at our [facility] shelter in [place], [place], please call [phone] ext. [phone].  [facility]-will provide timely access to mental health services for children and adolescents (4-17) and adults presenting in a mental health crisis. The program is designed for those who need urgent Behavioral Health or Substance Use treatment and are not experiencing a medical crisis that would typically require an emergency room visit.    [address] [place], [place] [address] Phone: [phone] [email or web address]  [facility]: Phone#: [phone]  The [facility] SA Intensive Outpatient Program (SAIOP) means structured individual and group addiction activities and services that are provided at an outpatient program designed to assist adult and adolescent consumers to begin recovery and learn skills for recovery maintenance. The [employer] SAIOP program is offered at least 3 hours a day, 3 days a week.SAIOP services shall include a structured program consisting of, but not limited to, the following services: Individual counseling and support; Group counseling and support; Family counseling, training or support; Biochemical assays to identify recent drug use (e.g., urine drug screens); Strategies for relapse prevention to include community and social support systems in treatment; Life skills; Crisis contingency planning; Disease Management; and Treatment support activities that have been adapted or specifically designed for persons with physical disabilities, or persons with co-occurring disorders of mental illness and substance abuse/dependence or mental retardation/developmental disability and substance abuse/dependence. Phone: [phone]  Address:   [facility] will also offer the following outpatient  services: (Monday through Friday 8am-5pm)   Partial Hospitalization Program (PHP) Substance Abuse Intensive Outpatient Program (SA-IOP) Group Therapy Medication Management Peer Living Room We also provide (24/7):  Assessments: Our mental health clinician and providers will conduct a focused mental health evaluation, assessing for immediate safety concerns and further mental health needs. Referral: Our team will provide resources and help connect to community based mental health treatment, when indicated, including psychotherapy, psychiatry, and other specialized behavioral health or substance use disorder services (for those not already in treatment). Transitional Care: Our team providers in person bridging and/or telephonic follow-up during the patient's transition to outpatient services.  [facility] 24-Hour Call Center: [phone] Behavioral Health Crisis Line: [phone]    [facility]     Admissions [address], [place], [place] [address] [phone]

## 2022-06-13 NOTE — Discharge Summary (Signed)
Anthony Wiggins to be D/C'd Home per FNP order. An After Visit Summary was printed and given to the patient by provider. Patient escorted out and D/C home via private auto.  [name]  06/13/2022 10:48 AM

## 2022-06-13 NOTE — ED Provider Notes (Signed)
Behavioral Health Urgent Care Medical Screening Exam  Patient Name: Anthony Wiggins MRN: [id number] Date of Evaluation: 06/13/22 Chief Complaint:   Diagnosis:  Final diagnoses:  Methamphetamine abuse (HCC)    History of Present illness: Anthony Wiggins is a 36 y.o. male. Patient presents voluntarily to [facility] behavioral health for walk-in assessment.  Patient is accompanied by his mother, Stanton Kidney. Patient prefers that his mother remain present during assessment. Patient is assessed, face-to-face, by nurse practitioner. He is seated in assessment area, no acute distress. Consulted with provider, Dr.  Viviano Simas, and chart reviewed on 06/13/2022. He is alert and oriented, pleasant and cooperative during assessment.   Patient states "I want to be at home for Christmas, then I will go to a program."  Alwin would like to seek residential substance use treatment.  He endorses history of methamphetamine use as well as fentanyl use.  Most recent use of both of methamphetamine and fentanyl 6 days ago.  He denies recent alcohol use.  Typically consumes 1 beer every 3 to 4 months.  He denies substance use aside from methamphetamine and fentanyl.  He denies history of residential substance use treatment.  He is motivated to maintain his sobriety and join residential substance use program after he spends Christmas with his family.  Patient's mother would prefer he seek residential substance use program today.  Patient declines.  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  He has been followed by outpatient psychiatry at [facility] behavioral health outpatient.  He reports compliance with olanzapine 15 mg, most recently took his medication 2 days ago.  He endorses history of multiple previous inpatient psychiatric hospitalizations.  No family mental health history reported.  Patient  presents with euthymic mood,  congruent affect. He  denies suicidal and homicidal ideations. Denies history of suicide attempts, denies history of non suicidal self-harm behaviors.  Patient easily  contracts verbally for safety with this Clinical research associate.    Patient has normal speech and behavior.  He denies auditory and visual hallucinations.  Patient is able to converse coherently with goal-directed thoughts and no distractibility or preoccupation.  Denies symptoms of paranoia.  Objectively there is no evidence of psychosis/mania or delusional thinking.  Anthony Wiggins resides with his significant other and three sons, ages 68, 53 and 25 years old. He is employed in [employer], [employer]. Patient endorses decreased sleep and appetite.  Patient offered support and encouragement.  He verbalizes plan to follow-up with outpatient psychiatry.  He verbalizes plan to follow-up with substance use treatment resources provided.   Patient and family are educated and verbalize understanding of mental health resources and other crisis services in the community. They are instructed to call 911 and present to the nearest emergency room should patient experience any suicidal/homicidal ideation, auditory/visual/hallucinations, or detrimental worsening of mental health condition.     Flowsheet Row ED from 06/13/2022 in [facility] ED from 06/12/2022 in [facility] EMERGENCY DEPARTMENT ED from 06/11/2022 in [facility]  C-SSRS RISK CATEGORY No Risk No Risk No Risk       Psychiatric Specialty Exam  Presentation  General Appearance:Appropriate for Environment; Casual  Eye Contact:Fair  Speech:Clear and Coherent; Normal Rate  Speech Volume:Normal  Handedness:Right   Mood and Affect  Mood: Euthymic  Affect: Congruent   Thought Process  Thought Processes: Coherent; Goal Directed; Linear  Descriptions of Associations:Intact  Orientation:Full (Time, Place and  Person)  Thought Content:Logical; WDL  Diagnosis of Schizophrenia or Schizoaffective disorder in past: Yes   Hallucinations:None  Ideas of Reference:None  Suicidal Thoughts:No  Homicidal Thoughts:No   Sensorium  Memory: Immediate Good; Recent Fair  Judgment: Intact  Insight: Fair   Art therapist  Concentration: Good  Attention Span: Good  Recall: Good  Fund of Knowledge: Good  Language: Good   Psychomotor Activity  Psychomotor Activity: Normal   Assets  Assets: Communication Skills; Desire for Improvement; Housing; Leisure Time; Intimacy; Physical Health; Resilience; Social Support   Sleep  Sleep: Fair  Number of hours:  4   No data recorded  Physical Exam: Physical Exam Vitals and nursing note reviewed.  Constitutional:      Appearance: Normal appearance. He is well-developed.  HENT:     Head: Normocephalic and atraumatic.  Cardiovascular:     Rate and Rhythm: Normal rate.  Pulmonary:     Effort: Pulmonary effort is normal.  Musculoskeletal:        General: Normal range of motion.     Cervical back: Normal range of motion.  Skin:    General: Skin is warm and dry.  Neurological:     Mental Status: He is alert and oriented to person, place, and time.  Psychiatric:        Attention and Perception: Attention and perception normal.        Mood and Affect: Mood and affect normal.        Speech: Speech normal.        Behavior: Behavior normal. Behavior is cooperative.        Thought Content: Thought content normal.        Cognition and Memory: Cognition normal.    Review of Systems  Constitutional: Negative.   HENT: Negative.    Eyes: Negative.   Respiratory: Negative.    Cardiovascular: Negative.   Gastrointestinal: Negative.   Genitourinary: Negative.   Musculoskeletal: Negative.   Skin: Negative.   Neurological: Negative.   Psychiatric/Behavioral:  Positive for substance abuse.    Blood pressure 106/83, pulse 60,  resp. rate 18, SpO2 100 %. There is no height or weight on file to calculate BMI.  Musculoskeletal: Strength & Muscle Tone: within normal limits Gait & Station: normal Patient leans: N/A   [facility] MSE Discharge Disposition for Follow up and Recommendations: Based on my evaluation the patient does not appear to have an emergency medical condition and can be discharged with resources and follow up care in outpatient services for Medication Management and Individual Therapy Follow-up with outpatient psychiatry, resources provided. Continue current medications: -Olanzapine 15 mg p.o. daily after dinner/mood Follow-up with substance use treatment, resources provided.  Lenard Lance, FNP 06/13/2022, 10:36 AM

## 2022-06-13 NOTE — ED Triage Notes (Signed)
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 Pt has not used any substances in about 1 week. Pt was hospitalized yesterday due to seizure like activity and insomnia. Pt appears to be clear and coherent at this time. Pt states he was given Ativan in the hospital which helped him to sleep, and he feels well rested today. Pt denies SI/HI,NSSIB, drug and alcohol use and AVH.

## 2022-06-14 ENCOUNTER — Other Ambulatory Visit: Payer: Self-pay

## 2022-06-14 ENCOUNTER — Encounter (HOSPITAL_COMMUNITY): Payer: Self-pay | Admitting: Emergency Medicine

## 2022-06-14 ENCOUNTER — Ambulatory Visit (HOSPITAL_COMMUNITY)
Admission: EM | Admit: 2022-06-14 | Discharge: 2022-06-14 | Discharge status: Against Medical Advice | Payer: No Payment, Other | Attending: Psychiatry | Admitting: Psychiatry

## 2022-06-14 DIAGNOSIS — F1510 Other stimulant abuse, uncomplicated: Secondary | ICD-10-CM | POA: Insufficient documentation

## 2022-06-14 DIAGNOSIS — F1910 Other psychoactive substance abuse, uncomplicated: Secondary | ICD-10-CM | POA: Insufficient documentation

## 2022-06-14 DIAGNOSIS — Z1152 Encounter for screening for COVID-19: Secondary | ICD-10-CM | POA: Insufficient documentation

## 2022-06-14 LAB — COMPREHENSIVE METABOLIC PANEL
ALT: 15 U/L (ref 0–44)
AST: 15 U/L (ref 15–41)
Albumin: 3.9 g/dL (ref 3.5–5.0)
Alkaline Phosphatase: 52 U/L (ref 38–126)
Anion gap: 4 — ABNORMAL LOW (ref 5–15)
BUN: 14 mg/dL (ref 6–20)
CO2: 26 mmol/L (ref 22–32)
Calcium: 9.3 mg/dL (ref 8.9–10.3)
Chloride: 108 mmol/L (ref 98–111)
Creatinine, Ser: 1.19 mg/dL (ref 0.61–1.24)
GFR, Estimated: >60 mL/min (ref >60)
Glucose, Bld: 94 mg/dL (ref 70–99)
Potassium: 4.4 mmol/L (ref 3.5–5.1)
Sodium: 138 mmol/L (ref 135–145)
Total Bilirubin: 0.1 mg/dL — ABNORMAL LOW (ref 0.3–1.2)
Total Protein: 6.6 g/dL (ref 6.5–8.1)

## 2022-06-14 LAB — CBC WITH DIFFERENTIAL/PLATELET
Abs Immature Granulocytes: 0.03 10*3/uL (ref 0.00–0.07)
Basophils Absolute: 0.1 10*3/uL (ref 0.0–0.1)
Basophils Relative: 1 %
Eosinophils Absolute: 0.1 10*3/uL (ref 0.0–0.5)
Eosinophils Relative: 2 %
HCT: 37.6 % — ABNORMAL LOW (ref 39.0–52.0)
Hemoglobin: 12.5 g/dL — ABNORMAL LOW (ref 13.0–17.0)
Immature Granulocytes: 0 %
Lymphocytes Relative: 26 %
Lymphs Abs: 2 10*3/uL (ref 0.7–4.0)
MCH: 26.6 pg (ref 26.0–34.0)
MCHC: 33.2 g/dL (ref 30.0–36.0)
MCV: 80 fL (ref 80.0–100.0)
Monocytes Absolute: 0.4 10*3/uL (ref 0.1–1.0)
Monocytes Relative: 5 %
Neutro Abs: 5.1 10*3/uL (ref 1.7–7.7)
Neutrophils Relative %: 66 %
Platelets: 316 10*3/uL (ref 150–400)
RBC: 4.7 MIL/uL (ref 4.22–5.81)
RDW: 13.6 % (ref 11.5–15.5)
WBC: 7.7 10*3/uL (ref 4.0–10.5)
nRBC: 0 % (ref 0.0–0.2)

## 2022-06-14 LAB — LIPID PANEL
Cholesterol: 153 mg/dL (ref 0–200)
HDL: 45 mg/dL (ref >40)
LDL Cholesterol: 89 mg/dL (ref 0–99)
Total CHOL/HDL Ratio: 3.4 RATIO
Triglycerides: 93 mg/dL (ref <150)
VLDL: 19 mg/dL (ref 0–40)

## 2022-06-14 LAB — POCT URINE DRUG SCREEN - MANUAL ENTRY (I-SCREEN)
POC Amphetamine UR: NOT DETECTED
POC Buprenorphine (BUP): NOT DETECTED
POC Cocaine UR: NOT DETECTED
POC Marijuana UR: NOT DETECTED
POC Methadone UR: NOT DETECTED
POC Methamphetamine UR: NOT DETECTED
POC Morphine: NOT DETECTED
POC Oxazepam (BZO): POSITIVE — AB
POC Oxycodone UR: NOT DETECTED
POC Secobarbital (BAR): NOT DETECTED

## 2022-06-14 LAB — TSH: TSH: 0.704 u[IU]/mL (ref 0.350–4.500)

## 2022-06-14 LAB — RESP PANEL BY RT-PCR (RSV, FLU A&B, COVID)  RVPGX2
Influenza A by PCR: NEGATIVE
Influenza B by PCR: NEGATIVE
Resp Syncytial Virus by PCR: NEGATIVE
SARS Coronavirus 2 by RT PCR: NEGATIVE

## 2022-06-14 LAB — ETHANOL: Alcohol, Ethyl (B): <10 mg/dL (ref <10)

## 2022-06-14 MED ORDER — OLANZAPINE 2.5 MG PO TABS
2.5000 mg | ORAL_TABLET | Freq: Once | ORAL | Status: AC
  Administered 2022-06-14: 2.5 mg via ORAL
  Filled 2022-06-14: qty 1

## 2022-06-14 MED ORDER — HYDROXYZINE HCL 25 MG PO TABS
25.0000 mg | ORAL_TABLET | Freq: Three times a day (TID) | ORAL | Status: DC | PRN
  Administered 2022-06-14 (×2): 25 mg via ORAL
  Filled 2022-06-14 (×2): qty 1

## 2022-06-14 MED ORDER — AMLODIPINE BESYLATE 10 MG PO TABS
10.0000 mg | ORAL_TABLET | Freq: Every day | ORAL | Status: DC
  Administered 2022-06-14: 10 mg via ORAL
  Filled 2022-06-14: qty 1

## 2022-06-14 MED ORDER — OLANZAPINE 5 MG PO TABS: 5.0000 mg | ORAL_TABLET | Freq: Every day | ORAL | Status: DC

## 2022-06-14 MED ORDER — TRAZODONE HCL 50 MG PO TABS: 50.0000 mg | ORAL_TABLET | Freq: Every evening | ORAL | Status: DC | PRN

## 2022-06-14 MED ORDER — MAGNESIUM HYDROXIDE 400 MG/5ML PO SUSP: 30.0000 mL | Freq: Every day | ORAL | Status: DC | PRN

## 2022-06-14 MED ORDER — AMLODIPINE BESYLATE 5 MG PO TABS: 5.0000 mg | ORAL_TABLET | Freq: Once | ORAL | Status: DC

## 2022-06-14 MED ORDER — ACETAMINOPHEN 325 MG PO TABS
650.0000 mg | ORAL_TABLET | Freq: Four times a day (QID) | ORAL | Status: DC | PRN
  Administered 2022-06-14 (×2): 650 mg via ORAL
  Filled 2022-06-14 (×2): qty 2

## 2022-06-14 MED ORDER — ALUM & MAG HYDROXIDE-SIMETH 200-200-20 MG/5ML PO SUSP: 30.0000 mL | ORAL | Status: DC | PRN

## 2022-06-14 NOTE — ED Notes (Signed)
Pt c/o of chest pain. Provider notified. Pt was given an EKG

## 2022-06-14 NOTE — BH Assessment (Addendum)
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 Pt reports his motivation is kids and reports he is trying to go back to work. Pt denies SI, HI, AVH and reports last use was about 6 days ago.

## 2022-06-14 NOTE — ED Notes (Signed)
Patient has been brought on the unit and familiarized with unit, he has been given a salad and chicken salad sandwich

## 2022-06-14 NOTE — ED Notes (Signed)
Patient unable to urinate at this time, unable to obtain a urine sample.

## 2022-06-14 NOTE — ED Notes (Signed)
Patient continues to complain of anxiety and a tight feeling in his chest. His EKG was normal and his blood pressure has decreased. He voiced the tylenol was effective for his headache. Provider aware.

## 2022-06-14 NOTE — ED Provider Notes (Signed)
[facility] Urgent Care Continuous Assessment Admission H&P  Date: 06/14/22 Patient Name: Anthony Wiggins MRN: [id number] Chief Complaint:  Chief Complaint  Patient presents with   Addiction Problem     Diagnoses:  Final diagnoses:  Methamphetamine abuse [facility])   HPI: Pt presents voluntarily to [facility] behavioral health for walk-in assessment.  Pt is accompanied by his mother, Anthony Wiggins, who remains with pt throughout the assessment as per pt verbal consent/request. Pt is assessed face-to-face by nurse practitioner.   Anthony Wiggins, 36 y.o., male patient seen face to face by this provider, and chart reviewed on 06/14/22.  On evaluation Anthony Wiggins reports he is presenting today due to "need to do the class, detox". When asked what he is referring to, pt states he would like to be admitted to detox from methamphetamine and fentanyl. Pt reports last use of methamphetamine 6 days ago, last use of fentanyl 10 days ago. Pt reports methamphetamine use 3 times/day for the past 2 years. He reports fentanyl use 2 times/day for the past 2 to 4 years. Pt denies use of alcohol, marijuana, other substance use.   Pt denies suicidal, homicidal or violent ideation. When asked about auditory visual hallucinations, he states he "can't really explain" and that they occur when he's sleeping. When asked to elaborate, he states he hears noises and feels that his girlfriend is looking at him but she's not. He states he experiences night terrors although when he wakes up he cannot remember his dreams.   Pt denies suicidal, homicidal or violent ideation.   Pt denies history of suicide attempt. He reports history of 1 inpatient psychiatric hospitalization 1 to 2 years ago. 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 Discharge medications at that time included olanzapine 15mg  oral daily with supper  and amlodipine 10mg  oral daily.  Pt is not connected with medication management of counseling. Per , has been difficult to access medications.   Pt reports he is living with his girlfriend and his 3 kids, 51 y/o, 67 y/o, 65 y/o.   Pt reports he is not currently employed. He states it has been 24 days since he was last employed. When asked what his role was, pt does not answer.   Pt unaware of family psychiatric history. Per 14, pt's maternal aunt and cousins with history of bipolar disorder or schizophrenia.  Per 12, primary concerns are pt is not sleeping and is paranoid. She states that pt sleeps 1 to 2 hours/night. She states pt doesn't want to go anywhere by himself. Pt denies this is true. He states he does ask people to go to store with him but he is ok going to the store on his own. She reports this week he was looking out of the back of the car like something was coming. When asked about this, pt does endorse he was looking out of the back of the car.   Discussed admission to continuous assessment. Discussed anticipate discharge on 06/15/22. Debra and pt agree with admission to continuous assessment. Anthony Wiggins states she can pick pt up tomorrow at 10AM. Her number is [phone].  After being admitted to continuous assessment, pt asked to speak with me regarding chest pain. He had initially denied chest pain during assessment. Pt assessed in alert and oriented x3, in no acute distress, non toxic appearing. He is vague with chest pain and unable to describe. He denies  any factors worsen or improve pain. He is requesting ativan. He states he was provided ativan in the ED. Discussed would not be providing ativan. EKG obtained and wnl. Reviewed EKG findings with pt. Pt reports chest pain now resolved.   PHQ 2-9:  Flowsheet Row ED from 06/10/2022 in [facility]  Thoughts that you would be better off dead, or of hurting yourself in some way Not at all  PHQ-9  Total Score 4       Flowsheet Row ED from 06/14/2022 in [facility] ED from 06/13/2022 in [facility] ED from 06/12/2022 in [facility] EMERGENCY DEPARTMENT  C-SSRS RISK CATEGORY No Risk No Risk No Risk        Total Time spent with patient: 45 minutes  Musculoskeletal  Strength & Muscle Tone: within normal limits Gait & Station: normal Patient leans: N/A  Psychiatric Specialty Exam  Presentation General Appearance:  Appropriate for Environment; Casual  Eye Contact: Minimal  Speech: Slow; Clear and Coherent  Speech Volume: Normal  Handedness: Right   Mood and Affect  Mood: Euthymic  Affect: -- (irritable, mildly labile)   Thought Process  Thought Processes: Other (comment) (overall coherent, mildly disorganized at times)  Descriptions of Associations:Circumstantial  Orientation:Full (Time, Place and Person)  Thought Content:Other (comment) (mild paranoia noted)  Diagnosis of Schizophrenia or Schizoaffective disorder in past: Yes   Hallucinations:Hallucinations: Other (comment) (see HPI)  Ideas of Reference:None  Suicidal Thoughts:Suicidal Thoughts: No  Homicidal Thoughts:Homicidal Thoughts: No   Sensorium  Memory: Immediate Fair; Recent Fair  Judgment: Intact  Insight: Shallow   Executive Functions  Concentration: Fair  Attention Span: Fair  Recall: Fair  Fund of Knowledge: Fair  Language: Fair   Psychomotor Activity  Psychomotor Activity: Psychomotor Activity: Normal   Assets  Assets: Communication Skills; Desire for Improvement; Financial Resources/Insurance; Housing; Intimacy; Physical Health; Resilience; Social Support   Sleep  Sleep: Sleep: Poor   Nutritional Assessment (For OBS and FBC admissions only) Has the patient had a weight loss or gain of 10 pounds or more in the last 3 months?: No Has the patient had a decrease in food  intake/or appetite?: No Does the patient have dental problems?: No Does the patient have eating habits or behaviors that may be indicators of an eating disorder including binging or inducing vomiting?: No Has the patient recently lost weight without trying?: 0 Has the patient been eating poorly because of a decreased appetite?: 0 Malnutrition Screening Tool Score: 0    Physical Exam Constitutional:      General: He is not in acute distress.    Appearance: He is not ill-appearing, toxic-appearing or diaphoretic.  Eyes:     General: No scleral icterus. Cardiovascular:     Rate and Rhythm: Normal rate.  Pulmonary:     Effort: Pulmonary effort is normal. No respiratory distress.  Neurological:     Mental Status: He is alert and oriented to person, place, and time.  Psychiatric:        Attention and Perception: Attention normal.        Mood and Affect: Mood normal.        Behavior: Behavior is slowed.    Review of Systems  Constitutional:  Negative for chills and fever.  Respiratory:  Negative for shortness of breath.   Cardiovascular:  Positive for chest pain. Negative for palpitations.  Gastrointestinal:  Negative for abdominal pain.  Neurological:  Negative for headaches.  Psychiatric/Behavioral:  Positive for substance abuse. The patient is nervous/anxious.     Blood pressure 138/88, pulse 85, temperature 98.3 F (36.8 C), temperature source Oral, resp. rate 18, SpO2 100 %. There is no height or weight on file to calculate BMI.  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 No  Has the patient been a risk to self in the past 6 months? No .    Has the patient been a risk to self within the distant past? No   Is the patient a risk to others?  No   Has the patient been a risk to others in the past 6 months? No   Has the patient been a risk to others within the distant past? No   Past Medical History:  Past Medical History:  Diagnosis Date   Dental caries    Schizophrenia (HCC)    No past surgical history on file.  Family History:  Family History  Problem Relation Age of Onset   Diabetes Mother    Hypertension Mother     Social History:  Social History   Socioeconomic History   Marital status: Single    Spouse name: Not on file   Number of children: Not on file   Years of education: Not on file   Highest education level: Not on file  Occupational History   Not on file  Tobacco Use   Smoking status: Every Day    Packs/day: 0.50    Types: Cigarettes   Smokeless tobacco: Never  Substance and Sexual Activity   Alcohol use: Yes    Alcohol/week: 4.0 standard drinks of alcohol    Types: 4 Cans of beer per week   Drug use: Yes    Comment: uses ICE   Sexual activity: Not on file  Other Topics Concern   Not on file  Social History Narrative   Not on file   Social Determinants of Health   Financial Resource Strain: Not on file  Food Insecurity: Not on file  Transportation Needs: Not on file  Physical Activity: Not on file  Stress: Not on file  Social Connections: Not on file  Intimate Partner Violence: Not on file    SDOH:  SDOH Screenings   Alcohol Screen: Low Risk  (05/07/2020)  Depression (PHQ2-9): Low Risk  (06/10/2022)  Tobacco Use: High Risk (06/12/2022)    Last Labs:  Admission on 06/14/2022  Component Date Value Ref Range Status   SARS Coronavirus 2 by RT PCR 06/14/2022 NEGATIVE  NEGATIVE Final   Comment: (NOTE) SARS-CoV-2 target nucleic acids are NOT DETECTED.  The SARS-CoV-2 RNA is generally detectable in upper respiratory specimens during the acute phase of infection. The lowest concentration of SARS-CoV-2 viral copies this assay can detect is 138 copies/mL. A negative result does  not preclude SARS-Cov-2 infection and should not be used as the sole basis for treatment or other patient management decisions. A negative result may occur with  improper specimen collection/handling, submission of specimen other than nasopharyngeal swab, presence of viral mutation(s) within the areas targeted by this assay, and inadequate number of viral copies(<138 copies/mL). A negative result must be combined with clinical observations, patient history, and epidemiological information. The expected result is Negative.  Fact Sheet for Patients:  BloggerCourse.com  Fact Sheet for Healthcare Providers:  SeriousBroker.it  This  test is no                          t yet approved or cleared by the [place] and  has been authorized for detection and/or diagnosis of SARS-CoV-2 by FDA under an Emergency Use Authorization (EUA). This EUA will remain  in effect (meaning this test can be used) for the duration of the COVID-19 declaration under Section 564(b)(1) of the Act, 21 U.S.C.section 360bbb-3(b)(1), unless the authorization is terminated  or revoked sooner.       Influenza A by PCR 06/14/2022 NEGATIVE  NEGATIVE Final   Influenza B by PCR 06/14/2022 NEGATIVE  NEGATIVE Final   Comment: (NOTE) The Xpert Xpress SARS-CoV-2/FLU/RSV plus assay is intended as an aid in the diagnosis of influenza from Nasopharyngeal swab specimens and should not be used as a sole basis for treatment. Nasal washings and aspirates are unacceptable for Xpert Xpress SARS-CoV-2/FLU/RSV testing.  Fact Sheet for Patients: BloggerCourse.com  Fact Sheet for Healthcare Providers: SeriousBroker.it  This test is not yet approved or cleared by the [place] FDA and has been authorized for detection and/or diagnosis of SARS-CoV-2 by FDA under an Emergency Use Authorization (EUA). This EUA will remain in  effect (meaning this test can be used) for the duration of the COVID-19 declaration under Section 564(b)(1) of the Act, 21 U.S.C. section 360bbb-3(b)(1), unless the authorization is terminated or revoked.     Resp Syncytial Virus by PCR 06/14/2022 NEGATIVE  NEGATIVE Final   Comment: (NOTE) Fact Sheet for Patients: BloggerCourse.com  Fact Sheet for Healthcare Providers: SeriousBroker.it  This test is not yet approved or cleared by the [place] FDA and has been authorized for detection and/or diagnosis of SARS-CoV-2 by FDA under an Emergency Use Authorization (EUA). This EUA will remain in effect (meaning this test can be used) for the duration of the COVID-19 declaration under Section 564(b)(1) of the Act, 21 U.S.C. section 360bbb-3(b)(1), unless the authorization is terminated or revoked.  Performed at [facility], [address] [place], [place] [address]    WBC 06/14/2022 7.7  4.0 - 10.5 K/uL Final   RBC 06/14/2022 4.70  4.22 - 5.81 MIL/uL Final   Hemoglobin 06/14/2022 12.5 (L)  13.0 - 17.0 g/dL Final   HCT 60/45/4098 37.6 (L)  39.0 - 52.0 % Final   MCV 06/14/2022 80.0  80.0 - 100.0 fL Final   MCH 06/14/2022 26.6  26.0 - 34.0 pg Final   MCHC 06/14/2022 33.2  30.0 - 36.0 g/dL Final   RDW 11/91/4782 13.6  11.5 - 15.5 % Final   Platelets 06/14/2022 316  150 - 400 K/uL Final   nRBC 06/14/2022 0.0  0.0 - 0.2 % Final   Neutrophils Relative % 06/14/2022 66  % Final   Neutro Abs 06/14/2022 5.1  1.7 - 7.7 K/uL Final   Lymphocytes Relative 06/14/2022 26  % Final   Lymphs Abs 06/14/2022 2.0  0.7 - 4.0 K/uL Final   Monocytes Relative 06/14/2022 5  % Final   Monocytes Absolute 06/14/2022 0.4  0.1 - 1.0 K/uL Final   Eosinophils Relative 06/14/2022 2  % Final   Eosinophils Absolute 06/14/2022 0.1  0.0 - 0.5 K/uL Final   Basophils Relative 06/14/2022 1  % Final   Basophils Absolute 06/14/2022 0.1  0.0 - 0.1 K/uL Final    Immature Granulocytes 06/14/2022 0  % Final   Abs Immature Granulocytes 06/14/2022 0.03  0.00 -  0.07 K/uL Final   Performed at [facility], [address], [place], [place] [address]   Sodium 06/14/2022 138  135 - 145 mmol/L Final   Potassium 06/14/2022 4.4  3.5 - 5.1 mmol/L Final   Chloride 06/14/2022 108  98 - 111 mmol/L Final   CO2 06/14/2022 26  22 - 32 mmol/L Final   Glucose, Bld 06/14/2022 94  70 - 99 mg/dL Final   Glucose reference range applies only to samples taken after fasting for at least 8 hours.   BUN 06/14/2022 14  6 - 20 mg/dL Final   Creatinine, Ser 06/14/2022 1.19  0.61 - 1.24 mg/dL Final   Calcium 60/45/4098 9.3  8.9 - 10.3 mg/dL Final   Total Protein 11/91/4782 6.6  6.5 - 8.1 g/dL Final   Albumin 95/62/1308 3.9  3.5 - 5.0 g/dL Final   AST 65/78/4696 15  15 - 41 U/L Final   ALT 06/14/2022 15  0 - 44 U/L Final   Alkaline Phosphatase 06/14/2022 52  38 - 126 U/L Final   Total Bilirubin 06/14/2022 0.1 (L)  0.3 - 1.2 mg/dL Final   GFR, Estimated 06/14/2022 >60  >60 mL/min Final   Comment: (NOTE) Calculated using the CKD-EPI Creatinine Equation (2021)    Anion gap 06/14/2022 4 (L)  5 - 15 Final   Performed at [facility], [address] [place], [place] [address]   Alcohol, Ethyl (B) 06/14/2022 <10  <10 mg/dL Final   Comment: (NOTE) Lowest detectable limit for serum alcohol is 10 mg/dL.  For medical purposes only. Performed at [facility], [address], [place], [place] [address]    Cholesterol 06/14/2022 153  0 - 200 mg/dL Final   Triglycerides 40/03/2724 93  <150 mg/dL Final   HDL 36/64/4034 45  >40 mg/dL Final   Total CHOL/HDL Ratio 06/14/2022 3.4  RATIO Final   VLDL 06/14/2022 19  0 - 40 mg/dL Final   LDL Cholesterol 06/14/2022 89  0 - 99 mg/dL Final   Comment:        Total Cholesterol/HDL:CHD Risk Coronary Heart Disease Risk Table                     Men   Women  1/2 Average Risk   3.4   3.3  Average Risk       5.0   4.4  2 X  Average Risk   9.6   7.1  3 X Average Risk  23.4   11.0        Use the calculated Patient Ratio above and the CHD Risk Table to determine the patient's CHD Risk.        ATP III CLASSIFICATION (LDL):  <100     mg/dL   Optimal  742-595  mg/dL   Near or Above                    Optimal  130-159  mg/dL   Borderline  638-756  mg/dL   High  >433     mg/dL   Very High Performed at [facility], [address] [place], [place] [address]    TSH 06/14/2022 0.704  0.350 - 4.500 uIU/mL Final   Comment: Performed by a 3rd Generation assay with a functional sensitivity of <=0.01 uIU/mL. Performed at [facility], [address] [place], [place] [address]    POC Amphetamine UR 06/14/2022 None Detected  NONE  DETECTED (Cut Off Level 1000 ng/mL) Final   POC Secobarbital (BAR) 06/14/2022 None Detected  NONE DETECTED (Cut Off Level 300 ng/mL) Final   POC Buprenorphine (BUP) 06/14/2022 None Detected  NONE DETECTED (Cut Off Level 10 ng/mL) Final   POC Oxazepam (BZO) 06/14/2022 Positive (A)  NONE DETECTED (Cut Off Level 300 ng/mL) Final   POC Cocaine UR 06/14/2022 None Detected  NONE DETECTED (Cut Off Level 300 ng/mL) Final   POC Methamphetamine UR 06/14/2022 None Detected  NONE DETECTED (Cut Off Level 1000 ng/mL) Final   POC Morphine 06/14/2022 None Detected  NONE DETECTED (Cut Off Level 300 ng/mL) Final   POC Methadone UR 06/14/2022 None Detected  NONE DETECTED (Cut Off Level 300 ng/mL) Final   POC Oxycodone UR 06/14/2022 None Detected  NONE DETECTED (Cut Off Level 100 ng/mL) Final   POC Marijuana UR 06/14/2022 None Detected  NONE DETECTED (Cut Off Level 50 ng/mL) Final  Admission on 06/12/2022, Discharged on 06/13/2022  Component Date Value Ref Range Status   WBC 06/12/2022 9.7  4.0 - 10.5 K/uL Final   RBC 06/12/2022 4.86  4.22 - 5.81 MIL/uL Final   Hemoglobin 06/12/2022 12.8 (L)  13.0 - 17.0 g/dL Final   HCT 16/03/9603 39.0  39.0 - 52.0 % Final   MCV 06/12/2022 80.2  80.0 - 100.0 fL  Final   MCH 06/12/2022 26.3  26.0 - 34.0 pg Final   MCHC 06/12/2022 32.8  30.0 - 36.0 g/dL Final   RDW 54/02/8118 13.5  11.5 - 15.5 % Final   Platelets 06/12/2022 335  150 - 400 K/uL Final   nRBC 06/12/2022 0.0  0.0 - 0.2 % Final   Neutrophils Relative % 06/12/2022 67  % Final   Neutro Abs 06/12/2022 6.5  1.7 - 7.7 K/uL Final   Lymphocytes Relative 06/12/2022 27  % Final   Lymphs Abs 06/12/2022 2.7  0.7 - 4.0 K/uL Final   Monocytes Relative 06/12/2022 4  % Final   Monocytes Absolute 06/12/2022 0.4  0.1 - 1.0 K/uL Final   Eosinophils Relative 06/12/2022 1  % Final   Eosinophils Absolute 06/12/2022 0.1  0.0 - 0.5 K/uL Final   Basophils Relative 06/12/2022 1  % Final   Basophils Absolute 06/12/2022 0.1  0.0 - 0.1 K/uL Final   Immature Granulocytes 06/12/2022 0  % Final   Abs Immature Granulocytes 06/12/2022 0.03  0.00 - 0.07 K/uL Final   Performed at [facility], [address], [place], [place] [address]   Sodium 06/12/2022 139  135 - 145 mmol/L Final   Potassium 06/12/2022 3.0 (L)  3.5 - 5.1 mmol/L Final   Chloride 06/12/2022 103  98 - 111 mmol/L Final   CO2 06/12/2022 25  22 - 32 mmol/L Final   Glucose, Bld 06/12/2022 106 (H)  70 - 99 mg/dL Final   Glucose reference range applies only to samples taken after fasting for at least 8 hours.   BUN 06/12/2022 16  6 - 20 mg/dL Final   Creatinine, Ser 06/12/2022 1.31 (H)  0.61 - 1.24 mg/dL Final   Calcium 95/62/1308 9.0  8.9 - 10.3 mg/dL Final   GFR, Estimated 06/12/2022 >60  >60 mL/min Final   Comment: (NOTE) Calculated using the CKD-EPI Creatinine Equation (2021)    Anion gap 06/12/2022 11  5 - 15 Final   Performed at [facility], [address], [place], [place] [address]   Opiates 06/12/2022 NONE DETECTED  NONE DETECTED Final   Cocaine 06/12/2022  NONE DETECTED  NONE DETECTED Final   Benzodiazepines 06/12/2022 NONE DETECTED  NONE DETECTED Final   Amphetamines 06/12/2022 POSITIVE (A)  NONE DETECTED Final    Tetrahydrocannabinol 06/12/2022 NONE DETECTED  NONE DETECTED Final   Barbiturates 06/12/2022 NONE DETECTED  NONE DETECTED Final   Comment: (NOTE) DRUG SCREEN FOR MEDICAL PURPOSES ONLY.  IF CONFIRMATION IS NEEDED FOR ANY PURPOSE, NOTIFY LAB WITHIN 5 DAYS.  LOWEST DETECTABLE LIMITS FOR URINE DRUG SCREEN Drug Class                     Cutoff (ng/mL) Amphetamine and metabolites    1000 Barbiturate and metabolites    200 Benzodiazepine                 200 Opiates and metabolites        300 Cocaine and metabolites        300 THC                            50 Performed at [facility], [address], [place], [place] [address]   Admission on 06/10/2022, Discharged on 06/10/2022  Component Date Value Ref Range Status   SARS Coronavirus 2 by RT PCR 06/10/2022 NEGATIVE  NEGATIVE Final   Comment: (NOTE) SARS-CoV-2 target nucleic acids are NOT DETECTED.  The SARS-CoV-2 RNA is generally detectable in upper respiratory specimens during the acute phase of infection. The lowest concentration of SARS-CoV-2 viral copies this assay can detect is 138 copies/mL. A negative result does not preclude SARS-Cov-2 infection and should not be used as the sole basis for treatment or other patient management decisions. A negative result may occur with  improper specimen collection/handling, submission of specimen other than nasopharyngeal swab, presence of viral mutation(s) within the areas targeted by this assay, and inadequate number of viral copies(<138 copies/mL). A negative result must be combined with clinical observations, patient history, and epidemiological information. The expected result is Negative.  Fact Sheet for Patients:  BloggerCourse.com  Fact Sheet for Healthcare Providers:  SeriousBroker.it  This test is no                          t yet approved or cleared by the [place] FDA and  has been authorized for detection  and/or diagnosis of SARS-CoV-2 by FDA under an Emergency Use Authorization (EUA). This EUA will remain  in effect (meaning this test can be used) for the duration of the COVID-19 declaration under Section 564(b)(1) of the Act, 21 U.S.C.section 360bbb-3(b)(1), unless the authorization is terminated  or revoked sooner.       Influenza A by PCR 06/10/2022 NEGATIVE  NEGATIVE Final   Influenza B by PCR 06/10/2022 NEGATIVE  NEGATIVE Final   Comment: (NOTE) The Xpert Xpress SARS-CoV-2/FLU/RSV plus assay is intended as an aid in the diagnosis of influenza from Nasopharyngeal swab specimens and should not be used as a sole basis for treatment. Nasal washings and aspirates are unacceptable for Xpert Xpress SARS-CoV-2/FLU/RSV testing.  Fact Sheet for Patients: BloggerCourse.com  Fact Sheet for Healthcare Providers: SeriousBroker.it  This test is not yet approved or cleared by the [place] FDA and has been authorized for detection and/or diagnosis of SARS-CoV-2 by FDA under an Emergency Use Authorization (EUA). This EUA will remain in effect (meaning this test can be used) for the duration of the COVID-19 declaration under Section 564(b)(1) of the  Act, 21 U.S.C. section 360bbb-3(b)(1), unless the authorization is terminated or revoked.     Resp Syncytial Virus by PCR 06/10/2022 NEGATIVE  NEGATIVE Final   Comment: (NOTE) Fact Sheet for Patients: BloggerCourse.comhttps://www.fda.gov/media/152166/download  Fact Sheet for Healthcare Providers: SeriousBroker.ithttps://www.fda.gov/media/152162/download  This test is not yet approved or cleared by the [place] FDA and has been authorized for detection and/or diagnosis of SARS-CoV-2 by FDA under an Emergency Use Authorization (EUA). This EUA will remain in effect (meaning this test can be used) for the duration of the COVID-19 declaration under Section 564(b)(1) of the Act, 21 U.S.C. section 360bbb-3(b)(1), unless  the authorization is terminated or revoked.  Performed at [facility], [address] [place], [place] [id number]   Admission on 06/09/2022, Discharged on 06/09/2022  Component Date Value Ref Range Status   Sodium 06/09/2022 140  135 - 145 mmol/L Final   Potassium 06/09/2022 3.2 (L)  3.5 - 5.1 mmol/L Final   Chloride 06/09/2022 104  98 - 111 mmol/L Final   CO2 06/09/2022 27  22 - 32 mmol/L Final   Glucose, Bld 06/09/2022 111 (H)  70 - 99 mg/dL Final   Glucose reference range applies only to samples taken after fasting for at least 8 hours.   BUN 06/09/2022 17  6 - 20 mg/dL Final   Creatinine, Ser 06/09/2022 1.14  0.61 - 1.24 mg/dL Final   Calcium 32/44/010212/16/2023 9.3  8.9 - 10.3 mg/dL Final   Total Protein 72/53/664412/16/2023 8.0  6.5 - 8.1 g/dL Final   Albumin 03/47/425912/16/2023 4.5  3.5 - 5.0 g/dL Final   AST 56/38/756412/16/2023 23  15 - 41 U/L Final   ALT 06/09/2022 18  0 - 44 U/L Final   Alkaline Phosphatase 06/09/2022 58  38 - 126 U/L Final   Total Bilirubin 06/09/2022 0.5  0.3 - 1.2 mg/dL Final   GFR, Estimated 06/09/2022 >60  >60 mL/min Final   Comment: (NOTE) Calculated using the CKD-EPI Creatinine Equation (2021)    Anion gap 06/09/2022 9  5 - 15 Final   Performed at [facility], [address] [place], [place] [address]   Alcohol, Ethyl (B) 06/09/2022 <10  <10 mg/dL Final   Comment: (NOTE) Lowest detectable limit for serum alcohol is 10 mg/dL.  For medical purposes only. Performed at [facility], [address] [place], [place] [address]    WBC 06/09/2022 9.5  4.0 - 10.5 K/uL Final   RBC 06/09/2022 5.23  4.22 - 5.81 MIL/uL Final   Hemoglobin 06/09/2022 13.6  13.0 - 17.0 g/dL Final   HCT 66/06/301612/16/2023 42.0  39.0 - 52.0 % Final   MCV 06/09/2022 80.3  80.0 - 100.0 fL Final   MCH 06/09/2022 26.0  26.0 - 34.0 pg Final   MCHC 06/09/2022 32.4  30.0 - 36.0 g/dL Final   RDW 01/09/323512/16/2023 13.3  11.5 - 15.5 % Final   Platelets 06/09/2022 337  150 -  400 K/uL Final   nRBC 06/09/2022 0.0  0.0 - 0.2 % Final   Neutrophils Relative % 06/09/2022 64  % Final   Neutro Abs 06/09/2022 6.1  1.7 - 7.7 K/uL Final   Lymphocytes Relative 06/09/2022 29  % Final   Lymphs Abs 06/09/2022 2.8  0.7 - 4.0 K/uL Final   Monocytes Relative 06/09/2022 5  % Final   Monocytes Absolute 06/09/2022 0.5  0.1 - 1.0 K/uL Final   Eosinophils Relative 06/09/2022 1  % Final   Eosinophils Absolute 06/09/2022 0.1  0.0 - 0.5 K/uL Final   Basophils Relative 06/09/2022 1  % Final   Basophils Absolute 06/09/2022 0.1  0.0 - 0.1 K/uL Final   Immature Granulocytes 06/09/2022 0  % Final   Abs Immature Granulocytes 06/09/2022 0.04  0.00 - 0.07 K/uL Final   Performed at [facility], [address], [place], [place] [address]    Allergies: Fish allergy and Naproxen  PTA Medications: (Not in a hospital admission)   Medical Decision Making  Admitted to continuous assessment, to be reassessed in the morning  Lab Orders         Resp panel by RT-PCR (RSV, Flu A&B, Covid) Anterior Nasal Swab         CBC with Differential/Platelet         Comprehensive metabolic panel         Hemoglobin A1c         Ethanol         Lipid panel         TSH         POCT Urine Drug Screen - (I-Screen)     Meds ordered this encounter  Medications   acetaminophen (TYLENOL) tablet 650 mg   alum & mag hydroxide-simeth (MAALOX/MYLANTA) 200-200-20 MG/5ML suspension 30 mL   magnesium hydroxide (MILK OF MAGNESIA) suspension 30 mL   hydrOXYzine (ATARAX) tablet 25 mg   traZODone (DESYREL) tablet 50 mg   DISCONTD: amLODipine (NORVASC) tablet 5 mg   amLODipine (NORVASC) tablet 10 mg   OLANZapine (ZYPREXA) tablet 2.5 mg   OLANZapine (ZYPREXA) tablet 5 mg   Recommendations  Based on my evaluation the patient does not appear to have an emergency medical condition.  Lauree Chandler, NP 06/14/22  7:50 PM

## 2022-06-14 NOTE — ED Notes (Signed)
Pending AMA for discharge home.

## 2022-06-14 NOTE — ED Notes (Signed)
Pt c/o chest pain 7/10. Tylenol given. BP rechecked manually, 148/100. Norvasc ordered by provider. Denies SI/HI/AVH. Cooperative with all admission assessments.

## 2022-06-14 NOTE — ED Notes (Signed)
Pt c/o elivated temp.  Took temp, resulted as 98.0.  Pt currently wearing sweater, beanie, and long sleeve shirt.  Encouraged pt to remove coat to assist with reported warmth.  Pt currently still in all clothing.

## 2022-06-14 NOTE — ED Provider Notes (Signed)
Report received the patient is requesting to sign himself out because he was not getting ativan.  Per chart review, Valentina Gu presented earlier today requesting substance abuse treatment/detox from methamphetamine and fentanyl. Pt reports last use of methamphetamine 6 days ago, last use of fentanyl 10 days ago. Pt reports methamphetamine use 3 times/day for the past 2 years. He reports fentanyl use 2 times/day for the past 2 to 4 years. Pt denies use of alcohol, marijuana, other substance use.   On presentation, the patient is standing in the continuous observation milieu facing the nurses station all dressed up.  On evaluation, patient is alert, oriented x 4, and cooperative. Speech is clear, coherent and logical. Pt appears casual. Eye contact is good. Mood is euthymic, affect is congruent with mood. Thought process is goal directed and thought content is WNL. Pt denies SI/HI/AVH. There is no indication that the patient is responding to internal stimuli. No delusions elicited during this assessment.   Patient reports to provider he wanted to discharge AMA because he was promised Ativan at "[facility] ED", which he has not received.  Patient asked why he needed ativan, he reports " I take ativan at home, and they told me at "[facility] ED" that I would get ativan here whenever I wanted, but they not giving it to me like they promised, so I wanna leave, because there's no point of being here".  Patient reports he has a prescription for ativan at home, and he would like to go home and continue taking this medication. Patient denies he is anxious and no other concerns were presented.  Patient provided with verbal encouragement to stay and continue treatment, but he declined.  Patient provided education on the benefits of staying versus risks of leaving without completing treatment. Patient acknowledged his understanding.  Patient encouraged to seek other psychiatric resources such as outpatient substance abuse  treatment, but he declined resources offered.   Discussed methods to reduce the risk of self-injury or suicide attempts: Frequent conversations regarding unsafe thoughts. Remove all significant sharps. Remove all firearms. Remove all medications, including over-the-counter meds. Consider lockbox for medications and having a responsible person dispense medications until patient has strengthened coping skills. Room checks for sharps or other harmful objects. Secure all chemical substances that can be ingested or inhaled.   Please refrain from using alcohol or illicit substances, as they can affect your mood and can cause depression, anxiety or other concerning symptoms.  Alcohol can increase the chance that a person will make reckless decisions, like attempting suicide, and can increase the lethality of a drug overdose.    Discussed crisis plan, calling 911 or going to the ED if condition changes or worsens. Patient verbalized his understanding.   At time of discharge, condition is stable and patient denies SI/HI/AVH and is able to contact for safety.

## 2022-06-14 NOTE — Discharge Instructions (Signed)

## 2022-06-14 NOTE — ED Notes (Signed)
NP Turkey at bedside to speak with pt.  Pt stating he wants to leave, be discharged if he can't have Ativan.  NP Turkey informed.

## 2022-06-15 LAB — HEMOGLOBIN A1C
Hgb A1c MFr Bld: 6.2 % — ABNORMAL HIGH (ref 4.8–5.6)
Mean Plasma Glucose: 131 mg/dL

## 2022-06-15 NOTE — BH Assessment (Addendum)
Anthony Wiggins presented to [facility] today with family. Pt family is encouraging patient to sign in for detox treatment but pt is stating that he does not want to sign in because he wants to come and go as he please. Mom reports that pt has nowhere to go; stating that he can not go home with her or other family members houses. TTS asked patient and family was housing an issues and patient stated yes he is having issues with finding housing. TTS provided patient with a list of shelters and other substance abuse resources. Pt states that he would rather go to a shelter and follow up with [facility]. TTS told pt that he can sign in to see the provider and then they can discuss treatment options. TTS explained to patient and family that this is a voluntary services and patient has to be willing to sign in and receive the treatment. TTS offered patient the opportunity for him to check in to be seen by the provider but he refused and decided that he would follow up with other resources. NP notified

## 2022-06-16 ENCOUNTER — Ambulatory Visit (HOSPITAL_COMMUNITY)
Admission: EM | Admit: 2022-06-16 | Discharge: 2022-06-16 | Disposition: A | Discharge status: Home / Self Care | Payer: Medicaid Other | Attending: Psychiatry | Admitting: Psychiatry

## 2022-06-16 DIAGNOSIS — F1510 Other stimulant abuse, uncomplicated: Secondary | ICD-10-CM | POA: Insufficient documentation

## 2022-06-16 DIAGNOSIS — F1410 Cocaine abuse, uncomplicated: Secondary | ICD-10-CM | POA: Insufficient documentation

## 2022-06-16 MED ORDER — OLANZAPINE 7.5 MG PO TABS
15.0000 mg | ORAL_TABLET | Freq: Every day | ORAL | Status: DC
  Filled 2022-06-16: qty 14

## 2022-06-16 MED ORDER — HYDROXYZINE HCL 25 MG PO TABS
25.0000 mg | ORAL_TABLET | Freq: Three times a day (TID) | ORAL | Status: DC
  Filled 2022-06-16: qty 12

## 2022-06-16 NOTE — ED Provider Notes (Signed)
Behavioral Health Urgent Care Medical Screening Exam  Patient Name: Anthony Wiggins MRN: [id number] Date of Evaluation: 06/16/22 Chief Complaint:   Diagnosis:  Final diagnoses:  Methamphetamine abuse (HCC)    Present illness: Anthony Wiggins is a 36 y.o. 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    Mom states that patient does not sleep at night and wakes up and talks to himself and tries to go out. Pt was seen recently for detox treatment but pt was not interested in rehab so he was discharged with resources and Zyprexa 15 mg daily at supper and hydroxyzine 25 mg Q8h..  Patient denies SI, HI and AVH and doesn't appear psychotic. He is not responding to internal/external stimuli. He reports that he gets nightmares at night and after nightmares he hears certain noises and sees faces. He reports that he had been using ICE (methamphetamine) but has not used it in 7 days. He thinks that his ICE was laced with fentanyl. Discussed that his last urine drug screen didn't show fantanyl but only benzodiazepines. He reports  that he was prescribed Zyprexa and Hydroxyzine but he was not able to afford it so hasn't started it. He reports poor sleep. He denies any depression or manic symptoms at this time other than decreased need for sleep.  He is not interested in Rehab placement at this time. Mom encouraged him to go to Rehab but patient decline.  He denies past SA and reports 1 past psychiatric admission and multiple ED visits. Pt states he is currently living with his Girlfriend but she doesn't allow him to sleep at her place because of his sleep issues. He reports that he may have to go to shelter. Pt doesn't meet criteria for psychiatric inpatient. He'll be discharged with rehab resources and 7 days samples of Zyprexa and Hydroxyzine. He states he will call rehab places after  holiday.  Flowsheet Row ED from [facility]20[facility] from 06/13/2022 [facility] from 06/12/2022 [facility]  C-SSRS RISK CATEGORY No Risk No Risk No Risk       Psychiatric Specialty Exam  Presentation  General Appearance:Casual  Eye Contact:Fair  Speech:Clear and Coherent; Normal Rate  Speech Volume:Normal  Handedness:Right   Mood and Affect  Mood: Euthymic  Affect: Full Range   Thought Process  Thought Processes: Linear; Coherent  Descriptions of Associations:Circumstantial  Orientation:Full (Time, Place and Person)  Thought Content:Logical  Diagnosis of Schizophrenia or Schizoaffective disorder in past: Yes   Hallucinations:None  Ideas of Reference:None  Suicidal Thoughts:No  Homicidal Thoughts:No   Sensorium  Memory: Immediate Fair; Recent Fair  Judgment: Poor  Insight: Shallow   Executive Functions  Concentration: Fair  Attention Span: Fair  Recall: [employer]  Fund of Knowledge: Fair  Language: Fair   Psychomotor Activity  Psychomotor Activity: Normal   Assets  Assets: Manufacturing systems engineerCommunication Skills; Desire for Improvement; Financial Resources/Insurance; Physical Health; Social Support   Sleep  Sleep: Fair  Number of hours:  4   No data recorded  Physical Exam: Physical Exam Vitals reviewed.  HENT:     Head: Normocephalic and atraumatic.  Pulmonary:     Effort: Pulmonary effort is normal.  Neurological:     General: No focal deficit present.     Mental Status: He is oriented to person, place, and time.  Review of Systems  Constitutional:  Negative for chills and fever.  Respiratory:  Negative for cough.   Cardiovascular:  Negative for chest pain.  Gastrointestinal:  Negative for abdominal pain, nausea and vomiting.  Neurological:  Negative for dizziness and headaches.  Psychiatric/Behavioral:   Negative for depression, hallucinations and suicidal ideas.    Blood pressure 125/78, pulse 94, temperature 98.7 F (37.1 C), temperature source Oral, resp. rate 18, SpO2 100 %. There is no height or weight on file to calculate BMI.  Musculoskeletal: Strength & Muscle Tone: within normal limits Gait & Station: normal Patient leans: N/A   [facility] MSE Discharge Disposition for Follow up and Recommendations: Anthony Wiggins is a 36 y.o. 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   Based on my evaluation the patient does not appear to have an emergency medical condition and can be discharged with resources and follow up care in outpatient services for Medication Management Pt doesn't meet criteria for psychiatric inpatient. He'll be discharged with resources and 7 days samples of Zyprexa and Hydroxyzine.  Patient is instructed prior to discharge to: Take all medications as prescribed by his mental healthcare provider. Report any adverse effects and or reactions from the medicines to his outpatient provider promptly. Patient has been instructed & cautioned: To not engage in alcohol and or illegal drug use while on prescription medicines. In the event of worsening symptoms, patient is instructed to call the crisis hotline, 911 and or go to the nearest ED for appropriate evaluation and treatment of symptoms. To follow-up with his/her primary care provider for your other medical issues, concerns and or health care needs.    Substance Abuse Treatment Programs  Intensive Outpatient Programs [facility]     [address]      [place], [place]                   [phone]       [facility] [address] [place] [place] [address] [place], [place] [phone]  [facility]     (Inpatient and outpatient)     [address]           [phone]    [facility] [phone] (Suboxone and Methadone)  [address]      [place], [place] [address]      [phone]       [address] [place], [place] [phone]  [facility] (Outpatient/Inpatient, Chemical)    (insurance only) [phone]             [facility] (Groups & Residential) [place], [place] [phone]     [facility]     [address]     [place], [place]      [phone]       [facility] (for caregivers and family) [phone] [address] [place], [place] [phone]      Residential Treatment Programs [facility]      [address] [place], [place] [address]  [phone]       [facility] [address] [place], [place] [address] [phone]  [facility] [place]        [phone]       [facility] [phone]  [facility] (Add[facility]             [address]  [address]                                         [place], [place]                                                [phone] or [phone]                               [facility] [address] [place], [address] [phone]  [facility]    [address]      [place], [place]     [phone]       [facility] [address] [place], [place] [phone]  [facility]   [address] [place], [place] [address]     [phone]      Admissions: 8am-3pm M-F  [facility] (RTS) [address] [place], [place] [phone]  [facility]: Residential Program [phone] Days)   [place], [place]      [phone] or [phone]     [facility]C: [facility] [place], [place] (Walk in Hours over the weekend or by referral)  [facility] [address] [place], [place], [place] [address] [phone]  Crisis Mobile: Therapeutic  Alternatives:  [phone] (for crisis response 24 hours a day) [facility] Hotline:      [phone] Outpatient Psychiatry and Counseling  [facility]: Mobile Crisis Management 24 hours:  [phone]  [facility] of the [employer] sliding scale fee and walk in schedule: M-F 8am-12pm/1pm-3pm [address]  [place], [place] [address] [phone]  [facility] [address] [place], [place] [address] [phone]  [facility] (Formerly known as [facility] [employer])- new patient walk-in appointments available Monday - Friday 8am -3pm.          [address] [place], [place] [address] [phone] or crisis line- [phone]  [facility] Services/ Intensive Outpatient Therapy Program [address] [place], [place] [address] [phone]  [facility]                  Crisis Services      [phone] [address]     [place], [place] [address]                 [facility]   [facility] [phone]. [address] [place], [place] [address]   [facility]          [address]  [place], [place] [address]       [phone]  [facility] [address] [place], [place] [address] [phone]  [facility]    [address]    [place], [place] [address]     [phone]       Andee Poles, MD     [address]     [place] [address]     [phone]       [facility] [address] [place] [address]  [facility]     [address] [place], [place]      [phone]       [facility] Eulogio Ditch, MD [address] [place], [place] [address] [phone]  [facility]     [address]     [place], [place]  [address]     [phone]       [facility] [address] [place], [place] [address] [phone] Resources for Temporary Residential Assistance/Crisis Centers  [facility]) M-F 8am-3pm   [address] [place], [place] [address]   [phone] Services include: laundry, barbering, support groups, case management, phone  & computer access, showers, AA/NA mtgs, mental health/substance abuse nurse, job skills class, disability information, VA assistance, spiritual classes, etc.   HOMELESS SHELTERS  [facility]     [facility]   [address] [place]     [phone]              [employer] (women and children)       [address] [place], [place] [address] [phone] [email or web address] for application and process Application Required  [facility]   [address]    [place] [address]     [phone]                    [facility] [place] [address] [place], [place] [address] [phone] application appt.) Application Required  [facility] (women only)    [address]     [place], [place] [address]     [phone]      Intake starts 6pm daily Need valid ID, SSC, & Police report [employer] [address] [place], [place] [phone] Application Required  [employer] (men only)     [address]      [place], [place]     [phone]       [facility] [place] (Pregnant women only) [address] [place], [place] [phone]  [facility]      [address]      [place], [place] [address]     [phone]             [facility] [address] [place], [place] [phone] 90 day commitment/SA/Application process  [facility](men only)     [address]     [place],  [place]     [phone]       Check-in at [facility] [address] [place], [place] [address] [phone] Men/Women/Women and Children must be there by 7 pm  [facility] [place], [place] [phone]                 Karsten Ro, MD PGY3 06/16/2022, 2:02 PM

## 2022-06-16 NOTE — ED Notes (Signed)
Patient A&O x 4, ambulatory. Patient discharged in no acute distress with family. Patient denied SI/HI, A/VH upon discharge. Patient verbalized understanding of all discharge instructions explained by staff, to include follow up appointments, RX's and safety plan. Patient reported mood 10/10.  Pt belongings returned to patient from black locker  intact. Patient escorted to lobby via staff for transport to destination. Safety maintained.

## 2022-06-16 NOTE — Discharge Instructions (Addendum)
Patient is instructed prior to discharge to: Take all medications as prescribed by his mental healthcare provider. Report any adverse effects and or reactions from the medicines to his outpatient provider promptly. Patient has been instructed & cautioned: To not engage in alcohol and or illegal drug use while on prescription medicines. In the event of worsening symptoms, patient is instructed to call the crisis hotline, 911 and or go to the nearest ED for appropriate evaluation and treatment of symptoms. To follow-up with his/her primary care provider for your other medical issues, concerns and or health care needs.   Substance Abuse Treatment Programs  Intensive Outpatient Programs [facility]     [address]      [place], [place]                   [phone]       [facility] [address] [place], [place] [phone]  [facility]     (Inpatient and outpatient)     [address]           [phone]    [facility] [phone] (Suboxone and Methadone)  [address]      [place], [place] [address]      [phone]       [address] [place], [place] [phone]  [facility] (Outpatient/Inpatient, [employer])    (insurance only) [phone]             [facility] (Groups & Residential) [place], [place] [phone]     [facility]     [address]     [place], [place]      [phone]       [facility] (for caregivers and family) [phone] [address] Laurell Josephs. [address] [place], [place] [phone]      Residential Treatment Programs [facility]      [address] [place], [place] [address]  [phone]       [facility] [address] [place], [place] [address] [phone]  [facility] [place]        [phone]       [facility] [phone]  [facility] ([facility]             [address]                                          [place], [place]                                                [phone] or [phone]                               [facility] [address] [place], [address] [phone]  [facility]    [address]      [place], [place]     [phone]       [facility] [address] [place], [place] [phone]  [facility]   [address] [place], [place] [address]     [phone]  Admissions: 8am-3pm M-F  [facility] ([facility]) [address] [place], [place] [phone]  [facility]: Residential Program ([address])   [place], [place]      [phone] or [phone]     [facility]: [facility] [place], [place] (Walk in Hours over the weekend or by referral)  [facility] [address] [place], [place], [place] [address] [phone]  Crisis Mobile: [facility]:  [phone] (for crisis response 24 hours a day) [facility] Hotline:      [phone] Outpatient Psychiatry and Counseling  [facility]: Mobile Crisis Management 24 hours:  [phone]  [facility] [employer] sliding scale fee and walk in schedule: M-F 8am-12pm/1pm-3pm [address]  [place], [place] [address] [phone]  [facility] [address] [place], [place] [address] [phone]  [facility] ([facility] [employer])- new patient walk-in appointments available Monday - Friday 8am -3pm.          [address] [place], [place] [address] [phone] or crisis line- [phone]  [facility] Out[facility] Services/ Intensive Outpatient Therapy Program [address] [place], [place] [address] [phone]  [facility]                  Crisis Services      [phone] [address]     [address] [place], [place]  [address]                 [facility]   [facility] [phone]. [address] [place], [place] [address]   [facility]          [address]  [place], [place] [address]       [phone]  [facility] [address] [place], [place] [address] [phone]  [facility]    [address]    [place], [place] [address]     [phone]       Andee Poles, MD     [address]     [place] [address]     [phone]       [facility] [address] [place] [address]  [facility]     [address] [place], [place]      [phone]       [facility] Eulogio Ditch, MD [address] [place], [place] [address] [phone]  [facility]     [address]     [place], [place] [address]     [phone]       [facility] [address] [place], [place] [address] [phone] Resources for Temporary Residential Assistance/Crisis Centers  [facility]ENT[facility]) M-F 8am-3pm   [address] [place], [place] [address]   [phone] Services include: laundry, barbering, support groups, case management, phone  & computer access, showers, AA/NA mtgs, mental health/substance abuse nurse, job skills class, disability information, VA assistance, spiritual classes, etc.   HOMELESS SHELTERS  [facility]     [facility]   [address], [place]     [phone]              [employer] (women and children)       [address] [place], [place]  [address] [phone] [email or web address] for application and process Application Required  [facility] [employer] [facility]   [address]    [place] [address]     [phone]                    [facility] [address]  [address] [place], [place] [address] [phone] application appt.) Application Required  [facility] (women only)    [address]     [place], [place] [address]     [phone]      Intake starts 6pm daily Need valid ID, SSC, & Police report [employer] [address] [place], [place] [phone] Application Required  [employer] (men only)     [address]      [place], [place]     [phone]       [facility] (Pregnant women only) [address] [place], [place] [phone]  [facility]      [address]      [place], [place] [address]     [phone]             [facility] [address] [place], [place] [phone] 90 day commitment/SA/Application process  [facility](men only)     [address]     [place], [place]     [phone]       Check-in at [facility] [address] [place], [place] [address] [phone] Men/Women/Women and Children must be there by 7 pm  [facility] [place], [place] [phone]

## 2022-06-19 ENCOUNTER — Ambulatory Visit (HOSPITAL_COMMUNITY)
Admission: EM | Admit: 2022-06-19 | Discharge: 2022-06-19 | Disposition: A | Discharge status: Home / Self Care | Payer: Medicaid Other | Attending: Psychiatry | Admitting: Psychiatry

## 2022-06-19 DIAGNOSIS — R440 Auditory hallucinations: Secondary | ICD-10-CM | POA: Insufficient documentation

## 2022-06-19 DIAGNOSIS — Z5901 Sheltered homelessness: Secondary | ICD-10-CM | POA: Insufficient documentation

## 2022-06-19 DIAGNOSIS — Z91148 Patient's other noncompliance with medication regimen for other reason: Secondary | ICD-10-CM | POA: Insufficient documentation

## 2022-06-19 NOTE — Discharge Instructions (Signed)
Substance Abuse Treatment Programs ° °Intensive Outpatient Programs °[facility]     °[address]      °[place], [place]                   °336-878-6098      ° °[facility] °[address] °[place], [place] °336-379-7146 ° °[facility]     °(Inpatient and outpatient)     °[address]           °336-832-9800   ° °[facility] °336-288-1484 (Suboxone and Methadone) ° °[address]      °[place], [place] [address]      °336-882-2125      ° °[address] °[place], [place] °852-3033 ° °[facility] (Outpatient/Inpatient, Chemical)    °(insurance only) 336-621-3381      °       °[facility] (Groups & Residential) °[place], [place] °336-389-1413 ° °   °[facility]     °[address]     °[place], [place]      °336-389-1413      ° °[facility] (for caregivers and family) °[address] °[place], [place] °336-299-4655 ° ° ° ° ° °Residential Treatment Programs °[facility]      °[address], [place], [place] [address]  °(336) [phone]      ° °[facility] °[address] [place], [place] [address] °919-419-1059 ° °[facility]        °336-248-8914      ° °[facility] °1-800-659-3381 ° °[facility]             °[address]                                         °[place], [place]                                                °877-615-2722 or 336-784-9470                              ° °[facility] [place] °[address] °[place], [address] °1.877.941.8954 ° °[facility]    °[address]      °[place], [place]     °336-273-5306      ° °[facility] °[address] °[place], [place] °336-285-9073 ° °[facility]   °[address]     °[place], [place] [address]     °336-899-1550      °Admissions: 8am-3pm M-F ° °[facility] ([facility]) °[address] °[place],  [place] °336-227-7417 ° °[facility]: Residential Program (90 Days)   °[place], [place]      °336-725-8389 or 800-758-6077    ° °[facility]: [facility] °[place], [place] °(Walk in Hours over the weekend or by referral) ° °[facility] °[address], [place],  [address] °(336) 723-1848 ° °Crisis Mobile: [facility]:  1-877-626-1772 (for crisis response 24 hours a day) °[facility] Hotline:      1-800-256-2452 °Outpatient Psychiatry and Counseling ° °[facility]: Mobile Crisis   Management 24 hours:  1-877-626-1772 ° °[facility] sliding scale fee and walk in schedule: M-F 8am-12pm/1pm-3pm °[address]  °[place], [place] [address] °336-387-6161 ° °[facility] °[address] °[place], [place] [address] °336-703-9650 ° °[facility] ([facility])- new patient walk-in appointments available Monday - Friday 8am -3pm.          °[address] °[place], [place] [address] °336-676-6840 or crisis line- 336-676-6905 ° °[facility]/ Intensive Outpatient Therapy Program °[address] °[place], [place] [address] °336-832-9804 ° °[facility]                  °Crisis Services      °336.641.4993      °[address]     °[place], [place] [address]                ° °[facility]   °[facility] °800.525.9375 °[address] °[place], [place] [address] ° ° °[facility]          °[address],  °[place], [place] [address]       °(336) 271-5888 ° °[facility] °[address] °[place], [place] [address] °336-292-1510 ° °[facility]    °[address]    °[place], [place] [address]     °336-632-3505      ° °Parish McKinney, MD     °[address]     °[place] [address]     °336-282-1251     °  °[facility] °[address] °[place] [address] ° °[facility]     °[address]     °[place], [place]      °336-542-2076      ° °[facility] °Shamsher Ahluwalia, MD °[address] °[place], [place] [address] °336-420-9558 ° °[facility]     °[address]     °[place], [place] [address]     °336-274-1237      ° °[facility] °[address] °[place], [place] [address] °336-854-4450 °Resources for Temporary Residential Assistance/Crisis Centers ° °DAY CENTERS °[facility]) °M-F 8am-3pm   °[address] [place], [place] [address]   336-332-0824 °Services include: laundry, barbering, support groups, case management, phone  & computer access, showers, AA/NA mtgs, mental health/substance abuse nurse, job skills class, disability information, VA assistance, spiritual classes, etc.  ° °HOMELESS SHELTERS ° °[facility]     °[facility]   °[address], [place]     °336.271.5959       °       °[facility] (women and children)       °[address] °[place], [place] [address] °336-275-0820 °[email or web address] for application and process °Application Required ° °[facility]   °[address]    °[place] [address]     °336.886.4922       °             °[facility] °[address] °,  [address] °336.273.5572 °336-235-0363(schedule application appt.) °Application Required ° °[facility] (women only)    °[address]     °[place],  [address]     °336-884-1039      °  Intake starts 6pm daily Need valid ID, SSC, & Police report [facility] [place] [address] [place], [place] [phone] Application Required  [facility] (men only)     [address]      [place], [place]     [phone]       [facility] (Pregnant women only) [address] [place], [place] [phone]  [facility]      [address]      [place], [place] [address]     [phone]             [facility] [address] [place], [place] [phone] 90 day commitment/SA/Application process  [facility](men only)     [address]     [place], [place]     [phone]       Check-in at [facility] [place]            [facility] [address] [place], [place] [phone] Men/Women/Women and Children must be there by 7 pm  [facility] [place], [place] [phone]                  Substance Abuse Treatment Programs  Intensive Outpatient Programs [facility]     [address]      [place]                   [phone]       [facility] [address] [place], [place] [phone]  [facility]     (Inpatient and outpatient)     [address]           [phone]    [facility] [phone] (Suboxone and Methadone)  [address]      [place], [place] [address]      [phone]       [address] [place], [place] [phone]  [facility] (Outpatient/Inpatient, Chemical)    (insurance only) [phone]             [facility] (Groups & Residential) [place], [place] [phone]     [facility]     [address]     [place], [place]      [phone]       [facility] (for caregivers and family) [phone] [place], [place] [phone]      Residential Treatment Programs [facility]      [address], [place], [place] [address]  [phone]       [facility] [address] [place], [place] [address] [phone]  [facility]        [phone]       [facility] [phone]  [facility]             [address]                                         [place], [place]  [phone] or [phone]                               [facility] [place] [address] [place], [address] [phone]  [facility]    [address]      [place], [place]     [phone]       [facility] [address] [place], [place] [phone]  [facility]   [address] [place], [place] [address]     [phone]      Admissions: 8am-3pm M-F  [facility] ([facility]) [address] [place], [place] [phone]  [facility]: Residential Program [phone] Days)   [place], [place]      [phone] or [phone]     [facility]: [facility] [place], [place] (Walk in Hours over the weekend or by referral)  [facility] [address] [place], [place], [place] [address] [phone]  Crisis Mobile: [facility]:  [phone] (for crisis response 24 hours a day) [facility] Hotline:      340-444-8712 Outpatient Psychiatry and Counseling  [facility]: Mobile Crisis Management 24 hours:  [phone]  [facility] [employer] sliding scale fee and walk in schedule: M-F 8am-12pm/1pm-3pm [address]  [place], [place] [address] [phone]  [facility] [address] [place], [place] [address] [phone]  [facility] ([facility])- new patient walk-in appointments available Monday - Friday 8am -3pm.          [address] [place], [place] [address] [phone] or crisis line- [phone]  [facility] Services/ Intensive Outpatient Therapy Program [address] [place], [place] [address] [phone]  [facility]                  Crisis Services      [phone]     [place], [place] [address]                 [facility]   [facility] [phone]. [address] [place], [place] [address]   [facility]          [address]  [place], [place] [address]       [phone]  [facility] [address] [place], [place] [address] [phone]  [facility]    [address]    [place], [place] [address]     [phone]       Andee Poles, MD     [address]     [place] [address]     [phone]       [facility] [address] [place] [address]  [facility]     [address] [place], [place]      [phone]       [facility] Eulogio Ditch, MD [address] [place], [place] [address] [phone]  [facility]     [address]     [place], [place] [address]     [phone]       [facility] [address] [place], [place] [address] [phone] Resources for Temporary  Residential Assistance/Crisis Theme park manager [facility]) M-F 8am-3pm   [address] [place], [place] [address]   [phone] Services include: laundry, barbering, support groups, case management, phone  & computer access, showers, AA/NA mtgs, mental health/substance abuse nurse, job skills class, disability information, VA assistance, spiritual classes, etc.   HOMELESS SHELTERS  [facility]     [facility]   [address] [place]     [phone]              [employer] (women and children)       [address] [place], [place] [address] [phone] [email or web address] for application and process Application Required  [facility]   [address]    [place] [address]     [phone]                    [facility] [address] [place], [place] [address] [phone]  application appt.) Application Required  [facility] (women only)    [address]     [place], [place] [address]     [phone]      Intake starts 6pm daily Need valid ID, SSC, & Police report [employer] [address] [place], [place] [phone] Application Required  [facility] (men only)     [address]      [place], [place]     [phone]       [facility] (Pregnant women only) [address] [place], [place] [phone]  [facility]      [address]      [place], [place] [address]     [phone]             [facility] [address] [place], [place] [phone] 90 day commitment/SA/Application process  [facility](men only)     [address]     [place], [place]     [phone]       Check-in at [facility] [address] [place], [place] [address] [phone] Men/Women/Women and Children must be there by 7 pm  [facility] [place], [place] [phone]                 [facility] Address: [address] [place], [place], [place] [address] Phone: [phone]  Supported Employment The supported employment program is a person-centered, individualized, evidence-based support service that helps members choose, acquire, and maintain competitive employment in our community. This service supports the varying needs of individuals and promotes community inclusion and employment success. Members enrolled in the supported employment program can expect the following:  Development of an individual career plan Community based job placement Job shadowing Job development On-site job Furniture conservator/restorer and support  Supported Education Supported education helps our members receive the education and training they need to achieve their learning and recovery goals. This will assist members with becoming  gainfully employed in the job or career of their choice. The program includes assistance with: Registering for disability accommodations Enrolling in school and registering for classes Learning communication skills Scheduling tutoring sessions within your school [facility] partners with Vocational Rehabilitation to help increase the success of clients seeking employment and educational goals.  Want  to learn more about our programs?   Please contact our intake department INTAKE: [phone] Ext [phone]  Mailing: [address] [address]   [place], [place] [address]   [email or web address]        [facility]  Hours Monday - Friday: Services: 8:00AM - 3:00PM Offices: 8:00AM - 5:00PM  Physical Address [address] [place], [place] [address]   Please use this address for [facility] Mailing Address [address] [address] [place], [place] [address]  The [facility] helps people reconnect This is a safe place to rest, take care of basic needs and access the services and community that make all the difference. Our guests come to the [facility] to take a class, do laundry, meet with a case manager or to get their mail. Sometimes they just need to sit in our dayroom and enjoy a conversation.  Here you will find everything from shower facilities to a computer lab, a mail room, classrooms and meeting spaces.  The [facility] helps people reconnect with their own lives and with the community at large.  A caring community setting One of the most exciting aspects of the IRC is that so many individuals and organizations in the community are a part of the everyday experience. Whether it's a hair stylist or law firm offering services right in-house, our partners make the [facility] a truly interactive resource center where services are brought to our guests. The IRC brings together a comprehensive community of talented people who not only want to help solve problems, but also to be a part of our guests' lives.  Integrated Care We  take a person-centered approach to assistance that includes: Case management Geneticist, molecular Medical clinic Mental health nurse Referrals  Fundamental Services We start with necessities: Midwife and Armed forces operational officer addresses and mailboxes Replacement IDs Onsite barbershop Storage lockers [place] winter warming center  Self-Sufficiency We connect our guests with: Skilled trade classes Job skills classes Resume and jobs application assistance Interview training GED Academic librarian

## 2022-06-19 NOTE — BH Assessment (Signed)
Anthony Wiggins, Routine; presents this date with his mother, [name]. Pt denies SI, and HI.  Pt reports paranoia episodes, "I feel that someone is watching me". Pt also, reports nightmares and hearing voices. Pt presents tired, and falling asleep during assessment. Pt reports a diagnose of schizophrenia; also, reports taking prescribed medication for symptom management. Pt signed MSE.

## 2022-06-19 NOTE — ED Provider Notes (Signed)
Behavioral Health Urgent Care Medical Screening Exam  Patient Name: Anthony Wiggins MRN: [id number] Date of Evaluation: 06/19/22 Chief Complaint:   Diagnosis:  Final diagnoses:  None   History of Present illness: Anthony Wiggins is a 36 y.o. 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  Patient reports he has been experiencing auditory and visual hallucination an hour prior to going to sleep and approximately and hour after waking up. He describes feeling bothered by these symptoms,  but denies experiencing them during the day. Per patient's mother, patient has been screaming and arguing with her and family members, threatening to hurt them. Patient denies this and says it was a misunderstanding, he denies any homicidal ideation.  When discussing his most recent visit on (12/123/2023) 3 days ago, he reports having received the 7 day supply of Zyprexa, which he says he does not have anymore. Patient's mother suspects he has taken them all or has thrown them away. Patient denies this but does not clarify what he has done with the medications and states he has been adherent. He denies any recent drug use, only endorses taking a bottle of beer since.   On assessment patient denies suicidal ideation. He denies homicidal ideation. He denies auditory and visual hallucinations. He denies paranoid ideations, there are no apparent paranoid thought processes. He denies thought insertion, thought withdrawal, and ideas of reference.   Patient was offered rehabilitation places, declines, states he does not wish to be in a facility for 30 days. Says "I just want to be somewhere I can go to sleep and then go do work and see my family, and I don't want it to be a homeless shelter". Patient was oriented and explained that this type of facility was not  available. Was encouraged to follow through on his outpatient follow up appointments.  Patient declines further resources and wishes to leave.   [place] [facility] from [facility]20[facility] from 06/13/2022 in [facility] ED from 06/12/2022 in [place] No Risk No Risk No Risk       Psychiatric Specialty Exam  Presentation  General Appearance:Casual  Eye Contact:Fair  Speech:Clear and Coherent; Normal Rate  Speech Volume:Normal  Handedness:Right   Mood and Affect  Mood: Euthymic  Affect: Full Range   Thought Process  Thought Processes: Linear; Coherent  Descriptions of Associations:Circumstantial  Orientation:Full (Time, Place and Person)  Thought Content:Logical  Diagnosis of Schizophrenia or Schizoaffective disorder in past: Yes   Hallucinations:None  Ideas of Reference:None  Suicidal Thoughts:No  Homicidal Thoughts:No   Sensorium  Memory: Immediate Fair; Recent Fair  Judgment: Poor  Insight: Shallow   Executive Functions  Concentration: Fair  Attention Span: Fair  Recall: [employer] of Knowledge: Fair  Language: Fair   Psychomotor Activity  Psychomotor Activity: Normal   Assets  Assets: Armed forces logistics/support/administrative officer; Desire for Improvement; Financial Resources/Insurance; Physical Health; Social Support   Sleep  Sleep: Fair  Number of hours:  4   No data recorded  Physical Exam: Physical Exam Constitutional:      General: He is not in acute distress.    Appearance: Normal appearance. He is not ill-appearing.  Pulmonary:     Effort: Pulmonary effort is normal. No respiratory distress.  Musculoskeletal:        General:  Normal range of motion.  Skin:    General: Skin is warm and dry.  Neurological:     Mental Status: He is alert.  Psychiatric:        Mood and Affect: Mood normal.         Behavior: Behavior normal.        Thought Content: Thought content normal.        Judgment: Judgment normal.    Review of Systems  Respiratory:  Negative for shortness of breath.   Cardiovascular:  Negative for chest pain.  Gastrointestinal:  Negative for abdominal pain.  Psychiatric/Behavioral:  Negative for depression, hallucinations, memory loss, substance abuse and suicidal ideas. The patient is not nervous/anxious and does not have insomnia.    Blood pressure (!) 148/79, pulse (!) 105, temperature 98.1 F (36.7 C), temperature source Oral, resp. rate 18, SpO2 98 %. There is no height or weight on file to calculate BMI.  Musculoskeletal: Strength & Muscle Tone: within normal limits Gait & Station: normal Patient leans: N/A   [facility] MSE Discharge Disposition for Follow up and Recommendations: Based on my evaluation the patient does not appear to have an emergency medical condition and can be discharged with resources and follow up care in outpatient services for rehabilitation places   Lorri Frederick, MD 06/19/2022, 2:40 PM

## 2022-06-20 ENCOUNTER — Ambulatory Visit (HOSPITAL_COMMUNITY)
Admission: EM | Admit: 2022-06-20 | Discharge: 2022-06-20 | Disposition: A | Discharge status: Home / Self Care | Payer: Medicaid Other | Attending: Registered Nurse | Admitting: Registered Nurse

## 2022-06-20 ENCOUNTER — Other Ambulatory Visit (HOSPITAL_COMMUNITY): Payer: Self-pay | Admitting: Registered Nurse

## 2022-06-20 ENCOUNTER — Encounter (HOSPITAL_COMMUNITY): Payer: Self-pay | Admitting: Registered Nurse

## 2022-06-20 MED ORDER — OLANZAPINE 15 MG PO TABS
15.0000 mg | ORAL_TABLET | Freq: Every day | ORAL | 0 refills | Status: DC
  Filled 2022-06-21: qty 30, 30d supply, fill #0

## 2022-06-20 MED ORDER — HYDROXYZINE HCL 25 MG PO TABS
25.0000 mg | ORAL_TABLET | Freq: Four times a day (QID) | ORAL | 0 refills | Status: DC
  Filled 2022-06-21: qty 60, 15d supply, fill #0

## 2022-06-20 MED ORDER — OLANZAPINE 15 MG PO TABS: 15.0000 mg | ORAL_TABLET | Freq: Every day | ORAL | 0 refills | Status: DC

## 2022-06-20 MED ORDER — HYDROXYZINE HCL 25 MG PO TABS: 25.0000 mg | ORAL_TABLET | Freq: Four times a day (QID) | ORAL | 0 refills | Status: DC

## 2022-06-20 NOTE — Discharge Instructions (Addendum)
[facility] Address: [address] [place], [place], [place] [address] Phone: [phone]  Supported Employment The supported employment program is a person-centered, individualized, evidence-based support service that helps members choose, acquire, and maintain competitive employment in our community. This service supports the varying needs of individuals and promotes community inclusion and employment success. Members enrolled in the supported employment program can expect the following:  Development of an individual career plan Community based job placement Job shadowing Job development On-site job Furniture conservator/restorer and support  Supported Education Supported education helps our members receive the education and training they need to achieve their learning and recovery goals. This will assist members with becoming gainfully employed in the job or career of their choice. The program includes assistance with: Registering for disability accommodations Enrolling in school and registering for classes Learning communication skills Scheduling tutoring sessions within your school [facility] partners with [employer] to help increase the success of clients seeking employment and educational goals.  Want to learn more about our programs?   Please contact our intake department INTAKE: [phone] Ext [phone]  Mailing: [address] [address]   [place], [place] [address]   [email or web address]

## 2022-06-20 NOTE — Progress Notes (Signed)
Patient is returning to the [facility] this date requesting his medical records as well as for a provider to call [facility] to "authorize" his prescription to be filled. Patient has a history cocaine and methamphetamine use and drug induced psychosis. Patient denies that he has used these drugs since he was last seen here, but does state that he drank half of a beer today. Patient denies surrent SI/HI/Psychosis. His mother is present with him and states, "we just came here to get a provider to call [facility] to authorize his medications to be filled so he can go to [facility] tomorrow." Mother states, "He has been assessed, we just need his medications."

## 2022-06-20 NOTE — ED Notes (Signed)
Patient accidentally put in system. Patient did not want to be seen. Patient was given resources By Dell Ponto, NP.

## 2022-06-21 ENCOUNTER — Other Ambulatory Visit: Payer: Self-pay

## 2022-07-01 ENCOUNTER — Ambulatory Visit (HOSPITAL_COMMUNITY)
Admission: EM | Admit: 2022-07-01 | Discharge: 2022-07-01 | Disposition: A | Discharge status: Home / Self Care | Payer: 59

## 2022-07-01 DIAGNOSIS — Z91148 Patient's other noncompliance with medication regimen for other reason: Secondary | ICD-10-CM

## 2022-07-01 DIAGNOSIS — Z638 Other specified problems related to primary support group: Secondary | ICD-10-CM

## 2022-07-01 NOTE — ED Provider Notes (Incomplete)
Behavioral Health Urgent Care Medical Screening Exam  Patient Name: Anthony Wiggins MRN: [id number] Date of Evaluation: 07/01/22 Chief Complaint:   Diagnosis:  Final diagnoses:  Noncompliance with medication regimen    History of Present illness: Anthony Wiggins is a 37 y.o. male with history of polysubstance abuse (methamphetamine, cocaine, THC).  Patient was accompanied to [facility] voluntarily by his mother, Katrine Coho [phone]) for a walk-in assessment.  Patient was evaluated face-to-face and his chart was reviewed by this nurse practitioner. On evaluation, he is alert and oriented x 4; he is calm and cooperative. His speech is clear, coherent, normal rate and volume.  His mood is euthymic with a congruent affect.  No signs of psychosis, preoccupation, distractibility, or delusional thought content noted during assessment.  Patient reports that he has a history of "schizophrenia and drug abuse."  He says he was recently started on Zyprexa 15 mg per night and hydroxyzine 25 mg 4 times a day as needed.  He says that he is compliant with his medication regimen he denies current psychiatric concerns; he says he came to [facility] for evaluation and follow at the request of his mother tonight. Patient states "she wanted me to come get evaluated, and I wanted her to be comfortable and not worried about me."  He denies suicidal ideation or history of suicidal attempt.  He denies homicidal ideation, auditory and visual hallucinations, paranoia.  He acknowledges history of substance use, he says he last used methamphetamine on 06/12/2022. he says he is currently sober and denies all other illicit substance use. He says he drank 1/2 can of beer 48 hours ago.   Patient's mother says she was contacted by patient's girlfriend due to patient screaming and throwing things on the wall. She reports that she is unsure if patient is taking his medication. She says she wants patient to be "tested drugs."  Patient  denies screaming, he says he had a nightmare while taking a nap and woke up. He denies throwing things and said it was his girlfriend that spilled juice on the floor. He denies altercation with girlfriend or others. He says he is sleeping 6-8 hours at night. He adamantly denies recent illicit substance use.   No evidence of imminent danger to self or others at this time. Patient does not meet criteria for psychiatric admission or IVC. Supportive therapy provided about ongoing stressors. Discussed crisis plan, callling 911/988 or going to Emergency Dept   [place] ED from 07/01/2022 in [facility] ED from 06/14/2022 in [facility] ED from 06/13/2022 in [place] No Risk No Risk No Risk       Psychiatric Specialty Exam  Presentation  General Appearance:Appropriate for Environment  Eye Contact:Good  Speech:Clear and Coherent  Speech Volume:Normal  Handedness:Right   Mood and Affect  Mood: Euthymic  Affect: Congruent   Thought Process  Thought Processes: Coherent  Descriptions of Associations:Intact  Orientation:Full (Time, Place and Person)  Thought Content:Logical; WDL  Diagnosis of Schizophrenia or Schizoaffective disorder in past: Yes  Duration of Psychotic Symptoms: Greater than six months  Hallucinations:None  Ideas of Reference:None  Suicidal Thoughts:No  Homicidal Thoughts:No   Sensorium  Memory: Immediate Good; Recent Good; Remote Good  Judgment: Fair  Insight: Good   Executive Functions  Concentration: Good  Attention Span: Good  Recall: Good  Fund of Knowledge: Good  Language: Good   Psychomotor Activity  Psychomotor Activity: Normal  Assets  Assets: Manufacturing systems engineer; Desire for Improvement; Housing; Physical Health; Social Support; Transportation   Sleep  Sleep: Good  Number of hours:  8   No  data recorded  Physical Exam: Physical Exam ROS Blood pressure 104/77, pulse (!) 120, temperature 97.9 F (36.6 C), temperature source Oral, resp. rate 20, SpO2 96 %. There is no height or weight on file to calculate BMI.  Musculoskeletal: Strength & Muscle Tone: within normal limits Gait & Station: normal Patient leans: Right   [facility] MSE Discharge Disposition for Follow up and Recommendations: Based on my evaluation the patient does not appear to have an emergency medical condition and can be discharged with resources and follow up care in outpatient services for Medication Management, Individual Therapy, and Group Therapy  Patient says he is interested in outpatient group therapy here at [facility]. He requested bus pass so he can return. He was provided with 2 bus passes.   Maricela Bo, NP 07/01/2022, 11:38 PM

## 2022-07-01 NOTE — ED Provider Notes (Addendum)
Behavioral Health Urgent Care Medical Screening Exam  Patient Name: Anthony Wiggins MRN: [id number] Date of Evaluation: 07/02/22 Chief Complaint:   Diagnosis:  Final diagnoses:  Family discord  Non compliance w medication regimen    History of Present illness: Anthony Wiggins is a 37 y.o. male with history of polysubstance abuse (methamphetamine, cocaine, THC).  Patient was accompanied to [facility] voluntarily by his mother, Candace Cruise [phone]) for a walk-in assessment.  Patient was evaluated face-to-face and his chart was reviewed by this nurse practitioner. On evaluation, he is alert and oriented x 4; he is calm and cooperative. His speech is clear, coherent, normal rate and volume.  His mood is euthymic with a congruent affect.  No signs of psychosis, preoccupation, distractibility, or delusional thought content noted during assessment.  Patient reports that he has a history of "schizophrenia and drug abuse."  He says he was recently started on Zyprexa 15 mg per night and hydroxyzine 25 mg 4 times a day as needed.  He says that he is compliant with his medication regimen. He denies current psychiatric concerns; he says he came to [facility] tonight for an "evaluation and follow" at the request of his mother. Patient states "she wanted me to come get evaluated, and I wanted her to be comfortable and not worried about me."  He denies suicidal ideation or history of suicidal attempt.  He denies homicidal ideation, auditory and visual hallucinations, paranoia.  He acknowledges history of substance use, he says he last used methamphetamine on 06/12/2022. He says he is currently sober and denies all other illicit substance use. He says he drank 1/2 can of beer 48 hours ago.   Patient's mother says she was contacted by patient's girlfriend due to patient screaming and throwing things on the wall. She says she assist patient with managing his medication but she is unsure if patient is taking his medication.  She says she wants patient to be "tested drugs" to assure his sobriety.   Patient denies screaming at girlfriend, he says he had a nightmare while taking a nap and woke up. He denies throwing things and said it was his girlfriend that spilled juice on the floor. He denies altercation with girlfriend or others. He says he is sleeping 6-8 hours at night. He adamantly denies recent illicit substance use.   No evidence of imminent danger to self or others at this time. Patient does not meet criteria for psychiatric admission or IVC. Supportive therapy provided about ongoing stressors. Discussed crisis plan, callling 911/988 or going to Emergency Dept   Flowsheet Row ED from 07/01/2022 in [facility] ED from 06/14/2022 in [facility] ED from 06/13/2022 in [facility]  C-SSRS RISK CATEGORY No Risk No Risk No Risk       Psychiatric Specialty Exam  Presentation  General Appearance:Appropriate for Environment  Eye Contact:Good  Speech:Clear and Coherent  Speech Volume:Normal  Handedness:Right   Mood and Affect  Mood: Euthymic  Affect: Congruent   Thought Process  Thought Processes: Coherent  Descriptions of Associations:Intact  Orientation:Full (Time, Place and Person)  Thought Content:Logical; WDL  Diagnosis of Schizophrenia or Schizoaffective disorder in past: Yes  Duration of Psychotic Symptoms: Greater than six months  Hallucinations:None  Ideas of Reference:None  Suicidal Thoughts:No  Homicidal Thoughts:No   Sensorium  Memory: Immediate Good; Recent Good; Remote Good  Judgment: Fair  Insight: Good   Executive Functions  Concentration: Good  Attention Span: Good  Recall: Roel Cluck of Knowledge: Good  Language: Good   Psychomotor Activity  Psychomotor Activity: Normal   Assets  Assets: Communication Skills; Desire for Improvement; Housing; Physical  Health; Social Support; Transportation   Sleep  Sleep: Good  Number of hours:  8   No data recorded  Physical Exam: Physical Exam Vitals and nursing note reviewed.  Constitutional:      General: He is not in acute distress.    Appearance: He is well-developed.  HENT:     Head: Normocephalic and atraumatic.  Eyes:     Conjunctiva/sclera: Conjunctivae normal.  Cardiovascular:     Rate and Rhythm: Normal rate.     Heart sounds: No murmur heard. Pulmonary:     Effort: Pulmonary effort is normal. No respiratory distress.  Abdominal:     Palpations: Abdomen is soft.     Tenderness: There is no abdominal tenderness.  Musculoskeletal:        General: No swelling.     Cervical back: Neck supple.  Skin:    General: Skin is warm and dry.  Neurological:     Mental Status: He is alert and oriented to person, place, and time.  Psychiatric:        Attention and Perception: Attention and perception normal.        Mood and Affect: Mood and affect normal.        Speech: Speech normal.        Behavior: Behavior normal. Behavior is cooperative.        Thought Content: Thought content normal.        Cognition and Memory: Cognition normal.    Review of Systems  Constitutional: Negative.   HENT: Negative.    Eyes: Negative.   Respiratory: Negative.    Cardiovascular: Negative.   Gastrointestinal: Negative.   Genitourinary: Negative.   Musculoskeletal: Negative.   Skin: Negative.   Neurological: Negative.   Endo/Heme/Allergies: Negative.   Psychiatric/Behavioral: Negative.  Negative for depression, hallucinations, substance abuse and suicidal ideas. The patient is not nervous/anxious.    Blood pressure 104/77, pulse (!) 115, temperature 97.9 F (36.6 C), temperature source Oral, resp. rate 20, SpO2 96 %. There is no height or weight on file to calculate BMI.  Musculoskeletal: Strength & Muscle Tone: within normal limits Gait & Station: normal Patient leans: Right   [place]  MSE Discharge Disposition for Follow up and Recommendations: Based on my evaluation the patient does not appear to have an emergency medical condition and can be discharged with resources and follow up care in outpatient services for Medication Management, Individual Therapy, and Group Therapy  Patient says he is interested in outpatient group therapy here at [facility]. He requested bus pass so he can return. He was provided with 2 bus passes.   Ophelia Shoulder, NP 07/02/2022, 12:13 AM

## 2022-07-01 NOTE — ED Triage Notes (Signed)
Pt presents to [facility] voluntarily, accompanied by his mother seeking medication management. Pt initially stated he was here for follow up and was told he needed to go upstairs. Per mom, pt has not been taking medications as prescribed and needs to be evaluated. Pt was cooperative during triage process and denies SI, HI, AVH and substance/alcohol use.

## 2022-07-01 NOTE — Discharge Instructions (Signed)

## 2022-09-23 ENCOUNTER — Other Ambulatory Visit: Payer: Self-pay

## 2022-09-23 ENCOUNTER — Emergency Department (HOSPITAL_COMMUNITY): Payer: 59

## 2022-09-23 ENCOUNTER — Inpatient Hospital Stay (HOSPITAL_COMMUNITY)
Admission: EM | Admit: 2022-09-23 | Discharge: 2022-09-26 | DRG: 871 | Disposition: A | Discharge status: Home / Self Care | Payer: 59 | Attending: Family Medicine | Admitting: Family Medicine

## 2022-09-23 DIAGNOSIS — N179 Acute kidney failure, unspecified: Secondary | ICD-10-CM

## 2022-09-23 DIAGNOSIS — A403 Sepsis due to Streptococcus pneumoniae: Principal | ICD-10-CM | POA: Diagnosis present

## 2022-09-23 DIAGNOSIS — E872 Acidosis, unspecified: Secondary | ICD-10-CM | POA: Diagnosis present

## 2022-09-23 DIAGNOSIS — F209 Schizophrenia, unspecified: Secondary | ICD-10-CM | POA: Diagnosis present

## 2022-09-23 DIAGNOSIS — Z79899 Other long term (current) drug therapy: Secondary | ICD-10-CM

## 2022-09-23 DIAGNOSIS — A419 Sepsis, unspecified organism: Principal | ICD-10-CM

## 2022-09-23 DIAGNOSIS — Z87891 Personal history of nicotine dependence: Secondary | ICD-10-CM

## 2022-09-23 DIAGNOSIS — F1410 Cocaine abuse, uncomplicated: Secondary | ICD-10-CM | POA: Diagnosis present

## 2022-09-23 DIAGNOSIS — D649 Anemia, unspecified: Secondary | ICD-10-CM | POA: Diagnosis present

## 2022-09-23 DIAGNOSIS — F419 Anxiety disorder, unspecified: Secondary | ICD-10-CM | POA: Diagnosis present

## 2022-09-23 DIAGNOSIS — R509 Fever, unspecified: Secondary | ICD-10-CM | POA: Diagnosis not present

## 2022-09-23 DIAGNOSIS — Z833 Family history of diabetes mellitus: Secondary | ICD-10-CM

## 2022-09-23 DIAGNOSIS — Z1152 Encounter for screening for COVID-19: Secondary | ICD-10-CM

## 2022-09-23 DIAGNOSIS — K029 Dental caries, unspecified: Secondary | ICD-10-CM | POA: Diagnosis present

## 2022-09-23 DIAGNOSIS — E860 Dehydration: Secondary | ICD-10-CM | POA: Diagnosis present

## 2022-09-23 DIAGNOSIS — R7881 Bacteremia: Secondary | ICD-10-CM | POA: Diagnosis present

## 2022-09-23 DIAGNOSIS — E871 Hypo-osmolality and hyponatremia: Secondary | ICD-10-CM | POA: Diagnosis present

## 2022-09-23 DIAGNOSIS — Z886 Allergy status to analgesic agent status: Secondary | ICD-10-CM

## 2022-09-23 DIAGNOSIS — J154 Pneumonia due to other streptococci: Secondary | ICD-10-CM | POA: Diagnosis present

## 2022-09-23 DIAGNOSIS — E878 Other disorders of electrolyte and fluid balance, not elsewhere classified: Secondary | ICD-10-CM | POA: Diagnosis present

## 2022-09-23 DIAGNOSIS — Z8249 Family history of ischemic heart disease and other diseases of the circulatory system: Secondary | ICD-10-CM

## 2022-09-23 DIAGNOSIS — R6520 Severe sepsis without septic shock: Secondary | ICD-10-CM | POA: Diagnosis present

## 2022-09-23 DIAGNOSIS — J189 Pneumonia, unspecified organism: Secondary | ICD-10-CM | POA: Diagnosis present

## 2022-09-23 DIAGNOSIS — F1210 Cannabis abuse, uncomplicated: Secondary | ICD-10-CM | POA: Diagnosis present

## 2022-09-23 DIAGNOSIS — F1510 Other stimulant abuse, uncomplicated: Secondary | ICD-10-CM | POA: Diagnosis present

## 2022-09-23 LAB — CBC WITH DIFFERENTIAL/PLATELET
Abs Immature Granulocytes: 0.08 10*3/uL — ABNORMAL HIGH (ref 0.00–0.07)
Basophils Absolute: 0.1 10*3/uL (ref 0.0–0.1)
Basophils Relative: 0 %
Eosinophils Absolute: 0.2 10*3/uL (ref 0.0–0.5)
Eosinophils Relative: 1 %
HCT: 38 % — ABNORMAL LOW (ref 39.0–52.0)
Hemoglobin: 12.7 g/dL — ABNORMAL LOW (ref 13.0–17.0)
Immature Granulocytes: 1 %
Lymphocytes Relative: 13 %
Lymphs Abs: 2.1 10*3/uL (ref 0.7–4.0)
MCH: 26.5 pg (ref 26.0–34.0)
MCHC: 33.4 g/dL (ref 30.0–36.0)
MCV: 79.2 fL — ABNORMAL LOW (ref 80.0–100.0)
Monocytes Absolute: 0.8 10*3/uL (ref 0.1–1.0)
Monocytes Relative: 5 %
Neutro Abs: 12.7 10*3/uL — ABNORMAL HIGH (ref 1.7–7.7)
Neutrophils Relative %: 80 %
Platelets: 288 10*3/uL (ref 150–400)
RBC: 4.8 MIL/uL (ref 4.22–5.81)
RDW: 13.8 % (ref 11.5–15.5)
WBC: 15.9 10*3/uL — ABNORMAL HIGH (ref 4.0–10.5)
nRBC: 0 % (ref 0.0–0.2)

## 2022-09-23 LAB — COMPREHENSIVE METABOLIC PANEL
ALT: 26 U/L (ref 0–44)
AST: 63 U/L — ABNORMAL HIGH (ref 15–41)
Albumin: 3 g/dL — ABNORMAL LOW (ref 3.5–5.0)
Alkaline Phosphatase: 54 U/L (ref 38–126)
Anion gap: 14 (ref 5–15)
BUN: 20 mg/dL (ref 6–20)
CO2: 24 mmol/L (ref 22–32)
Calcium: 8.5 mg/dL — ABNORMAL LOW (ref 8.9–10.3)
Chloride: 94 mmol/L — ABNORMAL LOW (ref 98–111)
Creatinine, Ser: 2.04 mg/dL — ABNORMAL HIGH (ref 0.61–1.24)
GFR, Estimated: 43 mL/min — ABNORMAL LOW (ref >60)
Glucose, Bld: 127 mg/dL — ABNORMAL HIGH (ref 70–99)
Potassium: 4 mmol/L (ref 3.5–5.1)
Sodium: 132 mmol/L — ABNORMAL LOW (ref 135–145)
Total Bilirubin: 0.9 mg/dL (ref 0.3–1.2)
Total Protein: 7.2 g/dL (ref 6.5–8.1)

## 2022-09-23 MED ORDER — SODIUM CHLORIDE 0.9 % IV SOLN
500.0000 mg | INTRAVENOUS | Status: DC
  Administered 2022-09-23: 500 mg via INTRAVENOUS
  Filled 2022-09-23: qty 5

## 2022-09-23 MED ORDER — SODIUM CHLORIDE 0.9 % IV SOLN
2.0000 g | INTRAVENOUS | Status: DC
  Administered 2022-09-24: 2 g via INTRAVENOUS
  Filled 2022-09-23: qty 20

## 2022-09-23 MED ORDER — LACTATED RINGERS IV BOLUS (SEPSIS)
1000.0000 mL | Freq: Once | INTRAVENOUS | Status: AC
  Administered 2022-09-24: 1000 mL via INTRAVENOUS

## 2022-09-23 MED ORDER — ACETAMINOPHEN 500 MG PO TABS
1000.0000 mg | ORAL_TABLET | Freq: Once | ORAL | Status: AC
  Administered 2022-09-23: 1000 mg via ORAL
  Filled 2022-09-23: qty 2

## 2022-09-23 MED ORDER — LACTATED RINGERS IV BOLUS (SEPSIS)
500.0000 mL | Freq: Once | INTRAVENOUS | Status: AC
  Administered 2022-09-24: 500 mL via INTRAVENOUS

## 2022-09-23 MED ORDER — LACTATED RINGERS IV SOLN: INTRAVENOUS | Status: DC

## 2022-09-23 NOTE — Sepsis Progress Note (Signed)
Elink following for sepsis protocol. 

## 2022-09-23 NOTE — ED Triage Notes (Signed)
Patient reports fever with productive cough / chest congestion and fatigue for several days .

## 2022-09-23 NOTE — ED Provider Notes (Signed)
[facility] Provider Note   CSN: [id number] Arrival date & time: 09/23/22  2220     History  Chief Complaint  Patient presents with   Cough/Fever    Anthony Wiggins is a 37 y.o. male.  HPI  Patient is a 37 year old male who denies any medical problems but does have a past medical history significant for psychoactive substance-induced psychosis, tachycardia, smoking, cocaine and amphetamine use, dental caries, schizophrenia  He is present emergency room today with cough, fatigue, malaise, fevers ongoing since Tuesday/6 days ago.  He denies any hemoptysis or chest pain.  He states he feels sometimes short of breath primarily very fatigued.  He denies any unilateral or bilateral leg swelling.  No history of DVT or VTE.  He states that he primarily feels ill at night. No nausea or vomiting.  He took Tylenol at 6 PM has not taken any other medications.    Home Medications Prior to Admission medications   Medication Sig Start Date End Date Taking? Authorizing Provider  hydrOXYzine (ATARAX) 25 MG tablet Take 1 tablet (25 mg total) by mouth every 6 (six) hours. Patient taking differently: Take 25 mg by mouth every 4 (four) hours as needed for anxiety. 06/20/22  Yes Rankin, Shuvon B, NP  OLANZapine (ZYPREXA) 15 MG tablet Take 1 tablet (15 mg total) by mouth daily with supper. Patient taking differently: Take 15 mg by mouth at bedtime. 06/20/22  Yes Rankin, Shuvon B, NP  Pseudoeph-CPM-DM-APAP (TYLENOL COLD & FLU DAY/NIGHT PO) Take 1 tablet by mouth daily as needed (For cold symptoms).   Yes [provider]      Allergies    Fish allergy and Naproxen    Review of Systems   Review of Systems  Physical Exam Updated Vital Signs BP 101/62   Pulse 99   Temp 98.9 F (37.2 C)   Resp 16   SpO2 95%  Physical Exam Vitals and nursing note reviewed.  Constitutional:      Appearance: He is ill-appearing. He is not toxic-appearing.      Comments: Thin 37 year old male appears unwell but nontoxic.  HENT:     Head: Normocephalic and atraumatic.     Nose: Nose normal.     Mouth/Throat:     Mouth: Mucous membranes are dry.  Eyes:     General: No scleral icterus. Cardiovascular:     Rate and Rhythm: Normal rate and regular rhythm.     Pulses: Normal pulses.     Heart sounds: Normal heart sounds.  Pulmonary:     Effort: Pulmonary effort is normal. No respiratory distress.     Breath sounds: No wheezing.  Abdominal:     Palpations: Abdomen is soft.     Tenderness: There is no abdominal tenderness. There is no guarding or rebound.  Musculoskeletal:     Cervical back: Normal range of motion.     Right lower leg: No edema.     Left lower leg: No edema.     Comments: No leg swelling or edema no calf tenderness  Skin:    General: Skin is warm and dry.     Capillary Refill: Capillary refill takes less than 2 seconds.  Neurological:     Mental Status: He is alert. Mental status is at baseline.  Psychiatric:        Mood and Affect: Mood normal.        Behavior: Behavior normal.     ED Results /  Procedures / Treatments   Labs (all labs ordered are listed, but only abnormal results are displayed) Labs Reviewed  CBC WITH DIFFERENTIAL/PLATELET - Abnormal; Notable for the following components:      Result Value   WBC 15.9 (*)    Hemoglobin 12.7 (*)    HCT 38.0 (*)    MCV 79.2 (*)    Neutro Abs 12.7 (*)    Abs Immature Granulocytes 0.08 (*)    All other components within normal limits  COMPREHENSIVE METABOLIC PANEL - Abnormal; Notable for the following components:   Sodium 132 (*)    Chloride 94 (*)    Glucose, Bld 127 (*)    Creatinine, Ser 2.04 (*)    Calcium 8.5 (*)    Albumin 3.0 (*)    AST 63 (*)    GFR, Estimated 43 (*)    All other components within normal limits  LACTIC ACID, PLASMA - Abnormal; Notable for the following components:   Lactic Acid, Venous 2.2 (*)    All other components within normal  limits  URINALYSIS, W/ REFLEX TO CULTURE (INFECTION SUSPECTED) - Abnormal; Notable for the following components:   Color, Urine AMBER (*)    APPearance CLOUDY (*)    Hgb urine dipstick LARGE (*)    Protein, ur >=300 (*)    Bacteria, UA FEW (*)    Non Squamous Epithelial 0-5 (*)    All other components within normal limits  RESP PANEL BY RT-PCR (RSV, FLU A&B, COVID)  RVPGX2  CULTURE, BLOOD (ROUTINE X 2)  CULTURE, BLOOD (ROUTINE X 2)  LACTIC ACID, PLASMA  PROTIME-INR  APTT    EKG None  Radiology DG Chest 2 View  Result Date: 09/23/2022 CLINICAL DATA:  Cough and congestion EXAM: CHEST - 2 VIEW COMPARISON:  None Available. FINDINGS: Right medial lung base consolidation. No pleural effusion or pneumothorax. Cardiomediastinal contours are normal. IMPRESSION: Right medial lung base consolidation, concerning for pneumonia. Electronically Signed   By: Ulyses Jarred M.D.   On: 09/23/2022 22:53    Procedures .Critical Care  Performed by: Tedd Sias, PA Authorized by: Tedd Sias, PA   Critical care provider statement:    Critical care time (minutes):  35   Critical care time was exclusive of:  Separately billable procedures and treating other patients and teaching time   Critical care was necessary to treat or prevent imminent or life-threatening deterioration of the following conditions:  Sepsis   Critical care was time spent personally by me on the following activities:  Development of treatment plan with patient or surrogate, review of old charts, re-evaluation of patient's condition, pulse oximetry, ordering and review of radiographic studies, ordering and review of laboratory studies, ordering and performing treatments and interventions, obtaining history from patient or surrogate, examination of patient and evaluation of patient's response to treatment   Care discussed with: admitting provider       Medications Ordered in ED Medications  lactated ringers infusion (  Intravenous New Bag/Given 09/24/22 0001)  cefTRIAXone (ROCEPHIN) 2 g in sodium chloride 0.9 % 100 mL IVPB (0 g Intravenous Stopped 09/24/22 0031)  azithromycin (ZITHROMAX) 500 mg in sodium chloride 0.9 % 250 mL IVPB (0 mg Intravenous Stopped 09/24/22 0148)  lactated ringers bolus 1,000 mL (0 mLs Intravenous Stopped 09/24/22 0031)    And  lactated ringers bolus 1,000 mL (0 mLs Intravenous Stopped 09/24/22 0148)    And  lactated ringers bolus 500 mL (0 mLs Intravenous Stopped 09/24/22 0148)  acetaminophen (  TYLENOL) tablet 1,000 mg (1,000 mg Oral Given 09/23/22 2355)    ED Course/ Medical Decision Making/ A&P Clinical Course as of 09/24/22 0154  Sun Sep 23, 2022  2333 WBC(!): 15.9 [WF]  2333 NEUT#(!): 12.7 [WF]  Mon Sep 24, 2022  0028 Pulse Rate(!): 120 [WF]  0028 BP: 95/61 [WF]  0028 Meet sepsis criteria.  Initiated on azithromycin and Rocephin.  Aggressive IV hydration, Tylenol.  Will require admission  for continued hydration, AKI, sepsis [WF]    Clinical Course User Index [WF] Tedd Sias, [place]                             Medical Decision Making Amount and/or Complexity of Data Reviewed Labs: ordered. Decision-making details documented in ED Course. Radiology: ordered. ECG/medicine tests: ordered.  Risk OTC drugs. Prescription drug management. Decision regarding hospitalization.   This patient presents to the ED for concern of cough, this involves a number of treatment options, and is a complaint that carries with it a moderate to high risk of complications and morbidity. A differential diagnosis was considered for the patient's symptoms which is discussed below:   Differential diagnosis for emergent cause of cough includes but is not limited to upper respiratory infection, lower respiratory infection, allergies, asthma, irritants, foreign body, medications such as ACE inhibitors, reflux, asthma, CHF, lung cancer, interstitial lung disease, psychiatric causes, postnasal drip and  postinfectious bronchospasm.    Co morbidities: Discussed in HPI   Brief History:  Patient is a 37 year old male who denies any medical problems but does have a past medical history significant for psychoactive substance-induced psychosis, tachycardia, smoking, cocaine and amphetamine use, dental caries, schizophrenia  He is present emergency room today with cough, fatigue, malaise, fevers ongoing since Tuesday/6 days ago.  He denies any hemoptysis or chest pain.  He states he feels sometimes short of breath primarily very fatigued.  He denies any unilateral or bilateral leg swelling.  No history of DVT or VTE.  He states that he primarily feels ill at night. No nausea or vomiting.  He took Tylenol at 6 PM has not taken any other medications.    EMR reviewed including pt PMHx, past surgical history and past visits to ER.   See HPI for more details   Lab Tests:   I ordered and independently interpreted labs. Labs notable for Leukocytosis of 16, mild anemia, CMP with creatinine which is 2.04 approximately double his baseline.  Mild hyponatremia hypochloremia consistent with dehydration.  Blood cultures obtained and pending, lactic initial lactic elevated at 2.2 improved to 1.5    Imaging Studies:  Abnormal findings. I personally reviewed all imaging studies. Imaging notable for  Chest x-ray with focal right lower lobe pneumonia.  He has not had any seizures, altered mental status and have a low suspicion for aspiration.  Cardiac Monitoring:  The patient was maintained on a cardiac monitor.  I personally viewed and interpreted the cardiac monitored which showed an underlying rhythm of: Sinus tachycardia EKG non-ischemic   Medicines ordered:  I ordered medication including LR 6mL/kg total of 2.5L bolus and maintenance fluids for hydration. Tylenol, Rocephin/azithromycin administered.  Reevaluation of the patient after these medicines showed that the patient improved I  have reviewed the patients home medicines and have made adjustments as needed   Critical Interventions:     Consults/Attending Physician   I discussed this case with my attending physician who cosigned this note including patient's  presenting symptoms, physical exam, and planned diagnostics and interventions. Attending physician stated agreement with plan or made changes to plan which were implemented.  Discussed with Dr. Marcello Moores for admission   Reevaluation:  After the interventions noted above I re-evaluated patient and found that they have :improved   Social Determinants of Health:      Problem List / ED Course:  Pneumonia right lower lobe no indication that this is an aspiration event.  Patient came in with sepsis criteria met but vital signs have improved with hydration and Tylenol and antibiotics.  Lactic acidosis has resolved with hydration.  Will admit to hospital service.  Patient is agreeable to plan.   Dispostion:  After consideration of the diagnostic results and the patients response to treatment, I feel that the patent would benefit from admission  Final Clinical Impression(s) / ED Diagnoses Final diagnoses:  Sepsis with acute renal failure without septic shock, due to unspecified organism, unspecified acute renal failure type ([place])  AKI (acute kidney injury) [facility])  Dehydration    Rx / DC Orders ED Discharge Orders     None         Tedd Sias, [place] 09/24/22 0158    Maudie Flakes, MD 09/24/22 [phone]

## 2022-09-23 NOTE — ED Provider Notes (Incomplete)
[facility] Provider Note   CSN: [id number] Arrival date & time: 09/23/22  2220     History {Add pertinent medical, surgical, social history, OB history to HPI:1} Chief Complaint  Patient presents with  . Cough/Fever    Anthony Wiggins is a 37 y.o. male.  HPI  Patient is a 37 year old male who denies any medical problems but does have a past medical history significant for psychoactive substance-induced psychosis, tachycardia, smoking, cocaine and amphetamine use, dental caries, schizophrenia  He is present emergency room today with cough, fatigue, malaise, fevers ongoing since Tuesday/6 days ago.  He denies any hemoptysis or chest pain.  He states he feels sometimes short of breath primarily very fatigued.  He denies any unilateral or bilateral leg swelling.  No history of DVT or VTE.  He states that he primarily feels ill at night. No nausea or vomiting.  He took Tylenol at 6 PM has not taken any other medications.    Home Medications Prior to Admission medications   Medication Sig Start Date End Date Taking? Authorizing Provider  hydrOXYzine (ATARAX) 25 MG tablet Take 1 tablet (25 mg total) by mouth every 6 (six) hours. Patient taking differently: Take 25 mg by mouth every 4 (four) hours as needed for anxiety. 06/20/22  Yes Rankin, Shuvon B, NP  OLANZapine (ZYPREXA) 15 MG tablet Take 1 tablet (15 mg total) by mouth daily with supper. Patient taking differently: Take 15 mg by mouth at bedtime. 06/20/22  Yes Rankin, Shuvon B, NP  Pseudoeph-CPM-DM-APAP (TYLENOL COLD & FLU DAY/NIGHT PO) Take 1 tablet by mouth daily as needed (For cold symptoms).   Yes [provider]      Allergies    Fish allergy and Naproxen    Review of Systems   Review of Systems  Physical Exam Updated Vital Signs BP (!) 98/53 (BP Location: Right Arm)   Pulse (!) 110   Temp 98.9 F (37.2 C)   Resp 17   SpO2 98%  Physical Exam Vitals and nursing  note reviewed.  Constitutional:      Appearance: He is ill-appearing. He is not toxic-appearing.     Comments: Thin 37 year old male appears unwell but nontoxic.  HENT:     Head: Normocephalic and atraumatic.     Nose: Nose normal.     Mouth/Throat:     Mouth: Mucous membranes are dry.  Eyes:     General: No scleral icterus. Cardiovascular:     Rate and Rhythm: Normal rate and regular rhythm.     Pulses: Normal pulses.     Heart sounds: Normal heart sounds.  Pulmonary:     Effort: Pulmonary effort is normal. No respiratory distress.     Breath sounds: No wheezing.  Abdominal:     Palpations: Abdomen is soft.     Tenderness: There is no abdominal tenderness. There is no guarding or rebound.  Musculoskeletal:     Cervical back: Normal range of motion.     Right lower leg: No edema.     Left lower leg: No edema.     Comments: No leg swelling or edema no calf tenderness  Skin:    General: Skin is warm and dry.     Capillary Refill: Capillary refill takes less than 2 seconds.  Neurological:     Mental Status: He is alert. Mental status is at baseline.  Psychiatric:        Mood and Affect: Mood normal.  Behavior: Behavior normal.     ED Results / Procedures / Treatments   Labs (all labs ordered are listed, but only abnormal results are displayed) Labs Reviewed  CBC WITH DIFFERENTIAL/PLATELET - Abnormal; Notable for the following components:      Result Value   WBC 15.9 (*)    Hemoglobin 12.7 (*)    HCT 38.0 (*)    MCV 79.2 (*)    Neutro Abs 12.7 (*)    Abs Immature Granulocytes 0.08 (*)    All other components within normal limits  COMPREHENSIVE METABOLIC PANEL - Abnormal; Notable for the following components:   Sodium 132 (*)    Chloride 94 (*)    Glucose, Bld 127 (*)    Creatinine, Ser 2.04 (*)    Calcium 8.5 (*)    Albumin 3.0 (*)    AST 63 (*)    GFR, Estimated 43 (*)    All other components within normal limits  RESP PANEL BY RT-PCR (RSV, FLU A&B,  COVID)  RVPGX2  CULTURE, BLOOD (ROUTINE X 2)  CULTURE, BLOOD (ROUTINE X 2)  LACTIC ACID, PLASMA  LACTIC ACID, PLASMA  PROTIME-INR  APTT  URINALYSIS, W/ REFLEX TO CULTURE (INFECTION SUSPECTED)    EKG None  Radiology DG Chest 2 View  Result Date: 09/23/2022 CLINICAL DATA:  Cough and congestion EXAM: CHEST - 2 VIEW COMPARISON:  None Available. FINDINGS: Right medial lung base consolidation. No pleural effusion or pneumothorax. Cardiomediastinal contours are normal. IMPRESSION: Right medial lung base consolidation, concerning for pneumonia. Electronically Signed   By: Ulyses Jarred M.D.   On: 09/23/2022 22:53    Procedures Procedures  {Document cardiac monitor, telemetry assessment procedure when appropriate:1}  Medications Ordered in ED Medications  lactated ringers infusion (has no administration in time range)  lactated ringers bolus 1,000 mL (has no administration in time range)    And  lactated ringers bolus 1,000 mL (has no administration in time range)    And  lactated ringers bolus 500 mL (has no administration in time range)  cefTRIAXone (ROCEPHIN) 2 g in sodium chloride 0.9 % 100 mL IVPB (has no administration in time range)  azithromycin (ZITHROMAX) 500 mg in sodium chloride 0.9 % 250 mL IVPB (has no administration in time range)  acetaminophen (TYLENOL) tablet 1,000 mg (has no administration in time range)    ED Course/ Medical Decision Making/ A&P Clinical Course as of 09/23/22 2334  Sun Sep 23, 2022  2333 WBC(!): 15.9 [WF]  2333 NEUT#(!): 12.7 [WF]    Clinical Course User Index [WF] Tedd Sias, PA   {   Click here for ABCD2, HEART and other calculatorsREFRESH Note before signing :1}                          Medical Decision Making Amount and/or Complexity of Data Reviewed Labs: ordered. Decision-making details documented in ED Course. Radiology: ordered. ECG/medicine tests: ordered.  Risk OTC drugs. Prescription drug management.   This patient  presents to the ED for concern of cough, this involves a number of treatment options, and is a complaint that carries with it a moderate to high risk of complications and morbidity. A differential diagnosis was considered for the patient's symptoms which is discussed below:   Differential diagnosis for emergent cause of cough includes but is not limited to upper respiratory infection, lower respiratory infection, allergies, asthma, irritants, foreign body, medications such as ACE inhibitors, reflux, asthma, CHF, lung cancer, interstitial  lung disease, psychiatric causes, postnasal drip and postinfectious bronchospasm.    Co morbidities: Discussed in HPI   Brief History:  Patient is a 37 year old male who denies any medical problems but does have a past medical history significant for psychoactive substance-induced psychosis, tachycardia, smoking, cocaine and amphetamine use, dental caries, schizophrenia  He is present emergency room today with cough, fatigue, malaise, fevers ongoing since Tuesday/6 days ago.  He denies any hemoptysis or chest pain.  He states he feels sometimes short of breath primarily very fatigued.  He denies any unilateral or bilateral leg swelling.  No history of DVT or VTE.  He states that he primarily feels ill at night. No nausea or vomiting.  He took Tylenol at 6 PM has not taken any other medications.    EMR reviewed including pt PMHx, past surgical history and past visits to ER.   See HPI for more details   Lab Tests:   {Blank single:19197::"I ordered and independently interpreted labs. Labs notable for","I personally reviewed all laboratory work and imaging. Metabolic panel without any acute abnormality specifically kidney function within normal limits and no significant electrolyte abnormalities. CBC without leukocytosis or significant anemia."}   Imaging Studies:  {Blank single:19197::"NAD. I personally reviewed all imaging studies and no acute  abnormality found. I agree with radiology interpretation.","Abnormal findings. I personally reviewed all imaging studies. Imaging notable for","No imaging studies ordered for this patient"}    Cardiac Monitoring:  .{Blank single:19197::"The patient was maintained on a cardiac monitor.  I personally viewed and interpreted the cardiac monitored which showed an underlying rhythm of:","NA"} .{Blank single:19197::"EKG non-ischemic","NA"}   Medicines ordered:  I ordered medication including ***  for *** Reevaluation of the patient after these medicines showed that the patient {resolved/improved/worsened:23923::"improved"} I have reviewed the patients home medicines and have made adjustments as needed   Critical Interventions:  .***   Consults/Attending Physician   .{Blank single:19197::"I requested consultation with ***,  and discussed lab and imaging findings as well as pertinent plan - they recommend: ***","I discussed this case with my attending physician who cosigned this note including patient's presenting symptoms, physical exam, and planned diagnostics and interventions. Attending physician stated agreement with plan or made changes to plan which were implemented."}   Reevaluation:  After the interventions noted above I re-evaluated patient and found that they have :{resolved/improved/worsened:23923::"improved"}   Social Determinants of Health:  Marland Kitchen{Blank single:19197::"Given cab voucher","Social work/case management involved","The patient's social determinants of health were a factor in the care of this patient"}    Problem List / ED Course:  ***   Dispostion:  After consideration of the diagnostic results and the patients response to treatment, I feel that the patent would benefit from ***        Final Clinical Impression(s) / ED Diagnoses Final diagnoses:  None    Rx / DC Orders ED Discharge Orders     None

## 2022-09-24 ENCOUNTER — Encounter (HOSPITAL_COMMUNITY): Payer: Self-pay

## 2022-09-24 DIAGNOSIS — Z886 Allergy status to analgesic agent status: Secondary | ICD-10-CM | POA: Diagnosis not present

## 2022-09-24 DIAGNOSIS — N179 Acute kidney failure, unspecified: Secondary | ICD-10-CM

## 2022-09-24 DIAGNOSIS — J154 Pneumonia due to other streptococci: Secondary | ICD-10-CM | POA: Diagnosis present

## 2022-09-24 DIAGNOSIS — J189 Pneumonia, unspecified organism: Secondary | ICD-10-CM | POA: Diagnosis present

## 2022-09-24 DIAGNOSIS — F1510 Other stimulant abuse, uncomplicated: Secondary | ICD-10-CM | POA: Diagnosis present

## 2022-09-24 DIAGNOSIS — E872 Acidosis, unspecified: Secondary | ICD-10-CM | POA: Diagnosis present

## 2022-09-24 DIAGNOSIS — F1410 Cocaine abuse, uncomplicated: Secondary | ICD-10-CM | POA: Diagnosis present

## 2022-09-24 DIAGNOSIS — E871 Hypo-osmolality and hyponatremia: Secondary | ICD-10-CM | POA: Diagnosis present

## 2022-09-24 DIAGNOSIS — R7881 Bacteremia: Secondary | ICD-10-CM | POA: Diagnosis present

## 2022-09-24 DIAGNOSIS — Z833 Family history of diabetes mellitus: Secondary | ICD-10-CM | POA: Diagnosis not present

## 2022-09-24 DIAGNOSIS — F419 Anxiety disorder, unspecified: Secondary | ICD-10-CM | POA: Diagnosis present

## 2022-09-24 DIAGNOSIS — K029 Dental caries, unspecified: Secondary | ICD-10-CM | POA: Diagnosis present

## 2022-09-24 DIAGNOSIS — F209 Schizophrenia, unspecified: Secondary | ICD-10-CM | POA: Diagnosis present

## 2022-09-24 DIAGNOSIS — Z79899 Other long term (current) drug therapy: Secondary | ICD-10-CM | POA: Diagnosis not present

## 2022-09-24 DIAGNOSIS — D649 Anemia, unspecified: Secondary | ICD-10-CM | POA: Diagnosis present

## 2022-09-24 DIAGNOSIS — F1210 Cannabis abuse, uncomplicated: Secondary | ICD-10-CM | POA: Diagnosis present

## 2022-09-24 DIAGNOSIS — Z8249 Family history of ischemic heart disease and other diseases of the circulatory system: Secondary | ICD-10-CM | POA: Diagnosis not present

## 2022-09-24 DIAGNOSIS — A403 Sepsis due to Streptococcus pneumoniae: Secondary | ICD-10-CM | POA: Diagnosis present

## 2022-09-24 DIAGNOSIS — Z1152 Encounter for screening for COVID-19: Secondary | ICD-10-CM | POA: Diagnosis not present

## 2022-09-24 DIAGNOSIS — J13 Pneumonia due to Streptococcus pneumoniae: Secondary | ICD-10-CM

## 2022-09-24 DIAGNOSIS — Z87891 Personal history of nicotine dependence: Secondary | ICD-10-CM | POA: Diagnosis not present

## 2022-09-24 DIAGNOSIS — R6520 Severe sepsis without septic shock: Secondary | ICD-10-CM | POA: Diagnosis present

## 2022-09-24 DIAGNOSIS — R509 Fever, unspecified: Secondary | ICD-10-CM | POA: Diagnosis present

## 2022-09-24 DIAGNOSIS — E878 Other disorders of electrolyte and fluid balance, not elsewhere classified: Secondary | ICD-10-CM | POA: Diagnosis present

## 2022-09-24 DIAGNOSIS — E860 Dehydration: Secondary | ICD-10-CM | POA: Diagnosis present

## 2022-09-24 LAB — URINALYSIS, W/ REFLEX TO CULTURE (INFECTION SUSPECTED)
Bilirubin Urine: NEGATIVE
Glucose, UA: NEGATIVE mg/dL
Ketones, ur: NEGATIVE mg/dL
Leukocytes,Ua: NEGATIVE
Nitrite: NEGATIVE
Protein, ur: >=300 mg/dL — AB
Specific Gravity, Urine: 1.025 (ref 1.005–1.030)
pH: 5 (ref 5.0–8.0)

## 2022-09-24 LAB — CBC WITH DIFFERENTIAL/PLATELET
Abs Immature Granulocytes: 0.07 10*3/uL (ref 0.00–0.07)
Basophils Absolute: 0 10*3/uL (ref 0.0–0.1)
Basophils Relative: 0 %
Eosinophils Absolute: 0.1 10*3/uL (ref 0.0–0.5)
Eosinophils Relative: 0 %
HCT: 29.7 % — ABNORMAL LOW (ref 39.0–52.0)
Hemoglobin: 9.9 g/dL — ABNORMAL LOW (ref 13.0–17.0)
Immature Granulocytes: 1 %
Lymphocytes Relative: 12 %
Lymphs Abs: 1.7 10*3/uL (ref 0.7–4.0)
MCH: 26.5 pg (ref 26.0–34.0)
MCHC: 33.3 g/dL (ref 30.0–36.0)
MCV: 79.4 fL — ABNORMAL LOW (ref 80.0–100.0)
Monocytes Absolute: 0.7 10*3/uL (ref 0.1–1.0)
Monocytes Relative: 5 %
Neutro Abs: 11.1 10*3/uL — ABNORMAL HIGH (ref 1.7–7.7)
Neutrophils Relative %: 82 %
Platelets: 209 10*3/uL (ref 150–400)
RBC: 3.74 MIL/uL — ABNORMAL LOW (ref 4.22–5.81)
RDW: 13.9 % (ref 11.5–15.5)
WBC: 13.6 10*3/uL — ABNORMAL HIGH (ref 4.0–10.5)
nRBC: 0 % (ref 0.0–0.2)

## 2022-09-24 LAB — BLOOD CULTURE ID PANEL (REFLEXED) - BCID2

## 2022-09-24 LAB — RESP PANEL BY RT-PCR (RSV, FLU A&B, COVID)  RVPGX2
Influenza A by PCR: NEGATIVE
Influenza B by PCR: NEGATIVE
Resp Syncytial Virus by PCR: NEGATIVE
SARS Coronavirus 2 by RT PCR: NEGATIVE

## 2022-09-24 LAB — PROCALCITONIN: Procalcitonin: 5.78 ng/mL

## 2022-09-24 LAB — PROTIME-INR
INR: 1.2 (ref 0.8–1.2)
Prothrombin Time: 14.7 seconds (ref 11.4–15.2)

## 2022-09-24 LAB — RAPID URINE DRUG SCREEN, HOSP PERFORMED
Amphetamines: POSITIVE — AB
Barbiturates: NOT DETECTED
Benzodiazepines: NOT DETECTED
Cocaine: NOT DETECTED
Opiates: NOT DETECTED
Tetrahydrocannabinol: POSITIVE — AB

## 2022-09-24 LAB — CREATININE, SERUM
Creatinine, Ser: 1.56 mg/dL — ABNORMAL HIGH (ref 0.61–1.24)
GFR, Estimated: 59 mL/min — ABNORMAL LOW (ref >60)

## 2022-09-24 LAB — LACTIC ACID, PLASMA
Lactic Acid, Venous: 1.5 mmol/L (ref 0.5–1.9)
Lactic Acid, Venous: 2.2 mmol/L (ref 0.5–1.9)

## 2022-09-24 LAB — STREP PNEUMONIAE URINARY ANTIGEN: Strep Pneumo Urinary Antigen: POSITIVE — AB

## 2022-09-24 LAB — APTT: aPTT: 32 seconds (ref 24–36)

## 2022-09-24 LAB — HIV ANTIBODY (ROUTINE TESTING W REFLEX): HIV Screen 4th Generation wRfx: NONREACTIVE

## 2022-09-24 MED ORDER — LACTATED RINGERS IV SOLN: INTRAVENOUS | Status: AC

## 2022-09-24 MED ORDER — ACETAMINOPHEN 325 MG PO TABS: 650.0000 mg | ORAL_TABLET | Freq: Four times a day (QID) | ORAL | Status: DC | PRN

## 2022-09-24 MED ORDER — ORAL CARE MOUTH RINSE: 15.0000 mL | OROMUCOSAL | Status: DC | PRN

## 2022-09-24 MED ORDER — HEPARIN SODIUM (PORCINE) 5000 UNIT/ML IJ SOLN
5000.0000 [IU] | Freq: Three times a day (TID) | INTRAMUSCULAR | Status: DC
  Administered 2022-09-24 (×3): 5000 [IU] via SUBCUTANEOUS
  Filled 2022-09-24 (×2): qty 1

## 2022-09-24 MED ORDER — SODIUM CHLORIDE 0.9 % IV SOLN
2.0000 g | INTRAVENOUS | Status: DC
  Administered 2022-09-25: 2 g via INTRAVENOUS
  Filled 2022-09-24 (×2): qty 20

## 2022-09-24 MED ORDER — HYDROXYZINE HCL 25 MG PO TABS
25.0000 mg | ORAL_TABLET | Freq: Four times a day (QID) | ORAL | Status: DC
  Administered 2022-09-24 – 2022-09-26 (×6): 25 mg via ORAL
  Filled 2022-09-24 (×8): qty 1

## 2022-09-24 MED ORDER — OLANZAPINE 5 MG PO TABS
15.0000 mg | ORAL_TABLET | Freq: Every day | ORAL | Status: DC
  Administered 2022-09-24 – 2022-09-25 (×2): 15 mg via ORAL
  Filled 2022-09-24 (×2): qty 1

## 2022-09-24 MED ORDER — SODIUM CHLORIDE 0.9 % IV SOLN: 500.0000 mg | INTRAVENOUS | Status: DC

## 2022-09-24 NOTE — Progress Notes (Signed)
Urinary strep antigen came back positive. Ok to continue ceftriaxone and dc azithromycin per Dr. Raelyn Mora.  Onnie Boer, PharmD, BCIDP, AAHIVP, CPP Infectious Disease Pharmacist 09/24/2022 2:11 PM

## 2022-09-24 NOTE — Plan of Care (Signed)

## 2022-09-24 NOTE — ED Notes (Signed)
ED TO INPATIENT HANDOFF REPORT  ED Nurse Name and Phone # Ophelia Charter RN [phone]  S Name/Age/Gender Anthony Wiggins 37 y.o. male Room/Bed: 031C/031C  Code Status   Code Status: Full Code  Home/SNF/Other Home Patient oriented to: self, place, time, and situation Is this baseline? Yes   Triage Complete: Triage complete  Chief Complaint CAP (community acquired pneumonia) [J18.9]  Triage Note Patient reports fever with productive cough / chest congestion and fatigue for several days .    Allergies Allergies  Allergen Reactions   Fish Allergy Nausea And Vomiting   Naproxen Rash    Level of Care/Admitting Diagnosis ED Disposition     ED Disposition  Admit   Condition  --   [place]: [place] [[id number]]  Level of Care: Progressive [102]  Admit to Progressive based on following criteria: MULTISYSTEM THREATS such as stable sepsis, metabolic/electrolyte imbalance with or without encephalopathy that is responding to early treatment.  May admit patient to [facility] or [facility] if equivalent level of care is available:: No  Covid Evaluation: Confirmed COVID Negative  Diagnosis: CAP (community acquired pneumonia) [id number]  Admitting Physician: Clance Boll [id number]  Attending Physician: Clance Boll [id number]  Certification:: I certify this patient will need inpatient services for at least 2 midnights  Estimated Length of Stay: 3          B Medical/Surgery History Past Medical History:  Diagnosis Date   Dental caries    Schizophrenia    History reviewed. No pertinent surgical history.   A IV Location/Drains/Wounds Patient Lines/Drains/Airways Status     Active Line/Drains/Airways     Name Placement date Placement time Site Days   Peripheral IV 09/23/22 20 G Right Antecubital 09/23/22  2333  Antecubital  1   Peripheral IV 09/23/22 20 G Left Antecubital 09/23/22  2350  Antecubital  1             Intake/Output Last 24 hours  Intake/Output Summary (Last 24 hours) at 09/24/2022 0404 Last data filed at 09/24/2022 0148 Gross per 24 hour  Intake 3750 ml  Output --  Net 3750 ml    Labs/Imaging Results for orders placed or performed during the hospital encounter of 09/23/22 (from the past 48 hour(s))  CBC with Differential     Status: Abnormal   Collection Time: 09/23/22 10:31 PM  Result Value Ref Range   WBC 15.9 (H) 4.0 - 10.5 K/uL   RBC 4.80 4.22 - 5.81 MIL/uL   Hemoglobin 12.7 (L) 13.0 - 17.0 g/dL   HCT 38.0 (L) 39.0 - 52.0 %   MCV 79.2 (L) 80.0 - 100.0 fL   MCH 26.5 26.0 - 34.0 pg   MCHC 33.4 30.0 - 36.0 g/dL   RDW 13.8 11.5 - 15.5 %   Platelets 288 150 - 400 K/uL   nRBC 0.0 0.0 - 0.2 %   Neutrophils Relative % 80 %   Neutro Abs 12.7 (H) 1.7 - 7.7 K/uL   Lymphocytes Relative 13 %   Lymphs Abs 2.1 0.7 - 4.0 K/uL   Monocytes Relative 5 %   Monocytes Absolute 0.8 0.1 - 1.0 K/uL   Eosinophils Relative 1 %   Eosinophils Absolute 0.2 0.0 - 0.5 K/uL   Basophils Relative 0 %   Basophils Absolute 0.1 0.0 - 0.1 K/uL   Immature Granulocytes 1 %   Abs Immature Granulocytes 0.08 (H) 0.00 - 0.07 K/uL    Comment: Performed at [facility], [place] [address] [place], [place] [address]  Comprehensive metabolic panel     Status: Abnormal   Collection Time: 09/23/22 10:31 PM  Result Value Ref Range   Sodium 132 (L) 135 - 145 mmol/L   Potassium 4.0 3.5 - 5.1 mmol/L   Chloride 94 (L) 98 - 111 mmol/L   CO2 24 22 - 32 mmol/L   Glucose, Bld 127 (H) 70 - 99 mg/dL    Comment: Glucose reference range applies only to samples taken after fasting for at least 8 hours.   BUN 20 6 - 20 mg/dL   Creatinine, Ser 2.04 (H) 0.61 - 1.24 mg/dL   Calcium 8.5 (L) 8.9 - 10.3 mg/dL   Total Protein 7.2 6.5 - 8.1 g/dL   Albumin 3.0 (L) 3.5 - 5.0 g/dL   AST 63 (H) 15 - 41 U/L   ALT 26 0 - 44 U/L   Alkaline Phosphatase 54 38 - 126 U/L   Total Bilirubin 0.9 0.3 - 1.2 mg/dL   GFR, Estimated 43 (L)  >60 mL/min    Comment: (NOTE) Calculated using the CKD-EPI Creatinine Equation (2021)    Anion gap 14 5 - 15    Comment: Performed at [facility], [place] [address], [place], [place] [address]  Resp panel by RT-PCR (RSV, Flu A&B, Covid) Anterior Nasal Swab     Status: None   Collection Time: 09/23/22 11:47 PM   Specimen: Anterior Nasal Swab  Result Value Ref Range   SARS Coronavirus 2 by RT PCR NEGATIVE NEGATIVE   Influenza A by PCR NEGATIVE NEGATIVE   Influenza B by PCR NEGATIVE NEGATIVE    Comment: (NOTE) The Xpert Xpress SARS-CoV-2/FLU/RSV plus assay is intended as an aid in the diagnosis of influenza from Nasopharyngeal swab specimens and should not be used as a sole basis for treatment. Nasal washings and aspirates are unacceptable for Xpert Xpress SARS-CoV-2/FLU/RSV testing.  Fact Sheet for Patients: EntrepreneurPulse.com.au  Fact Sheet for Healthcare Providers: IncredibleEmployment.be  This test is not yet approved or cleared by the [place] FDA and has been authorized for detection and/or diagnosis of SARS-CoV-2 by FDA under an Emergency Use Authorization (EUA). This EUA will remain in effect (meaning this test can be used) for the duration of the COVID-19 declaration under Section 564(b)(1) of the Act, 21 U.S.C. section 360bbb-3(b)(1), unless the authorization is terminated or revoked.     Resp Syncytial Virus by PCR NEGATIVE NEGATIVE    Comment: (NOTE) Fact Sheet for Patients: EntrepreneurPulse.com.au  Fact Sheet for Healthcare Providers: IncredibleEmployment.be  This test is not yet approved or cleared by the [place] FDA and has been authorized for detection and/or diagnosis of SARS-CoV-2 by FDA under an Emergency Use Authorization (EUA). This EUA will remain in effect (meaning this test can be used) for the duration of the COVID-19 declaration under Section 564(b)(1) of the  Act, 21 U.S.C. section 360bbb-3(b)(1), unless the authorization is terminated or revoked.  Performed at [facility], [place] [address] [place], [place] [address]   Lactic acid, plasma     Status: Abnormal   Collection Time: 09/23/22 11:48 PM  Result Value Ref Range   Lactic Acid, Venous 2.2 (HH) 0.5 - 1.9 mmol/L    Comment: CRITICAL RESULT CALLED TO, READ BACK BY AND VERIFIED WITH Daphine Deutscher [facility] RN 09/24/22 0034 Wiliam Ke Performed at [facility], [place] [address], [place], [place] [address]   Protime-INR     Status: None  Collection Time: 09/23/22 11:48 PM  Result Value Ref Range   Prothrombin Time 14.7 11.4 - 15.2 seconds   INR 1.2 0.8 - 1.2    Comment: (NOTE) INR goal varies based on device and disease states. Performed at [facility], [place] [address] [place], [place] [address]   APTT     Status: None   Collection Time: 09/23/22 11:48 PM  Result Value Ref Range   aPTT 32 24 - 36 seconds    Comment: Performed at [facility] [address] [place], [place] [address]  Urinalysis, w/ Reflex to Culture (Infection Suspected) -Urine, Clean Catch     Status: Abnormal   Collection Time: 09/24/22 12:23 AM  Result Value Ref Range   Specimen Source URINE, CLEAN CATCH    Color, Urine AMBER (A) YELLOW    Comment: BIOCHEMICALS MAY BE AFFECTED BY COLOR   APPearance CLOUDY (A) CLEAR   Specific Gravity, Urine 1.025 1.005 - 1.030   pH 5.0 5.0 - 8.0   Glucose, UA NEGATIVE NEGATIVE mg/dL   Hgb urine dipstick LARGE (A) NEGATIVE   Bilirubin Urine NEGATIVE NEGATIVE   Ketones, ur NEGATIVE NEGATIVE mg/dL   Protein, ur >=300 (A) NEGATIVE mg/dL   Nitrite NEGATIVE NEGATIVE   Leukocytes,Ua NEGATIVE NEGATIVE   RBC / HPF 6-10 0 - 5 RBC/hpf   WBC, UA 6-10 0 - 5 WBC/hpf    Comment:        Reflex urine culture not performed if WBC <=10, OR if Squamous epithelial cells >5. If Squamous epithelial cells >5 suggest recollection.    Bacteria, UA FEW (A) NONE SEEN    Squamous Epithelial / HPF 0-5 0 - 5 /HPF   Mucus PRESENT    Hyaline Casts, UA PRESENT    Granular Casts, UA PRESENT    Non Squamous Epithelial 0-5 (A) NONE SEEN    Comment: Performed at [facility], [place] [address] [place], [place] [address]  Lactic acid, plasma     Status: None   Collection Time: 09/24/22  1:20 AM  Result Value Ref Range   Lactic Acid, Venous 1.5 0.5 - 1.9 mmol/L    Comment: Performed at [facility] [address] [place], [place] [address]   DG Chest 2 View  Result Date: 09/23/2022 CLINICAL DATA:  Cough and congestion EXAM: CHEST - 2 VIEW COMPARISON:  None Available. FINDINGS: Right medial lung base consolidation. No pleural effusion or pneumothorax. Cardiomediastinal contours are normal. IMPRESSION: Right medial lung base consolidation, concerning for pneumonia. Electronically Signed   By: Ulyses Jarred M.D.   On: 09/23/2022 22:53    Pending Labs Unresulted Labs (From admission, onward)     Start     Ordered   09/24/22 0332  Procalcitonin  Once,   R       References:    Procalcitonin Lower Respiratory Tract Infection AND Sepsis Procalcitonin Algorithm   09/24/22 0331   09/24/22 0329  Legionella Pneumophila Serogp 1 Ur Ag  Once,   R        09/24/22 0329   09/24/22 0329  Strep pneumoniae urinary antigen  Once,   R        09/24/22 0329   09/24/22 0329  Urinalysis, w/ Reflex to Culture (Infection Suspected) -Urine, Clean Catch  (Urine Culture)  Once,   R       Question:  Specimen Source  Answer:  Urine, Clean Catch   09/24/22 0329   09/24/22 [id number]  CBC with Differential/Platelet  Once,   R        09/24/22 0329   09/24/22 0327  HIV Antibody (routine testing w rflx)  (HIV Antibody (Routine testing w reflex) panel)  Once,   R        09/24/22 0328   09/24/22 0327  CBC  (heparin)  Once,   R       Comments: Baseline for heparin therapy IF NOT ALREADY DRAWN.  Notify MD if PLT < 100 K.    09/24/22 0329   09/24/22 0327  Creatinine, serum  (heparin)  Once,    R       Comments: Baseline for heparin therapy IF NOT ALREADY DRAWN.    09/24/22 0329   09/23/22 2331  Blood Culture (routine x 2)  (Septic presentation on arrival (screening labs, nursing and treatment orders for obvious sepsis))  BLOOD CULTURE X 2,   STAT      09/23/22 2331            Vitals/Pain Today's Vitals   09/24/22 0215 09/24/22 0242 09/24/22 0315 09/24/22 0315  BP: 96/62 95/62 (!) 91/56   Pulse:  83 86   Resp: 16 16 20    Temp:  98 F (36.7 C) 98.3 F (36.8 C)   TempSrc:  Oral Oral   SpO2:   99%   Weight:  69.1 kg    Height:  5\' 10"  (1.778 m)    PainSc:  0-No pain 0-No pain 0-No pain    Isolation Precautions No active isolations  Medications Medications  lactated ringers infusion (0 mLs Intravenous Stopped 09/24/22 0334)  cefTRIAXone (ROCEPHIN) 2 g in sodium chloride 0.9 % 100 mL IVPB (0 g Intravenous Stopped 09/24/22 0031)  azithromycin (ZITHROMAX) 500 mg in sodium chloride 0.9 % 250 mL IVPB (0 mg Intravenous Stopped 09/24/22 0148)  azithromycin (ZITHROMAX) 500 mg in sodium chloride 0.9 % 250 mL IVPB (has no administration in time range)  hydrOXYzine (ATARAX) tablet 25 mg (has no administration in time range)  OLANZapine (ZYPREXA) tablet 15 mg (has no administration in time range)  heparin injection 5,000 Units (has no administration in time range)  lactated ringers infusion ( Intravenous New Bag/Given 09/24/22 0335)  lactated ringers bolus 1,000 mL (0 mLs Intravenous Stopped 09/24/22 0031)    And  lactated ringers bolus 1,000 mL (0 mLs Intravenous Stopped 09/24/22 0148)    And  lactated ringers bolus 500 mL (0 mLs Intravenous Stopped 09/24/22 0148)  acetaminophen (TYLENOL) tablet 1,000 mg (1,000 mg Oral Given 09/23/22 2355)    Mobility walks     Focused Assessments Pulmonary Assessment Handoff:  Lung sounds:   O2 Device: Nasal Cannula O2 Flow Rate (L/min): 2 L/min    R Recommendations: See Admitting Provider Note  Report given to:   Additional Notes:  Patient is alert oriented uses urinal ,

## 2022-09-24 NOTE — Progress Notes (Signed)
Patient seen and examined.  Admitted early morning hours by nighttime hospitalist as below.  Currently patient remains a stable.  He complains of some questionable right back pain otherwise denies any complaints.  Afebrile overnight.  Blood pressures are adequate.  On room air.  In brief, 37 year old with history of polysubstance abuse, schizophrenia presents to the ER with about 6 days of cough, low-grade fever and shortness of breath.  No sick contacts.  In the emergency room initially blood pressure overload responded to IV fluids.  93% on room air.  WC count 15.9.  Chest x-ray with right medial lung base consolidation concerning for pneumonia.  Respiratory panel negative.  Treated with IV fluids, antibiotics and admitted to the hospital for treatment of pneumonia.  Agree with admission given severity of symptoms. Antibiotics to treat bacterial pneumonia.  Continue Rocephin and azithromycin. Chest physiotherapy, incentive spirometry, deep breathing exercises, sputum induction, mucolytic's and bronchodilators. Sputum cultures, blood cultures, Legionella and streptococcal antigen.   Acute kidney injury: Recurrent problem.  Maintenance IV fluid today.  Already trending down.  Recheck tomorrow morning.   Time spent: 35 minutes.  Same-day admit.  No charge visit.

## 2022-09-24 NOTE — TOC Initial Note (Signed)
Transition of Care [facility]) - Initial/Assessment Note    Patient Details  Name: Anthony Wiggins MRN: [id number] Date of Birth: July 07, 1985  [facility] CM/SW Contact:    Zenon Mayo, RN Phone Number: 09/24/2022, 4:39 PM  Clinical Narrative:                 NCM spoke with patient at the bedside, he states he lives at home with his mom.  He is indep.he has insurance, and Medicaid, co pay for meds is around 4.00.  presents with PNA, conts on iv abx. TOC following.  Expected Discharge Plan: Home/Self Care Barriers to Discharge: Continued Medical Work up   Patient Goals and CMS Choice Patient states their goals for this hospitalization and ongoing recovery are:: return home with mom   Choice offered to / list presented to : NA      Expected Discharge Plan and Services In-house Referral: NA Discharge Planning Services: CM Consult Post Acute Care Choice: NA Living arrangements for the past 2 months: Single Family Home                 DME Arranged: N/A                    Prior Living Arrangements/Services Living arrangements for the past 2 months: Single Family Home Lives with:: Parents Patient language and need for interpreter reviewed:: Yes Do you feel safe going back to the place where you live?: Yes      Need for Family Participation in Patient Care: Yes (Comment) Care giver support system in place?: Yes (comment)   Criminal Activity/Legal Involvement Pertinent to Current Situation/Hospitalization: No - Comment as needed  Activities of Daily Living Home Assistive Devices/Equipment: None ADL Screening (condition at time of admission) Patient's cognitive ability adequate to safely complete daily activities?: Yes Is the patient deaf or have difficulty hearing?: No Does the patient have difficulty seeing, even when wearing glasses/contacts?: No Does the patient have difficulty concentrating, remembering, or making decisions?: No Patient able to  express need for assistance with ADLs?: Yes Does the patient have difficulty dressing or bathing?: No Independently performs ADLs?: Yes (appropriate for developmental age) Does the patient have difficulty walking or climbing stairs?: No Weakness of Legs: None Weakness of Arms/Hands: None  Permission Sought/Granted                  Emotional Assessment Appearance:: Appears stated age Attitude/Demeanor/Rapport: Engaged Affect (typically observed): Appropriate Orientation: : Oriented to Self, Oriented to Place, Oriented to  Time, Oriented to Situation Alcohol / Substance Use: Not Applicable Psych Involvement: No (comment)  Admission diagnosis:  Dehydration [E86.0] CAP (community acquired pneumonia) [J18.9] AKI (acute kidney injury) [N17.9] Sepsis with acute renal failure without septic shock, due to unspecified organism, unspecified acute renal failure type [A41.9, R65.20, N17.9] Patient Active Problem List   Diagnosis Date Noted   CAP (community acquired pneumonia) 09/24/2022   Psychoactive substance-induced psychosis 05/07/2020   Elevated blood pressure reading 05/07/2020   Tachycardia 05/07/2020   Tobacco use disorder, moderate, dependence 05/07/2020   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   Methamphetamine abuse 05/07/2020   PCP:  Patient, No Pcp Per Pharmacy:   [id number] - [place], [place] [address] Phone: [phone] Fax: [phone]     Social Determinants of Health (SDOH) Social History: SDOH Screenings   Food Insecurity:  No Food Insecurity (09/24/2022)  Housing: Low Risk  (09/24/2022)  Transportation Needs: No Transportation Needs (09/24/2022)  Utilities: Not At Risk (09/24/2022)  Alcohol Screen: Low Risk  (05/07/2020)  Depression (PHQ2-9): Low Risk  (06/10/2022)  Tobacco Use: High Risk (09/24/2022)   SDOH Interventions:      Readmission Risk Interventions    09/24/2022    4:37 PM  Readmission Risk Prevention Plan  Transportation Screening Complete  Medication Review ([place]) Complete  PCP or Specialist appointment within 3-5 days of discharge Complete  HRI or [place] Complete  [place] Not Applicable

## 2022-09-24 NOTE — H&P (Addendum)
History and Physical    JONEL PROUTY [id number] DOB: Dec 02, 1985 DOA: 09/23/2022  PCP: Patient, No Pcp Per  Patient coming from: home  I have personally briefly reviewed patient's old medical records in [place]  Chief Complaint: fever/cough x 6 days  HPI: KNOWLTON ENNIS is a 37 y.o. male with medical history significant of polysubstance abuse( (methamphetamine, cocaine, THC ), dental caries, schizophrenia who presents to ED with complaint of Malaise, fever/cough, sob x 6 days. He notes not sick contacts, notes no chest pain , n/v/d or abdominal pain.  Notes feels improved since treatment in ED   ED Course:  Afeb bp 98/53 85/60-101/62, hr 120 rr 18, sat 93% on ra  Wbc 15.9, hgb 12.7 , mcv 79.2, plt 288,  Na 132, K 4 , Glu 127, cr 2.04 ( 1.19)  ast 63 [id number] medial lung base consolidation, concerning for pneumonia.  Resp panel neg  UA: large blood, few bacteria  Lactic 2.2,1.5 Tx tylenol 1g, zithromax 500mg   LR 1000L ml, ctx  azithromycin  Review of Systems: As per HPI otherwise 10 point review of systems negative.   Past Medical History:  Diagnosis Date   Dental caries    Schizophrenia     History reviewed. No pertinent surgical history.   reports that he has been smoking cigarettes. He has been smoking an average of .5 packs per day. He has never used smokeless tobacco. He reports current alcohol use of about 4.0 standard drinks of alcohol per week. He reports current drug use.  Allergies  Allergen Reactions   Fish Allergy Nausea And Vomiting   Naproxen Rash    Family History  Problem Relation Age of Onset   Diabetes Mother    Hypertension Mother     Prior to Admission medications   Medication Sig Start Date End Date Taking? Authorizing Provider  hydrOXYzine (ATARAX) 25 MG tablet Take 1 tablet (25 mg total) by mouth every 6 (six) hours. Patient taking differently: Take 25 mg by mouth every 4 (four) hours as needed for anxiety. 06/20/22  Yes Rankin,  Shuvon B, NP  OLANZapine (ZYPREXA) 15 MG tablet Take 1 tablet (15 mg total) by mouth daily with supper. Patient taking differently: Take 15 mg by mouth at bedtime. 06/20/22  Yes Rankin, Shuvon B, NP  Pseudoeph-CPM-DM-APAP (TYLENOL COLD & FLU DAY/NIGHT PO) Take 1 tablet by mouth daily as needed (For cold symptoms).   Yes [provider]    Physical Exam: Vitals:   09/24/22 0200 09/24/22 0207 09/24/22 0215 09/24/22 0242  BP: (!) 93/59  96/62 95/62  Pulse:    83  Resp: 11  16 16   Temp:    98 F (36.7 C)  TempSrc:    Oral  SpO2:      Weight:  69.1 kg  69.1 kg  Height:    5\' 10"  (1.778 m)    Constitutional: NAD, calm, comfortable Vitals:   09/24/22 0200 09/24/22 0207 09/24/22 0215 09/24/22 0242  BP: (!) 93/59  96/62 95/62  Pulse:    83  Resp: 11  16 16   Temp:    98 F (36.7 C)  TempSrc:    Oral  SpO2:      Weight:  69.1 kg  69.1 kg  Height:    5\' 10"  (1.778 m)   Eyes: PERRL, lids and conjunctivae normal ENMT: Mucous membranes are moist. Posterior pharynx clear of any exudate or lesions.poor dentition.  Neck: normal, supple, no masses, no thyromegaly Respiratory:  clear to auscultation bilaterally, no wheezing, no crackles. Normal respiratory effort. No accessory muscle use.  Cardiovascular: Regular rate and rhythm, no murmurs / rubs / gallops. No extremity edema. 2+ pedal pulses.  Abdomen: no tenderness, no masses palpated. No hepatosplenomegaly. Bowel sounds positive.  Musculoskeletal: no clubbing / cyanosis. No joint deformity upper and lower extremities. Good ROM, no contractures. Normal muscle tone.  Skin: no rashes, lesions, ulcers. No induration Neurologic: CN 2-12 grossly intact. Sensation intact,  Strength 5/5 in all 4.  Psychiatric: Normal judgment and insight. Alert and oriented x 3. Normal mood.    Labs on Admission: I have personally reviewed following labs and imaging studies  CBC: Recent Labs  Lab 09/23/22 2231  WBC 15.9*  NEUTROABS 12.7*  HGB  12.7*  HCT 38.0*  MCV 79.2*  PLT [id number]   Basic Metabolic Panel: Recent Labs  Lab 09/23/22 2231  NA 132*  K 4.0  CL 94*  CO2 24  GLUCOSE 127*  BUN 20  CREATININE 2.04*  CALCIUM 8.5*   GFR: Estimated Creatinine Clearance: 48.9 mL/min (A) (by C-G formula based on SCr of 2.04 mg/dL (H)). Liver Function Tests: Recent Labs  Lab 09/23/22 2231  AST 63*  ALT 26  ALKPHOS 54  BILITOT 0.9  PROT 7.2  ALBUMIN 3.0*   No results for input(s): "LIPASE", "AMYLASE" in the last 168 hours. No results for input(s): "AMMONIA" in the last 168 hours. Coagulation Profile: Recent Labs  Lab 09/23/22 2348  INR 1.2   Cardiac Enzymes: No results for input(s): "CKTOTAL", "CKMB", "CKMBINDEX", "TROPONINI" in the last 168 hours. BNP (last 3 results) No results for input(s): "PROBNP" in the last 8760 hours. HbA1C: No results for input(s): "HGBA1C" in the last 72 hours. CBG: No results for input(s): "GLUCAP" in the last 168 hours. Lipid Profile: No results for input(s): "CHOL", "HDL", "LDLCALC", "TRIG", "CHOLHDL", "LDLDIRECT" in the last 72 hours. Thyroid Function Tests: No results for input(s): "TSH", "T4TOTAL", "FREET4", "T3FREE", "THYROIDAB" in the last 72 hours. Anemia Panel: No results for input(s): "VITAMINB12", "FOLATE", "FERRITIN", "TIBC", "IRON", "RETICCTPCT" in the last 72 hours. Urine analysis:    Component Value Date/Time   COLORURINE AMBER (A) 09/24/2022 0023   APPEARANCEUR CLOUDY (A) 09/24/2022 0023   LABSPEC 1.025 09/24/2022 0023   PHURINE 5.0 09/24/2022 0023   GLUCOSEU NEGATIVE 09/24/2022 0023   HGBUR LARGE (A) 09/24/2022 0023   BILIRUBINUR NEGATIVE 09/24/2022 0023   KETONESUR NEGATIVE 09/24/2022 0023   PROTEINUR >=300 (A) 09/24/2022 0023   UROBILINOGEN 0.2 11/01/2012 1429   NITRITE NEGATIVE 09/24/2022 0023   LEUKOCYTESUR NEGATIVE 09/24/2022 0023    Radiological Exams on Admission: DG Chest 2 View  Result Date: 09/23/2022 CLINICAL DATA:  Cough and congestion EXAM:  CHEST - 2 VIEW COMPARISON:  None Available. FINDINGS: Right medial lung base consolidation. No pleural effusion or pneumothorax. Cardiomediastinal contours are normal. IMPRESSION: Right medial lung base consolidation, concerning for pneumonia. Electronically Signed   By: Ulyses Jarred M.D.   On: 09/23/2022 22:53    EKG: Independently reviewed.    Assessment/Plan  CAP with Sepsis -hx of dental carries and polysubstance abuse -patient with increase wbc and  Opacities on cxr  -ctx/ azithromycin, de-escalate as able  -pulmonary toilet  -urine ag, sputum, f/u on culture data  -continue on ivfs   AKI -hold nephrotoxic medications  -due to sepsis/ volume status  - ivfs  -monitor I/o and labs   Schizophrenia  - resume olanzapine   Hx of polysubstance abuse -uds pending  -  hx of methamphetamine, cocaine, THC     DVT prophylaxis: heparin Code Status: full/ as discussed per patient wishes in event of cardiac arrest  Family Communication: none at bedside  Disposition Plan: patient  expected to be admitted greater than 2 midnights  Consults called: .n/a Admission status: progressive   Clance Boll MD [facility]   If 7PM-7AM, please contact night-coverage [email or web address] Password [email or web address]  09/24/2022, 2:50 AM

## 2022-09-24 NOTE — Progress Notes (Signed)
PHARMACY - PHYSICIAN COMMUNICATION CRITICAL VALUE ALERT - BLOOD CULTURE IDENTIFICATION (BCID)  Anthony Wiggins is an 37 y.o. male who presented to [facility] on 09/23/2022 with a chief complaint of pneumonia  Assessment:  CAP with strep pneumonia bacteremia  Current ABX ceftriaxone 2gm IV q24h - continue   Name of physician (or Provider) Contacted:  Dr Sloan Leiter with [place].D. CPP, BCPS Clinical Pharmacist [phone] 09/24/2022 4:24 PM    Results for orders placed or performed during the hospital encounter of 09/23/22  Blood Culture ID Panel (Reflexed) (Collected: 09/23/2022 11:35 PM)  Result Value Ref Range   Enterococcus faecalis NOT DETECTED NOT DETECTED   Enterococcus Faecium NOT DETECTED NOT DETECTED   Listeria monocytogenes NOT DETECTED NOT DETECTED   Staphylococcus species NOT DETECTED NOT DETECTED   Staphylococcus aureus (BCID) NOT DETECTED NOT DETECTED   Staphylococcus epidermidis NOT DETECTED NOT DETECTED   Staphylococcus lugdunensis NOT DETECTED NOT DETECTED   Streptococcus species DETECTED (A) NOT DETECTED   Streptococcus agalactiae NOT DETECTED NOT DETECTED   Streptococcus pneumoniae DETECTED (A) NOT DETECTED   Streptococcus pyogenes NOT DETECTED NOT DETECTED   A.calcoaceticus-baumannii NOT DETECTED NOT DETECTED   Bacteroides fragilis NOT DETECTED NOT DETECTED   Enterobacterales NOT DETECTED NOT DETECTED   Enterobacter cloacae complex NOT DETECTED NOT DETECTED   Escherichia coli NOT DETECTED NOT DETECTED   Klebsiella aerogenes NOT DETECTED NOT DETECTED   Klebsiella oxytoca NOT DETECTED NOT DETECTED   Klebsiella pneumoniae NOT DETECTED NOT DETECTED   Proteus species NOT DETECTED NOT DETECTED   Salmonella species NOT DETECTED NOT DETECTED   Serratia marcescens NOT DETECTED NOT DETECTED   Haemophilus influenzae NOT DETECTED NOT DETECTED   Neisseria meningitidis NOT DETECTED NOT DETECTED   Pseudomonas aeruginosa NOT DETECTED NOT DETECTED    Stenotrophomonas maltophilia NOT DETECTED NOT DETECTED   Candida albicans NOT DETECTED NOT DETECTED   Candida auris NOT DETECTED NOT DETECTED   Candida glabrata NOT DETECTED NOT DETECTED   Candida krusei NOT DETECTED NOT DETECTED   Candida parapsilosis NOT DETECTED NOT DETECTED   Candida tropicalis NOT DETECTED NOT DETECTED   Cryptococcus neoformans/gattii NOT DETECTED NOT DETECTED

## 2022-09-25 ENCOUNTER — Inpatient Hospital Stay (HOSPITAL_COMMUNITY): Payer: 59

## 2022-09-25 DIAGNOSIS — J154 Pneumonia due to other streptococci: Secondary | ICD-10-CM

## 2022-09-25 DIAGNOSIS — R7881 Bacteremia: Secondary | ICD-10-CM

## 2022-09-25 LAB — ECHOCARDIOGRAM COMPLETE
AR max vel: 3.18 cm2
AV Area VTI: 2.52 cm2
AV Area mean vel: 2.95 cm2
AV Mean grad: 3 mmHg
AV Peak grad: 5.4 mmHg
Ao pk vel: 1.16 m/s
Area-P 1/2: 4.06 cm2
Height: 70 in
S' Lateral: 3.3 cm
Weight: 2641.6 oz

## 2022-09-25 LAB — CBC WITH DIFFERENTIAL/PLATELET
Abs Immature Granulocytes: 0.05 10*3/uL (ref 0.00–0.07)
Basophils Absolute: 0 10*3/uL (ref 0.0–0.1)
Basophils Relative: 0 %
Eosinophils Absolute: 0.1 10*3/uL (ref 0.0–0.5)
Eosinophils Relative: 1 %
HCT: 29.8 % — ABNORMAL LOW (ref 39.0–52.0)
Hemoglobin: 9.9 g/dL — ABNORMAL LOW (ref 13.0–17.0)
Immature Granulocytes: 1 %
Lymphocytes Relative: 22 %
Lymphs Abs: 2.1 10*3/uL (ref 0.7–4.0)
MCH: 26.4 pg (ref 26.0–34.0)
MCHC: 33.2 g/dL (ref 30.0–36.0)
MCV: 79.5 fL — ABNORMAL LOW (ref 80.0–100.0)
Monocytes Absolute: 0.6 10*3/uL (ref 0.1–1.0)
Monocytes Relative: 6 %
Neutro Abs: 7 10*3/uL (ref 1.7–7.7)
Neutrophils Relative %: 70 %
Platelets: 251 10*3/uL (ref 150–400)
RBC: 3.75 MIL/uL — ABNORMAL LOW (ref 4.22–5.81)
RDW: 14 % (ref 11.5–15.5)
WBC: 9.9 10*3/uL (ref 4.0–10.5)
nRBC: 0 % (ref 0.0–0.2)

## 2022-09-25 LAB — BASIC METABOLIC PANEL
Anion gap: 9 (ref 5–15)
BUN: 13 mg/dL (ref 6–20)
CO2: 26 mmol/L (ref 22–32)
Calcium: 7.9 mg/dL — ABNORMAL LOW (ref 8.9–10.3)
Chloride: 100 mmol/L (ref 98–111)
Creatinine, Ser: 1.13 mg/dL (ref 0.61–1.24)
GFR, Estimated: >60 mL/min (ref >60)
Glucose, Bld: 111 mg/dL — ABNORMAL HIGH (ref 70–99)
Potassium: 3.9 mmol/L (ref 3.5–5.1)
Sodium: 135 mmol/L (ref 135–145)

## 2022-09-25 LAB — MAGNESIUM: Magnesium: 2 mg/dL (ref 1.7–2.4)

## 2022-09-25 LAB — PHOSPHORUS: Phosphorus: 1.7 mg/dL — ABNORMAL LOW (ref 2.5–4.6)

## 2022-09-25 MED ORDER — POTASSIUM & SODIUM PHOSPHATES 280-160-250 MG PO PACK
1.0000 | PACK | Freq: Three times a day (TID) | ORAL | Status: DC
  Administered 2022-09-25 – 2022-09-26 (×4): 1 via ORAL
  Filled 2022-09-25 (×5): qty 1

## 2022-09-25 NOTE — Progress Notes (Signed)
Echocardiogram 2D Echocardiogram has been performed.  Ronny Flurry 09/25/2022, 3:29 PM

## 2022-09-25 NOTE — Progress Notes (Signed)
PROGRESS NOTE    Anthony Wiggins  [id number] DOB: 06-03-1986 DOA: 09/23/2022 PCP: Patient, No Pcp Per    Brief Narrative:  37 year old with history of polysubstance abuse, schizophrenia presented to the ER with about 6 days of cough, low-grade fever and shortness of breath. No sick contacts. In the emergency room initially blood pressure low but responded to IV fluids. 93% on room air. WBC count 15.9. Chest x-ray with right medial lung base consolidation concerning for pneumonia. Respiratory panel negative. Treated with IV fluids, antibiotics and admitted to the hospital for treatment of pneumonia.  Urine antigen positive for Streptococcus pneumonia, blood cultures positive for Streptococcus pneumonia.   Assessment & Plan:   Right middle lobe pneumonia secondary to Streptococcus pneumonie Bacteremia due to strep pneumonia.  Agree with continuing IV antibiotics with Rocephin 2 g daily today. Chest physiotherapy, incentive spirometry, deep breathing exercises, sputum induction, mucolytic's and bronchodilators. No sputum production. Urine strep antigen and blood cultures positive for strep pneumonia. Repeat blood cultures 4/1, negative so far.   Echocardiogram today. If repeat blood cultures remain negative, echocardiogram without evidence of vegetation and culture sensitivity report from 3/31 available and sensitive to penicillin patient can go home on amoxicillin high-dose for 2 weeks tomorrow.  Acute kidney injury: Renal injury.  Already improving.  Discontinue IV fluids.  Monitor.  Hypophosphatemia: Replace phosphorus.  Schizophrenia in remission: Stable.  On Zyprexa at night.  Uses hydroxyzine for anxiety.    DVT prophylaxis: heparin injection 5,000 Units Start: 09/24/22 0600   Code Status: Full code Family Communication: None at the bedside Disposition Plan: Status is: Inpatient Remains inpatient appropriate because: IV antibiotics for bacteremia     Consultants:   None.  Infectious disease curbside consult  Procedures:  None  Antimicrobials:  Rocephin 3/31---   Subjective: Patient seen and examined.  Denies any complaints.  He has back pain laying in the bed all the time.  Asked him to get around well.  Wants to go home.  Remains afebrile. He asked me to discharge him today, I told him about the culture reports and if everything improving can go home tomorrow.  He is agreeable.  Objective: Vitals:   09/24/22 2000 09/25/22 0023 09/25/22 0720 09/25/22 1207  BP: 122/85 106/63 (!) 129/99 (!) 159/95  Pulse: 84 80 77 78  Resp: 18 18 18 18   Temp: 98.5 F (36.9 C) 98.5 F (36.9 C) 99 F (37.2 C) 98.4 F (36.9 C)  TempSrc: Oral Oral Oral Oral  SpO2:  97% 99% 100%  Weight:  74.9 kg    Height:        Intake/Output Summary (Last 24 hours) at 09/25/2022 1336 Last data filed at 09/25/2022 1011 Gross per 24 hour  Intake 240 ml  Output 650 ml  Net -410 ml   Filed Weights   09/24/22 0207 09/24/22 0242 09/25/22 0023  Weight: 69.1 kg 69.1 kg 74.9 kg    Examination:  General exam: Appears calm and comfortable  Respiratory system: No added sounds. Cardiovascular system: S1 & S2 heard, RRR. Gastrointestinal system: Abdomen is nondistended, soft and nontender. No organomegaly or masses felt. Normal bowel sounds heard. Central nervous system: Alert and oriented. No focal neurological deficits. Flat affect.    Data Reviewed: I have personally reviewed following labs and imaging studies  CBC: Recent Labs  Lab 09/23/22 2231 09/24/22 0349 09/25/22 0215  WBC 15.9* 13.6* 9.9  NEUTROABS 12.7* 11.1* 7.0  HGB 12.7* 9.9* 9.9*  HCT 38.0* 29.7* 29.8*  MCV  79.2* 79.4* 79.5*  PLT 288 209 [id number]   Basic Metabolic Panel: Recent Labs  Lab 09/23/22 2231 09/24/22 0349 09/25/22 0215  NA 132*  --  135  K 4.0  --  3.9  CL 94*  --  100  CO2 24  --  26  GLUCOSE 127*  --  111*  BUN 20  --  13  CREATININE 2.04* 1.56* 1.13  CALCIUM 8.5*  --  7.9*  MG   --   --  2.0  PHOS  --   --  1.7*   GFR: Estimated Creatinine Clearance: 93.3 mL/min (by C-G formula based on SCr of 1.13 mg/dL). Liver Function Tests: Recent Labs  Lab 09/23/22 2231  AST 63*  ALT 26  ALKPHOS 54  BILITOT 0.9  PROT 7.2  ALBUMIN 3.0*   No results for input(s): "LIPASE", "AMYLASE" in the last 168 hours. No results for input(s): "AMMONIA" in the last 168 hours. Coagulation Profile: Recent Labs  Lab 09/23/22 2348  INR 1.2   Cardiac Enzymes: No results for input(s): "CKTOTAL", "CKMB", "CKMBINDEX", "TROPONINI" in the last 168 hours. BNP (last 3 results) No results for input(s): "PROBNP" in the last 8760 hours. HbA1C: No results for input(s): "HGBA1C" in the last 72 hours. CBG: No results for input(s): "GLUCAP" in the last 168 hours. Lipid Profile: No results for input(s): "CHOL", "HDL", "LDLCALC", "TRIG", "CHOLHDL", "LDLDIRECT" in the last 72 hours. Thyroid Function Tests: No results for input(s): "TSH", "T4TOTAL", "FREET4", "T3FREE", "THYROIDAB" in the last 72 hours. Anemia Panel: No results for input(s): "VITAMINB12", "FOLATE", "FERRITIN", "TIBC", "IRON", "RETICCTPCT" in the last 72 hours. Sepsis Labs: Recent Labs  Lab 09/23/22 2348 09/24/22 0120 09/24/22 0349  PROCALCITON  --   --  5.78  LATICACIDVEN 2.2* 1.5  --     Recent Results (from the past 240 hour(s))  Blood Culture (routine x 2)     Status: Abnormal (Preliminary result)   Collection Time: 09/23/22 11:30 PM   Specimen: BLOOD  Result Value Ref Range Status   Specimen Description BLOOD RIGHT ANTECUBITAL  Final   Special Requests   Final    BOTTLES DRAWN AEROBIC AND ANAEROBIC Blood Culture adequate volume   Culture  Setup Time   Final    GRAM POSITIVE COCCI IN CHAINS ANAEROBIC BOTTLE ONLY CRITICAL VALUE NOTED.  VALUE IS CONSISTENT WITH PREVIOUSLY REPORTED AND CALLED VALUE. Performed at [facility], [place] [address], [place], [place] [address]    Culture STREPTOCOCCUS PNEUMONIAE (A)   Final   Report Status PENDING  Incomplete  Blood Culture (routine x 2)     Status: Abnormal (Preliminary result)   Collection Time: 09/23/22 11:35 PM   Specimen: BLOOD  Result Value Ref Range Status   Specimen Description BLOOD SITE NOT SPECIFIED  Final   Special Requests   Final    BOTTLES DRAWN AEROBIC AND ANAEROBIC Blood Culture adequate volume   Culture  Setup Time   Final    GRAM POSITIVE COCCI IN CHAINS IN BOTH AEROBIC AND ANAEROBIC BOTTLES CRITICAL RESULT CALLED TO, READ BACK BY AND VERIFIED WITH: PHARMD LISA CURRAN ON 09/24/22 @ 1612 BY DRT     Culture (A)  Final    STREPTOCOCCUS PNEUMONIAE STAPHYLOCOCCUS HOMINIS SUSCEPTIBILITIES TO FOLLOW Performed at [facility], [place] [address] [place], [place] [address]    Report Status PENDING  Incomplete  Blood Culture ID Panel (Reflexed)     Status: Abnormal   Collection Time: 09/23/22 11:35 PM  Result  Value Ref Range Status   Enterococcus faecalis NOT DETECTED NOT DETECTED Final   Enterococcus Faecium NOT DETECTED NOT DETECTED Final   Listeria monocytogenes NOT DETECTED NOT DETECTED Final   Staphylococcus species NOT DETECTED NOT DETECTED Final   Staphylococcus aureus (BCID) NOT DETECTED NOT DETECTED Final   Staphylococcus epidermidis NOT DETECTED NOT DETECTED Final   Staphylococcus lugdunensis NOT DETECTED NOT DETECTED Final   Streptococcus species DETECTED (A) NOT DETECTED Final    Comment: CRITICAL RESULT CALLED TO, READ BACK BY AND VERIFIED WITH: PHARMD LISA CURRAN ON 09/24/22 @ 1612 BY DRT     Streptococcus agalactiae NOT DETECTED NOT DETECTED Final   Streptococcus pneumoniae DETECTED (A) NOT DETECTED Final    Comment: CRITICAL RESULT CALLED TO, READ BACK BY AND VERIFIED WITH: PHARMD LISA CURRAN ON 09/24/22 @ 1612 BY DRT     Streptococcus pyogenes NOT DETECTED NOT DETECTED Final   A.calcoaceticus-baumannii NOT DETECTED NOT DETECTED Final   Bacteroides fragilis NOT DETECTED NOT DETECTED Final   Enterobacterales NOT  DETECTED NOT DETECTED Final   Enterobacter cloacae complex NOT DETECTED NOT DETECTED Final   Escherichia coli NOT DETECTED NOT DETECTED Final   Klebsiella aerogenes NOT DETECTED NOT DETECTED Final   Klebsiella oxytoca NOT DETECTED NOT DETECTED Final   Klebsiella pneumoniae NOT DETECTED NOT DETECTED Final   Proteus species NOT DETECTED NOT DETECTED Final   Salmonella species NOT DETECTED NOT DETECTED Final   Serratia marcescens NOT DETECTED NOT DETECTED Final   Haemophilus influenzae NOT DETECTED NOT DETECTED Final   Neisseria meningitidis NOT DETECTED NOT DETECTED Final   Pseudomonas aeruginosa NOT DETECTED NOT DETECTED Final   Stenotrophomonas maltophilia NOT DETECTED NOT DETECTED Final   Candida albicans NOT DETECTED NOT DETECTED Final   Candida auris NOT DETECTED NOT DETECTED Final   Candida glabrata NOT DETECTED NOT DETECTED Final   Candida krusei NOT DETECTED NOT DETECTED Final   Candida parapsilosis NOT DETECTED NOT DETECTED Final   Candida tropicalis NOT DETECTED NOT DETECTED Final   Cryptococcus neoformans/gattii NOT DETECTED NOT DETECTED Final    Comment: Performed at [facility], [place] [address] [place], [place] [address]  Resp panel by RT-PCR (RSV, Flu A&B, Covid) Anterior Nasal Swab     Status: None   Collection Time: 09/23/22 11:47 PM   Specimen: Anterior Nasal Swab  Result Value Ref Range Status   SARS Coronavirus 2 by RT PCR NEGATIVE NEGATIVE Final   Influenza A by PCR NEGATIVE NEGATIVE Final   Influenza B by PCR NEGATIVE NEGATIVE Final    Comment: (NOTE) The Xpert Xpress SARS-CoV-2/FLU/RSV plus assay is intended as an aid in the diagnosis of influenza from Nasopharyngeal swab specimens and should not be used as a sole basis for treatment. Nasal washings and aspirates are unacceptable for Xpert Xpress SARS-CoV-2/FLU/RSV testing.  Fact Sheet for Patients: EntrepreneurPulse.com.au  Fact Sheet for Healthcare  Providers: IncredibleEmployment.be  This test is not yet approved or cleared by the [place] FDA and has been authorized for detection and/or diagnosis of SARS-CoV-2 by FDA under an Emergency Use Authorization (EUA). This EUA will remain in effect (meaning this test can be used) for the duration of the COVID-19 declaration under Section 564(b)(1) of the Act, 21 U.S.C. section 360bbb-3(b)(1), unless the authorization is terminated or revoked.     Resp Syncytial Virus by PCR NEGATIVE NEGATIVE Final    Comment: (NOTE) Fact Sheet for Patients: EntrepreneurPulse.com.au  Fact Sheet for Healthcare Providers: IncredibleEmployment.be  This test is not yet approved  or cleared by the [place] and has been authorized for detection and/or diagnosis of SARS-CoV-2 by FDA under an Emergency Use Authorization (EUA). This EUA will remain in effect (meaning this test can be used) for the duration of the COVID-19 declaration under Section 564(b)(1) of the Act, 21 U.S.C. section 360bbb-3(b)(1), unless the authorization is terminated or revoked.  Performed at [facility], [place] [address] [place], [place] [address]   Culture, blood (Routine X 2) w Reflex to ID Panel     Status: None (Preliminary result)   Collection Time: 09/24/22  5:42 PM   Specimen: BLOOD RIGHT HAND  Result Value Ref Range Status   Specimen Description BLOOD RIGHT HAND  Final   Special Requests   Final    BOTTLES DRAWN AEROBIC AND ANAEROBIC Blood Culture adequate volume   Culture   Final    NO GROWTH < 24 HOURS Performed at [facility], [place] [address], [place], [place] [address]    Report Status PENDING  Incomplete  Culture, blood (Routine X 2) w Reflex to ID Panel     Status: None (Preliminary result)   Collection Time: 09/24/22  5:52 PM   Specimen: BLOOD RIGHT ARM  Result Value Ref Range Status   Specimen Description BLOOD RIGHT ARM  Final    Special Requests   Final    BOTTLES DRAWN AEROBIC AND ANAEROBIC Blood Culture results may not be optimal due to an inadequate volume of blood received in culture bottles   Culture   Final    NO GROWTH < 24 HOURS Performed at [facility], [place] [address] [place], [place] [address]    Report Status PENDING  Incomplete         Radiology Studies: DG Chest 2 View  Result Date: 09/23/2022 CLINICAL DATA:  Cough and congestion EXAM: CHEST - 2 VIEW COMPARISON:  None Available. FINDINGS: Right medial lung base consolidation. No pleural effusion or pneumothorax. Cardiomediastinal contours are normal. IMPRESSION: Right medial lung base consolidation, concerning for pneumonia. Electronically Signed   By: Ulyses Jarred M.D.   On: 09/23/2022 22:53        Scheduled Meds:  heparin  5,000 Units Subcutaneous Q8H   hydrOXYzine  25 mg Oral Q6H   OLANZapine  15 mg Oral Q supper   potassium & sodium phosphates  1 packet Oral TID WC & HS   Continuous Infusions:  cefTRIAXone (ROCEPHIN)  IV       LOS: 1 day    Time spent: 35 minutes    Barb Merino, MD [facility] Pager [phone]

## 2022-09-26 DIAGNOSIS — J189 Pneumonia, unspecified organism: Secondary | ICD-10-CM

## 2022-09-26 LAB — CULTURE, BLOOD (ROUTINE X 2)
Special Requests: ADEQUATE
Special Requests: ADEQUATE

## 2022-09-26 LAB — LEGIONELLA PNEUMOPHILA SEROGP 1 UR AG: L. pneumophila Serogp 1 Ur Ag: NEGATIVE

## 2022-09-26 MED ORDER — AMOXICILLIN 875 MG PO TABS: 875.0000 mg | ORAL_TABLET | Freq: Two times a day (BID) | ORAL | 0 refills | Status: AC

## 2022-09-26 NOTE — Discharge Instructions (Signed)
Advised to take amoxicillin 875 twice daily for 14 days for streptococci pneumonia.

## 2022-09-26 NOTE — Plan of Care (Signed)

## 2022-09-26 NOTE — TOC Transition Note (Signed)
Transition of Care [facility]) - CM/SW Discharge Note   Patient Details  Name: Anthony Wiggins MRN: [id number] Date of Birth: 09/10/1985  [facility] CM/SW Contact:  Zenon Mayo, RN Phone Number: 09/26/2022, 10:54 AM   Clinical Narrative:     Patient left AMA.     Barriers to Discharge: Continued Medical Work up   Patient Goals and CMS Choice   Choice offered to / list presented to : NA  Discharge Placement                         Discharge Plan and Services Additional resources added to the After Visit Summary for   In-house Referral: NA Discharge Planning Services: CM Consult Post Acute Care Choice: NA          DME Arranged: N/A                    Social Determinants of Health (SDOH) Interventions SDOH Screenings   Food Insecurity: No Food Insecurity (09/24/2022)  Housing: Low Risk  (09/24/2022)  Transportation Needs: No Transportation Needs (09/24/2022)  Utilities: Not At Risk (09/24/2022)  Alcohol Screen: Low Risk  (05/07/2020)  Depression (PHQ2-9): Low Risk  (06/10/2022)  Tobacco Use: High Risk (09/24/2022)     Readmission Risk Interventions    09/24/2022    4:37 PM  Readmission Risk Prevention Plan  Transportation Screening Complete  Medication Review ([place]) Complete  PCP or Specialist appointment within 3-5 days of discharge Complete  HRI or [place] Not Applicable

## 2022-09-26 NOTE — Discharge Summary (Signed)
Physician Discharge Summary  Anthony Wiggins [id number] DOB: Aug 16, 1985 DOA: 09/23/2022  PCP: Patient, No Pcp Per  Admit date: 09/23/2022  Discharge date: 09/26/2022  Admitted From: Home.  Disposition:  Home.  Recommendations for Outpatient Follow-up:  Follow up with PCP in 1-2 weeks. Please obtain BMP/CBC in one week. Advised to take amoxicillin 875 twice daily for 14 days for streptococci pneumonia.  Home Health: None Equipment/Devices:None  Discharge Condition: Stable CODE STATUS:Full code Diet recommendation: Heart Healthy   Brief [facility] Course: This 37 year old Male with history of polysubstance abuse, schizophrenia presented to the ER with about 6 days of cough, low-grade fever and shortness of breath. No sick contacts. In the emergency room initially blood pressure was low but responded to IV fluids. 93% on room air. WBC count 15.9. Chest x-ray with right medial lung base consolidation concerning for pneumonia. Respiratory panel negative. Treated with IV fluids, antibiotics and admitted to the hospital for treatment of pneumonia.  Urine antigen positive for Streptococcus pneumonia, blood cultures positive for Streptococcus pneumonia.  Repeat blood cultures negative.  Blood cultures were pansensitive.  Discussed with infectious diseases patient can be discharged on high-dose amoxicillin for 14 days.  Patient feels better and wanted to be discharged.  Patient is being discharged on amoxicillin 875 twice daily for 14 days.  Discharge Diagnoses:  Principal Problem:   CAP (community acquired pneumonia) Active Problems:   Bacteremia   Streptococcal pneumonia  Right middle lobe pneumonia secondary to Streptococcus pneumonie Bacteremia due to strep pneumonia.   Agree with continuing IV antibiotics with Rocephin 2 g daily today. Chest physiotherapy, incentive spirometry, deep breathing exercises, sputum induction, mucolytic's and bronchodilators. No sputum  production. Urine strep antigen and blood cultures positive for strep pneumonia. Repeat blood cultures 4/1, negative so far.   Echocardiogram : No vegetation.  LVEF 60 to 65%. If repeat blood cultures remain negative, echocardiogram without evidence of vegetation and culture sensitivity report from 3/31 available and sensitive to penicillin patient can go home on amoxicillin high-dose for 2 weeks . Patient is being discharged on amoxicillin for 14 days.   Acute kidney injury: Renal injury.  Resolved..  Discontinue IV fluids.  Monitor.   Hypophosphatemia: Replace phosphorus.   Schizophrenia in remission: Stable.  On Zyprexa at night.  Uses hydroxyzine for anxiety.  Discharge Instructions  Discharge Instructions     Call MD for:  difficulty breathing, headache or visual disturbances   Complete by: As directed    Call MD for:  persistant dizziness or light-headedness   Complete by: As directed    Call MD for:  persistant nausea and vomiting   Complete by: As directed    Diet - low sodium heart healthy   Complete by: As directed    Diet Carb Modified   Complete by: As directed    Discharge instructions   Complete by: As directed    Advised to follow-up with primary care physician in 1 week. Advised to take amoxicillin 875 twice daily for 14 days for streptococci pneumonia.   Increase activity slowly   Complete by: As directed       Allergies as of 09/26/2022       Reactions   Fish Allergy Nausea And Vomiting   Naproxen Rash        Medication List     TAKE these medications    amoxicillin 875 MG tablet Commonly known as: AMOXIL Take 1 tablet (875 mg total) by mouth 2 (two) times daily for 14 days.  hydrOXYzine 25 MG tablet Commonly known as: ATARAX Take 1 tablet (25 mg total) by mouth every 6 (six) hours. What changed:  when to take this reasons to take this   OLANZapine 15 MG tablet Commonly known as: ZYPREXA Take 1 tablet (15 mg total) by mouth daily with  supper. What changed: when to take this   TYLENOL COLD & FLU DAY/NIGHT PO Take 1 tablet by mouth daily as needed (For cold symptoms).        Follow-up Information     [facility] Follow up in 1 week(s).   Contact information: [place] [phone]               Allergies  Allergen Reactions   Fish Allergy Nausea And Vomiting   Naproxen Rash    Consultations: None   Procedures/Studies: ECHOCARDIOGRAM COMPLETE  Result Date: 09/25/2022    ECHOCARDIOGRAM REPORT   Patient Name:   Anthony Wiggins Date of Exam: 09/25/2022 Medical Rec #:  [id number]     Height:       70.0 in Accession #:    [id number]    Weight:       165.1 lb Date of Birth:  1985/09/23     BSA:          1.924 m Patient Age:    37 years      BP:           129/99 mmHg Patient Gender: M             HR:           71 bpm. Exam Location:  Inpatient Procedure: 2D Echo, Cardiac Doppler and Color Doppler Indications:    Bacteremia R78.81  History:        Patient has no prior history of Echocardiogram examinations.                 Arrythmias:Tachycardia; Risk Factors:Current Smoker.  Sonographer:    Ronny Flurry Referring Phys: [id number] [place]  1. Left ventricular ejection fraction, by estimation, is 55 to 60%. The left ventricle has normal function. The left ventricle has no regional wall motion abnormalities. Left ventricular diastolic parameters were normal.  2. Right ventricular systolic function is normal. The right ventricular size is normal.  3. The mitral valve is normal in structure. No evidence of mitral valve regurgitation. No evidence of mitral stenosis.  4. The aortic valve is tricuspid. Aortic valve regurgitation is not visualized.  5. The inferior vena cava is dilated in size with >50% respiratory variability, suggesting right atrial pressure of 8 mmHg. Comparison(s): No prior Echocardiogram. Conclusion(s)/Recommendation(s): No evidence of  valvular vegetations on this transthoracic echocardiogram. Consider a transesophageal echocardiogram to exclude infective endocarditis if clinically indicated. FINDINGS  Left Ventricle: Left ventricular ejection fraction, by estimation, is 55 to 60%. The left ventricle has normal function. The left ventricle has no regional wall motion abnormalities. The left ventricular internal cavity size was normal in size. There is  no left ventricular hypertrophy. Left ventricular diastolic parameters were normal. Right Ventricle: The right ventricular size is normal. No increase in right ventricular wall thickness. Right ventricular systolic function is normal. Left Atrium: Left atrial size was normal in size. Right Atrium: Right atrial size was normal in size. Pericardium: Trivial pericardial effusion is present. The pericardial effusion is posterior to the left ventricle. Mitral Valve: The mitral valve is normal in structure. No evidence of mitral valve regurgitation. No  evidence of mitral valve stenosis. Tricuspid Valve: The tricuspid valve is normal in structure. Tricuspid valve regurgitation is not demonstrated. No evidence of tricuspid stenosis. Aortic Valve: The aortic valve is tricuspid. Aortic valve regurgitation is not visualized. Aortic valve mean gradient measures 3.0 mmHg. Aortic valve peak gradient measures 5.4 mmHg. Aortic valve area, by VTI measures 2.52 cm. Pulmonic Valve: The pulmonic valve was normal in structure. Pulmonic valve regurgitation is trivial. No evidence of pulmonic stenosis. Aorta: The aortic root and ascending aorta are structurally normal, with no evidence of dilitation. Venous: The inferior vena cava is dilated in size with greater than 50% respiratory variability, suggesting right atrial pressure of 8 mmHg. IAS/Shunts: The atrial septum is grossly normal.  LEFT VENTRICLE PLAX 2D LVIDd:         4.60 cm   Diastology LVIDs:         3.30 cm   LV e' medial:    13.30 cm/s LV PW:         0.90 cm    LV E/e' medial:  5.3 LV IVS:        0.70 cm   LV e' lateral:   23.40 cm/s LVOT diam:     2.30 cm   LV E/e' lateral: 3.0 LV SV:         57 LV SV Index:   30 LVOT Area:     4.15 cm  RIGHT VENTRICLE             IVC RV S prime:     15.75 cm/s  IVC diam: 2.70 cm TAPSE (M-mode): 2.6 cm LEFT ATRIUM             Index        RIGHT ATRIUM           Index LA diam:        3.70 cm 1.92 cm/m   RA Area:     17.80 cm LA Vol (A2C):   42.2 ml 21.93 ml/m  RA Volume:   51.60 ml  26.82 ml/m LA Vol (A4C):   45.0 ml 23.39 ml/m LA Biplane Vol: 48.4 ml 25.16 ml/m  AORTIC VALVE AV Area (Vmax):    3.18 cm AV Area (Vmean):   2.95 cm AV Area (VTI):     2.52 cm AV Vmax:           116.00 cm/s AV Vmean:          79.400 cm/s AV VTI:            0.226 m AV Peak Grad:      5.4 mmHg AV Mean Grad:      3.0 mmHg LVOT Vmax:         88.80 cm/s LVOT Vmean:        56.433 cm/s LVOT VTI:          0.137 m LVOT/AV VTI ratio: 0.61  AORTA Ao Root diam: 3.70 cm Ao Asc diam:  3.00 cm MITRAL VALVE               TRICUSPID VALVE MV Area (PHT): 4.06 cm    TR Peak grad:   16.5 mmHg MV Decel Time: 187 msec    TR Vmax:        203.00 cm/s MV E velocity: 71.00 cm/s MV A velocity: 74.70 cm/s  SHUNTS MV E/A ratio:  0.95        Systemic VTI:  0.14 m  Systemic Diam: 2.30 cm Rudean Haskell MD Electronically signed by Rudean Haskell MD Signature Date/Time: 09/25/2022/5:52:33 PM    Final    DG Chest 2 View  Result Date: 09/23/2022 CLINICAL DATA:  Cough and congestion EXAM: CHEST - 2 VIEW COMPARISON:  None Available. FINDINGS: Right medial lung base consolidation. No pleural effusion or pneumothorax. Cardiomediastinal contours are normal. IMPRESSION: Right medial lung base consolidation, concerning for pneumonia. Electronically Signed   By: Ulyses Jarred M.D.   On: 09/23/2022 22:53    Subjective: Patient was seen and examined at bedside.  Overnight events noted.   Patient report doing much better. He wants to be discharged.   Patient being discharged home.  Discharge Exam: Vitals:   09/26/22 0357 09/26/22 0743  BP: (!) 143/97 (!) 143/96  Pulse: 69 98  Resp: 20 18  Temp: 98.4 F (36.9 C) 98.4 F (36.9 C)  SpO2: 100% 100%   Vitals:   09/25/22 1933 09/26/22 0022 09/26/22 0357 09/26/22 0743  BP: 119/73 (!) 121/93 (!) 143/97 (!) 143/96  Pulse: 71 71 69 98  Resp: 20 20 20 18   Temp: 98.4 F (36.9 C) 98.4 F (36.9 C) 98.4 F (36.9 C) 98.4 F (36.9 C)  TempSrc: Oral Oral Oral Oral  SpO2: 98%  100% 100%  Weight:   70.3 kg   Height:        General: Pt is alert, awake, not in acute distress Cardiovascular: RRR, S1/S2 +, no rubs, no gallops Respiratory: CTA bilaterally, no wheezing, no rhonchi Abdominal: Soft, NT, ND, bowel sounds + Extremities: no edema, no cyanosis    The results of significant diagnostics from this hospitalization (including imaging, microbiology, ancillary and laboratory) are listed below for reference.     Microbiology: Recent Results (from the past 240 hour(s))  Blood Culture (routine x 2)     Status: Abnormal   Collection Time: 09/23/22 11:30 PM   Specimen: BLOOD  Result Value Ref Range Status   Specimen Description BLOOD RIGHT ANTECUBITAL  Final   Special Requests   Final    BOTTLES DRAWN AEROBIC AND ANAEROBIC Blood Culture adequate volume   Culture  Setup Time   Final    GRAM POSITIVE COCCI IN CHAINS ANAEROBIC BOTTLE ONLY CRITICAL VALUE NOTED.  VALUE IS CONSISTENT WITH PREVIOUSLY REPORTED AND CALLED VALUE.    Culture (A)  Final    STREPTOCOCCUS PNEUMONIAE SUSCEPTIBILITIES PERFORMED ON PREVIOUS CULTURE WITHIN THE LAST 5 DAYS. Performed at [facility], [place] [address], [place], [place] [address]    Report Status 09/26/2022 FINAL  Final  Blood Culture (routine x 2)     Status: Abnormal   Collection Time: 09/23/22 11:35 PM   Specimen: BLOOD  Result Value Ref Range Status   Specimen Description BLOOD SITE NOT SPECIFIED  Final   Special Requests   Final     BOTTLES DRAWN AEROBIC AND ANAEROBIC Blood Culture adequate volume   Culture  Setup Time   Final    GRAM POSITIVE COCCI IN CHAINS IN BOTH AEROBIC AND ANAEROBIC BOTTLES CRITICAL RESULT CALLED TO, READ BACK BY AND VERIFIED WITH: PHARMD LISA [place] ON 09/24/22 @ 1612 BY DRT     Culture (A)  Final    STREPTOCOCCUS PNEUMONIAE STAPHYLOCOCCUS HOMINIS THE SIGNIFICANCE OF ISOLATING THIS ORGANISM FROM A SINGLE SET OF BLOOD CULTURES WHEN MULTIPLE SETS ARE DRAWN IS UNCERTAIN. PLEASE NOTIFY THE MICROBIOLOGY DEPARTMENT WITHIN ONE WEEK IF SPECIATION AND SENSITIVITIES ARE REQUIRED. Performed at [facility], [place] [address]  [place], [place] [address]    Report Status 09/26/2022 FINAL  Final   Organism ID, Bacteria STREPTOCOCCUS PNEUMONIAE  Final      Susceptibility   Streptococcus pneumoniae - MIC*    ERYTHROMYCIN >=8 RESISTANT Resistant     LEVOFLOXACIN 0.5 SENSITIVE Sensitive     VANCOMYCIN 0.5 SENSITIVE Sensitive     PENICILLIN (meningitis) <=0.06 SENSITIVE Sensitive     PENO - penicillin <=0.06      PENICILLIN (non-meningitis) <=0.06 SENSITIVE Sensitive     PENICILLIN (oral) <=0.06 SENSITIVE Sensitive     CEFTRIAXONE (non-meningitis) <=0.12 SENSITIVE Sensitive     CEFTRIAXONE (meningitis) <=0.12 SENSITIVE Sensitive     * STREPTOCOCCUS PNEUMONIAE  Blood Culture ID Panel (Reflexed)     Status: Abnormal   Collection Time: 09/23/22 11:35 PM  Result Value Ref Range Status   Enterococcus faecalis NOT DETECTED NOT DETECTED Final   Enterococcus Faecium NOT DETECTED NOT DETECTED Final   Listeria monocytogenes NOT DETECTED NOT DETECTED Final   Staphylococcus species NOT DETECTED NOT DETECTED Final   Staphylococcus aureus (BCID) NOT DETECTED NOT DETECTED Final   Staphylococcus epidermidis NOT DETECTED NOT DETECTED Final   Staphylococcus lugdunensis NOT DETECTED NOT DETECTED Final   Streptococcus species DETECTED (A) NOT DETECTED Final    Comment: CRITICAL RESULT CALLED TO, READ BACK BY AND VERIFIED  WITH: PHARMD LISA CURRAN ON 09/24/22 @ 1612 BY DRT     Streptococcus agalactiae NOT DETECTED NOT DETECTED Final   Streptococcus pneumoniae DETECTED (A) NOT DETECTED Final    Comment: CRITICAL RESULT CALLED TO, READ BACK BY AND VERIFIED WITH: PHARMD LISA CURRAN ON 09/24/22 @ 1612 BY DRT     Streptococcus pyogenes NOT DETECTED NOT DETECTED Final   A.calcoaceticus-baumannii NOT DETECTED NOT DETECTED Final   Bacteroides fragilis NOT DETECTED NOT DETECTED Final   Enterobacterales NOT DETECTED NOT DETECTED Final   Enterobacter cloacae complex NOT DETECTED NOT DETECTED Final   Escherichia coli NOT DETECTED NOT DETECTED Final   Klebsiella aerogenes NOT DETECTED NOT DETECTED Final   Klebsiella oxytoca NOT DETECTED NOT DETECTED Final   Klebsiella pneumoniae NOT DETECTED NOT DETECTED Final   Proteus species NOT DETECTED NOT DETECTED Final   Salmonella species NOT DETECTED NOT DETECTED Final   Serratia marcescens NOT DETECTED NOT DETECTED Final   Haemophilus influenzae NOT DETECTED NOT DETECTED Final   Neisseria meningitidis NOT DETECTED NOT DETECTED Final   Pseudomonas aeruginosa NOT DETECTED NOT DETECTED Final   Stenotrophomonas maltophilia NOT DETECTED NOT DETECTED Final   Candida albicans NOT DETECTED NOT DETECTED Final   Candida auris NOT DETECTED NOT DETECTED Final   Candida glabrata NOT DETECTED NOT DETECTED Final   Candida krusei NOT DETECTED NOT DETECTED Final   Candida parapsilosis NOT DETECTED NOT DETECTED Final   Candida tropicalis NOT DETECTED NOT DETECTED Final   Cryptococcus neoformans/gattii NOT DETECTED NOT DETECTED Final    Comment: Performed at [facility], [place] [address] [place],  [address]  Resp panel by RT-PCR (RSV, Flu A&B, Covid) Anterior Nasal Swab     Status: None   Collection Time: 09/23/22 11:47 PM   Specimen: Anterior Nasal Swab  Result Value Ref Range Status   SARS Coronavirus 2 by RT PCR NEGATIVE NEGATIVE Final   Influenza A by PCR NEGATIVE NEGATIVE  Final   Influenza B by PCR NEGATIVE NEGATIVE Final    Comment: (NOTE) The Xpert Xpress SARS-CoV-2/FLU/RSV plus assay is intended as an aid in the diagnosis of influenza from Nasopharyngeal swab specimens and  should not be used as a sole basis for treatment. Nasal washings and aspirates are unacceptable for Xpert Xpress SARS-CoV-2/FLU/RSV testing.  Fact Sheet for Patients: EntrepreneurPulse.com.au  Fact Sheet for Healthcare Providers: IncredibleEmployment.be  This test is not yet approved or cleared by the [place] FDA and has been authorized for detection and/or diagnosis of SARS-CoV-2 by FDA under an Emergency Use Authorization (EUA). This EUA will remain in effect (meaning this test can be used) for the duration of the COVID-19 declaration under Section 564(b)(1) of the Act, 21 U.S.C. section 360bbb-3(b)(1), unless the authorization is terminated or revoked.     Resp Syncytial Virus by PCR NEGATIVE NEGATIVE Final    Comment: (NOTE) Fact Sheet for Patients: EntrepreneurPulse.com.au  Fact Sheet for Healthcare Providers: IncredibleEmployment.be  This test is not yet approved or cleared by the [place] FDA and has been authorized for detection and/or diagnosis of SARS-CoV-2 by FDA under an Emergency Use Authorization (EUA). This EUA will remain in effect (meaning this test can be used) for the duration of the COVID-19 declaration under Section 564(b)(1) of the Act, 21 U.S.C. section 360bbb-3(b)(1), unless the authorization is terminated or revoked.  Performed at [facility], [place] [address], [place], [place] [address]   Culture, blood (Routine X 2) w Reflex to ID Panel     Status: None (Preliminary result)   Collection Time: 09/24/22  5:42 PM   Specimen: BLOOD RIGHT HAND  Result Value Ref Range Status   Specimen Description BLOOD RIGHT HAND  Final   Special Requests   Final    BOTTLES  DRAWN AEROBIC AND ANAEROBIC Blood Culture adequate volume   Culture   Final    NO GROWTH 2 DAYS Performed at [facility], [place] [address], [place], [place] [address]    Report Status PENDING  Incomplete  Culture, blood (Routine X 2) w Reflex to ID Panel     Status: None (Preliminary result)   Collection Time: 09/24/22  5:52 PM   Specimen: BLOOD RIGHT ARM  Result Value Ref Range Status   Specimen Description BLOOD RIGHT ARM  Final   Special Requests   Final    BOTTLES DRAWN AEROBIC AND ANAEROBIC Blood Culture results may not be optimal due to an inadequate volume of blood received in culture bottles   Culture   Final    NO GROWTH 2 DAYS Performed at [facility], [place] [address], [place],  [address]    Report Status PENDING  Incomplete     Labs: BNP (last 3 results) No results for input(s): "BNP" in the last 8760 hours. Basic Metabolic Panel: Recent Labs  Lab 09/23/22 2231 09/24/22 0349 09/25/22 0215  NA 132*  --  135  K 4.0  --  3.9  CL 94*  --  100  CO2 24  --  26  GLUCOSE 127*  --  111*  BUN 20  --  13  CREATININE 2.04* 1.56* 1.13  CALCIUM 8.5*  --  7.9*  MG  --   --  2.0  PHOS  --   --  1.7*   Liver Function Tests: Recent Labs  Lab 09/23/22 2231  AST 63*  ALT 26  ALKPHOS 54  BILITOT 0.9  PROT 7.2  ALBUMIN 3.0*   No results for input(s): "LIPASE", "AMYLASE" in the last 168 hours. No results for input(s): "AMMONIA" in the last 168 hours. CBC: Recent Labs  Lab 09/23/22 2231 09/24/22 0349 09/25/22 0215  WBC  15.9* 13.6* 9.9  NEUTROABS 12.7* 11.1* 7.0  HGB 12.7* 9.9* 9.9*  HCT 38.0* 29.7* 29.8*  MCV 79.2* 79.4* 79.5*  PLT 288 209 251   Cardiac Enzymes: No results for input(s): "CKTOTAL", "CKMB", "CKMBINDEX", "TROPONINI" in the last 168 hours. BNP: Invalid input(s): "POCBNP" CBG: No results for input(s): "GLUCAP" in the last 168 hours. D-Dimer No results for input(s): "DDIMER" in the last 72 hours. Hgb A1c No results for  input(s): "HGBA1C" in the last 72 hours. Lipid Profile No results for input(s): "CHOL", "HDL", "LDLCALC", "TRIG", "CHOLHDL", "LDLDIRECT" in the last 72 hours. Thyroid function studies No results for input(s): "TSH", "T4TOTAL", "T3FREE", "THYROIDAB" in the last 72 hours.  Invalid input(s): "FREET3" Anemia work up No results for input(s): "VITAMINB12", "FOLATE", "FERRITIN", "TIBC", "IRON", "RETICCTPCT" in the last 72 hours. Urinalysis    Component Value Date/Time   COLORURINE AMBER (A) 09/24/2022 0023   APPEARANCEUR CLOUDY (A) 09/24/2022 0023   LABSPEC 1.025 09/24/2022 0023   PHURINE 5.0 09/24/2022 0023   GLUCOSEU NEGATIVE 09/24/2022 0023   HGBUR LARGE (A) 09/24/2022 0023   BILIRUBINUR NEGATIVE 09/24/2022 0023   KETONESUR NEGATIVE 09/24/2022 0023   PROTEINUR >=300 (A) 09/24/2022 0023   UROBILINOGEN 0.2 11/01/2012 1429   NITRITE NEGATIVE 09/24/2022 0023   LEUKOCYTESUR NEGATIVE 09/24/2022 0023   Sepsis Labs Recent Labs  Lab 09/23/22 2231 09/24/22 0349 09/25/22 0215  WBC 15.9* 13.6* 9.9   Microbiology Recent Results (from the past 240 hour(s))  Blood Culture (routine x 2)     Status: Abnormal   Collection Time: 09/23/22 11:30 PM   Specimen: BLOOD  Result Value Ref Range Status   Specimen Description BLOOD RIGHT ANTECUBITAL  Final   Special Requests   Final    BOTTLES DRAWN AEROBIC AND ANAEROBIC Blood Culture adequate volume   Culture  Setup Time   Final    GRAM POSITIVE COCCI IN CHAINS ANAEROBIC BOTTLE ONLY CRITICAL VALUE NOTED.  VALUE IS CONSISTENT WITH PREVIOUSLY REPORTED AND CALLED VALUE.    Culture (A)  Final    STREPTOCOCCUS PNEUMONIAE SUSCEPTIBILITIES PERFORMED ON PREVIOUS CULTURE WITHIN THE LAST 5 DAYS. Performed at [facility], [place] [address], [place], [place] [address]    Report Status 09/26/2022 FINAL  Final  Blood Culture (routine x 2)     Status: Abnormal   Collection Time: 09/23/22 11:35 PM   Specimen: BLOOD  Result Value Ref Range Status    Specimen Description BLOOD SITE NOT SPECIFIED  Final   Special Requests   Final    BOTTLES DRAWN AEROBIC AND ANAEROBIC Blood Culture adequate volume   Culture  Setup Time   Final    GRAM POSITIVE COCCI IN CHAINS IN BOTH AEROBIC AND ANAEROBIC BOTTLES CRITICAL RESULT CALLED TO, READ BACK BY AND VERIFIED WITH: PHARMD LISA [place] ON 09/24/22 @ 1612 BY DRT     Culture (A)  Final    STREPTOCOCCUS PNEUMONIAE STAPHYLOCOCCUS HOMINIS THE SIGNIFICANCE OF ISOLATING THIS ORGANISM FROM A SINGLE SET OF BLOOD CULTURES WHEN MULTIPLE SETS ARE DRAWN IS UNCERTAIN. PLEASE NOTIFY THE MICROBIOLOGY DEPARTMENT WITHIN ONE WEEK IF SPECIATION AND SENSITIVITIES ARE REQUIRED. Performed at [facility], [place] [address], [place], [place] [address]    Report Status 09/26/2022 FINAL  Final   Organism ID, Bacteria STREPTOCOCCUS PNEUMONIAE  Final      Susceptibility   Streptococcus pneumoniae - MIC*    ERYTHROMYCIN >=8 RESISTANT Resistant     LEVOFLOXACIN 0.5 SENSITIVE Sensitive     VANCOMYCIN 0.5 SENSITIVE  Sensitive     PENICILLIN (meningitis) <=0.06 SENSITIVE Sensitive     PENO - penicillin <=0.06      PENICILLIN (non-meningitis) <=0.06 SENSITIVE Sensitive     PENICILLIN (oral) <=0.06 SENSITIVE Sensitive     CEFTRIAXONE (non-meningitis) <=0.12 SENSITIVE Sensitive     CEFTRIAXONE (meningitis) <=0.12 SENSITIVE Sensitive     * STREPTOCOCCUS PNEUMONIAE  Blood Culture ID Panel (Reflexed)     Status: Abnormal   Collection Time: 09/23/22 11:35 PM  Result Value Ref Range Status   Enterococcus faecalis NOT DETECTED NOT DETECTED Final   Enterococcus Faecium NOT DETECTED NOT DETECTED Final   Listeria monocytogenes NOT DETECTED NOT DETECTED Final   Staphylococcus species NOT DETECTED NOT DETECTED Final   Staphylococcus aureus (BCID) NOT DETECTED NOT DETECTED Final   Staphylococcus epidermidis NOT DETECTED NOT DETECTED Final   Staphylococcus lugdunensis NOT DETECTED NOT DETECTED Final   Streptococcus species DETECTED (A)  NOT DETECTED Final    Comment: CRITICAL RESULT CALLED TO, READ BACK BY AND VERIFIED WITH: PHARMD LISA CURRAN ON 09/24/22 @ 1612 BY DRT     Streptococcus agalactiae NOT DETECTED NOT DETECTED Final   Streptococcus pneumoniae DETECTED (A) NOT DETECTED Final    Comment: CRITICAL RESULT CALLED TO, READ BACK BY AND VERIFIED WITH: PHARMD LISA CURRAN ON 09/24/22 @ 1612 BY DRT     Streptococcus pyogenes NOT DETECTED NOT DETECTED Final   A.calcoaceticus-baumannii NOT DETECTED NOT DETECTED Final   Bacteroides fragilis NOT DETECTED NOT DETECTED Final   Enterobacterales NOT DETECTED NOT DETECTED Final   Enterobacter cloacae complex NOT DETECTED NOT DETECTED Final   Escherichia coli NOT DETECTED NOT DETECTED Final   Klebsiella aerogenes NOT DETECTED NOT DETECTED Final   Klebsiella oxytoca NOT DETECTED NOT DETECTED Final   Klebsiella pneumoniae NOT DETECTED NOT DETECTED Final   Proteus species NOT DETECTED NOT DETECTED Final   Salmonella species NOT DETECTED NOT DETECTED Final   Serratia marcescens NOT DETECTED NOT DETECTED Final   Haemophilus influenzae NOT DETECTED NOT DETECTED Final   Neisseria meningitidis NOT DETECTED NOT DETECTED Final   Pseudomonas aeruginosa NOT DETECTED NOT DETECTED Final   Stenotrophomonas maltophilia NOT DETECTED NOT DETECTED Final   Candida albicans NOT DETECTED NOT DETECTED Final   Candida auris NOT DETECTED NOT DETECTED Final   Candida glabrata NOT DETECTED NOT DETECTED Final   Candida krusei NOT DETECTED NOT DETECTED Final   Candida parapsilosis NOT DETECTED NOT DETECTED Final   Candida tropicalis NOT DETECTED NOT DETECTED Final   Cryptococcus neoformans/gattii NOT DETECTED NOT DETECTED Final    Comment: Performed at [facility], [address] [place], [place] [address]  Resp panel by RT-PCR (RSV, Flu A&B, Covid) Anterior Nasal Swab     Status: None   Collection Time: 09/23/22 11:47 PM   Specimen: Anterior Nasal Swab  Result Value Ref Range Status   SARS  Coronavirus 2 by RT PCR NEGATIVE NEGATIVE Final   Influenza A by PCR NEGATIVE NEGATIVE Final   Influenza B by PCR NEGATIVE NEGATIVE Final    Comment: (NOTE) The Xpert Xpress SARS-CoV-2/FLU/RSV plus assay is intended as an aid in the diagnosis of influenza from Nasopharyngeal swab specimens and should not be used as a sole basis for treatment. Nasal washings and aspirates are unacceptable for Xpert Xpress SARS-CoV-2/FLU/RSV testing.  Fact Sheet for Patients: EntrepreneurPulse.com.au  Fact Sheet for Healthcare Providers: IncredibleEmployment.be  This test is not yet approved or cleared by the [place] FDA and has been authorized for detection and/or  diagnosis of SARS-CoV-2 by FDA under an Emergency Use Authorization (EUA). This EUA will remain in effect (meaning this test can be used) for the duration of the COVID-19 declaration under Section 564(b)(1) of the Act, 21 U.S.C. section 360bbb-3(b)(1), unless the authorization is terminated or revoked.     Resp Syncytial Virus by PCR NEGATIVE NEGATIVE Final    Comment: (NOTE) Fact Sheet for Patients: EntrepreneurPulse.com.au  Fact Sheet for Healthcare Providers: IncredibleEmployment.be  This test is not yet approved or cleared by the [place] FDA and has been authorized for detection and/or diagnosis of SARS-CoV-2 by FDA under an Emergency Use Authorization (EUA). This EUA will remain in effect (meaning this test can be used) for the duration of the COVID-19 declaration under Section 564(b)(1) of the Act, 21 U.S.C. section 360bbb-3(b)(1), unless the authorization is terminated or revoked.  Performed at [facility], [place] [address], [place], [place] [address]   Culture, blood (Routine X 2) w Reflex to ID Panel     Status: None (Preliminary result)   Collection Time: 09/24/22  5:42 PM   Specimen: BLOOD RIGHT HAND  Result Value Ref Range Status    Specimen Description BLOOD RIGHT HAND  Final   Special Requests   Final    BOTTLES DRAWN AEROBIC AND ANAEROBIC Blood Culture adequate volume   Culture   Final    NO GROWTH 2 DAYS Performed at [facility], [place] [address] [place], [place] [address]    Report Status PENDING  Incomplete  Culture, blood (Routine X 2) w Reflex to ID Panel     Status: None (Preliminary result)   Collection Time: 09/24/22  5:52 PM   Specimen: BLOOD RIGHT ARM  Result Value Ref Range Status   Specimen Description BLOOD RIGHT ARM  Final   Special Requests   Final    BOTTLES DRAWN AEROBIC AND ANAEROBIC Blood Culture results may not be optimal due to an inadequate volume of blood received in culture bottles   Culture   Final    NO GROWTH 2 DAYS Performed at [facility], [place] [address], [place], [place] [address]    Report Status PENDING  Incomplete     Time coordinating discharge: Over 30 minutes  SIGNED:   Duard Brady, MD  [facility] 09/26/2022, 4:18 PM Pager   If 7PM-7AM, please contact night-coverage

## 2022-09-26 NOTE — Progress Notes (Signed)
Mobility Specialist Progress Note:   09/26/22 1025  Mobility  Activity Ambulated independently in hallway  Level of Assistance Independent  Assistive Device None  Distance Ambulated (ft) 400 ft  Activity Response Tolerated well  Mobility Referral Yes  $Mobility charge 1 Mobility   Pt agreeable to mobility session. Required no physical assistance throughout. SpO2 93% on RA with exertion. Pt back sitting EOB with all needs met, MD in room.  Nelta Numbers Mobility Specialist Please contact via SecureChat or  Rehab office at [phone]

## 2022-09-26 NOTE — Progress Notes (Signed)
Patient left AMA.  He removed his IV and stated that his ride was waiting for him.  Regardless off all instructions provided re: mediations he stated he had to leave.  Case Manager and MD informed.

## 2022-09-29 LAB — CULTURE, BLOOD (ROUTINE X 2)
Culture: NO GROWTH
Culture: NO GROWTH
Special Requests: ADEQUATE

## 2022-10-06 ENCOUNTER — Ambulatory Visit (HOSPITAL_COMMUNITY)
Admission: EM | Admit: 2022-10-06 | Discharge: 2022-10-06 | Disposition: A | Discharge status: Home / Self Care | Payer: 59

## 2022-10-06 DIAGNOSIS — Z638 Other specified problems related to primary support group: Secondary | ICD-10-CM | POA: Diagnosis not present

## 2022-10-06 NOTE — ED Notes (Signed)
Discharge instructions provided and Pt stated understanding. Pt alert, orient and ambulatory prior to d/c from facility. Personal belongings returned. Safety maintained.  

## 2022-10-06 NOTE — Discharge Instructions (Signed)
The suicide prevention education provided includes the following: Suicide risk factors Suicide prevention and interventions National Suicide Hotline telephone number [facility] assessment telephone number  City Emergency Assistance 911 County and/or Residential Mobile Crisis Unit telephone number   Request made of family/significant other to: Remove weapons (e.g., guns, rifles, knives), all items previously/currently identified as safety concern.   Remove drugs/medications (over the counter, prescriptions, illicit drugs), all items previously/currently identified as a safety concern.  

## 2022-10-06 NOTE — ED Provider Notes (Signed)
Behavioral Health Urgent Care Medical Screening Exam  Patient Name: Anthony Wiggins MRN: [id number] Date of Evaluation: 10/07/22 Chief Complaint:  Mother complaints that patient has been more agitated lately.  Patient does not believe he needs to be here and is requesting leave  Diagnosis:  Final diagnoses:  Family discord    History of Present illness: Anthony Wiggins is a 37 y.o. male patient presented to [facility] as a walk in  accompanied by his mother.  Mother complaints that patient has been more agitated lately.  Patient does not believe he needs to be here and is requesting leave.  Of note mother Darryl Wiggins [phone] presents with paperwork that states she is now patient's legal guardian  Anthony Wiggins, 37 y.o., male patient seen face to face by this provider and chart reviewed on 10/06/22. Per chart review male with significant history of polysubstance abuse (methamphetamine, cocaine, THC).  Patient reports he is prescribed Zyprexa 20 mg nightly and hydroxyzine 25 mg twice daily by Dr. Gretchen Short.  He reports compliance with medications.  Patient's mother states that patient has been more agitated lately.  He has been leaving home she does not know where he is going.  States the patient's girlfriend has contacted her due to patient screaming around the children.  She believes that patient should be admitted to the facility for several days to be tested for drugs and to be stabilized.  She admits that they have called the police out multiple times but nothing is ever done.  She states patient has not been physical that any altercation is only verbal.  She believes patient may be using substances.   Patient denies all accusations.  States yes he has gotten in arguments with his mother and his girlfriend but states an argument is not enough reason to bring somebody to the psych ward for assessment.  He adamantly denies any substance use.  He however states, "I will take a drug test here if that  is what it takes for my mom to get out of my back".  Patient was provided a urine cup and was escorted to the bathroom.  However when tech went to test urine it was cold and diluted on the results would not read.  When discussing results with patient he gave a grin.  He then proceeds to state that he got urine on the outside of the cup and as he was rinsing the cup off, he may have got some water got into the sample.  He refused to have another drug screen completed.  He requested to leave.  During evaluation Anthony Wiggins is observed sitting in the assessment room with his mother in no acute distress.  He is alert, oriented x 4, calm, cooperative and attentive.  He is pleasant throughout the interview.  He he reports he is mood is "good" and has a euthymic affect.  He has normal speech, and behavior.  Objectively there is no evidence of psychosis/mania or delusional thinking.  Patient is able to converse coherently, goal directed thoughts, no distractibility, or pre-occupation. Patient remained calm answered question appropriately.    At this time there is No evidence of imminent danger to self or others at this time. Patient does not meet criteria for psychiatric admission or IVC. Supportive therapy provided about ongoing stressors. Discussed crisis plan, callling [phone]/988 or going to Emergency Dep     Flowsheet Row ED from 10/06/2022 in [facility] ED to Hosp-Admission (Discharged)  from 09/23/2022 in [facility] 3E HF PCU ED from 07/01/2022 in [facility]  C-SSRS RISK CATEGORY No Risk No Risk No Risk       Psychiatric Specialty Exam  Presentation  General Appearance:Appropriate for Environment  Eye Contact:Good  Speech:Clear and Coherent; Normal Rate  Speech Volume:Normal  Handedness:Right   Mood and Affect  Mood: Euthymic  Affect: Congruent   Thought Process  Thought Processes: Coherent  Descriptions of  Associations:Intact  Orientation:Full (Time, Place and Person)  Thought Content:Logical  Diagnosis of Schizophrenia or Schizoaffective disorder in past: Yes  Duration of Psychotic Symptoms: Greater than six months  Hallucinations:None  Ideas of Reference:None  Suicidal Thoughts:No  Homicidal Thoughts:No   Sensorium  Memory: Immediate Good; Recent Good; Remote Good  Judgment: Good  Insight: Good   Executive Functions  Concentration: Good  Attention Span: Good  Recall: Good  Fund of Knowledge: Good  Language: Good   Psychomotor Activity  Psychomotor Activity: Normal   Assets  Assets: Communication Skills; Financial Resources/Insurance; Desire for Improvement; Resilience; Physical Health; Social Support   Sleep  Sleep: Good  Number of hours:  8   Physical Exam: Physical Exam Vitals and nursing note reviewed.  Constitutional:      Appearance: He is well-developed.  HENT:     Head: Normocephalic.  Eyes:     Conjunctiva/sclera: Conjunctivae normal.  Cardiovascular:     Rate and Rhythm: Normal rate.  Pulmonary:     Effort: Pulmonary effort is normal. No respiratory distress.  Musculoskeletal:        General: Normal range of motion.     Cervical back: Normal range of motion.  Skin:    Coloration: Skin is not jaundiced or pale.  Neurological:     Mental Status: He is alert and oriented to person, place, and time.  Psychiatric:        Attention and Perception: Attention and perception normal.        Mood and Affect: Mood is not anxious or depressed.        Speech: Speech normal.        Behavior: Behavior is not agitated. Behavior is cooperative.        Thought Content: Thought content normal. Thought content is not paranoid or delusional.        Cognition and Memory: Cognition normal.        Judgment: Judgment normal.    Review of Systems  Constitutional: Negative.   HENT: Negative.    Eyes: Negative.   Respiratory: Negative.     Cardiovascular: Negative.   Genitourinary: Negative.   Musculoskeletal: Negative.   Skin: Negative.   Neurological: Negative.   Psychiatric/Behavioral: Negative.     Blood pressure (!) 146/102, pulse 86, resp. rate 18, SpO2 98 %. There is no height or weight on file to calculate BMI.  Musculoskeletal: Strength & Muscle Tone: within normal limits Gait & Station: normal Patient leans: N/A   [facility] MSE Discharge Disposition for Follow up and Recommendations: Based on my evaluation the patient does not appear to have an emergency medical condition and can be discharged with resources and follow up care in outpatient services for Medication Management and Individual Therapy  Follow up with Dr. Gretchen Short- current medication management provider   Ardis Hughs, NP 10/07/2022, 8:23 AM

## 2022-10-06 NOTE — Progress Notes (Signed)
   10/06/22 1637  [facility] Triage Screening (Walk-ins at [facility] only)  How Did You Hear About [place]? Family/Friend  What Is the Reason for Your Visit/Call Today? Pt presents to [facility] voluntarily, accompanied by his mother due to behavior concerns. Pts mother reports increased agitation this week. Pt denies paranoia, drug or alcohol use, SI/HI and AVH.  How Long Has This Been Causing You Problems? <Week  Have You Recently Had Any Thoughts About Hurting Yourself? No  Are You Planning to Commit Suicide/Harm Yourself At This time? No  Have you Recently Had Thoughts About Hurting Someone Karolee Ohs? No  Are You Planning To Harm Someone At This Time? No  Are you currently experiencing any auditory, visual or other hallucinations? No  Have You Used Any Alcohol or Drugs in the Past 24 Hours? No  Do you have any current medical co-morbidities that require immediate attention? No  Clinician description of patient physical appearance/behavior: calm ,cooperative, unkempt  What Do You Feel Would Help You the Most Today? Stress Management;Social Support  If access to [facility] was not available, would you have sought care in the Emergency Department? No  Determination of Need Routine (7 days)  Options For Referral Outpatient Therapy;Medication Management

## 2022-10-09 ENCOUNTER — Emergency Department (HOSPITAL_COMMUNITY)
Admission: EM | Admit: 2022-10-09 | Discharge: 2022-10-10 | Disposition: A | Discharge status: Home / Self Care | Payer: 59 | Attending: Emergency Medicine | Admitting: Emergency Medicine

## 2022-10-09 ENCOUNTER — Emergency Department (HOSPITAL_COMMUNITY): Payer: 59

## 2022-10-09 ENCOUNTER — Other Ambulatory Visit: Payer: Self-pay

## 2022-10-09 ENCOUNTER — Encounter (HOSPITAL_COMMUNITY): Payer: Self-pay

## 2022-10-09 DIAGNOSIS — F1910 Other psychoactive substance abuse, uncomplicated: Secondary | ICD-10-CM | POA: Diagnosis present

## 2022-10-09 LAB — CBC
HCT: 37.7 % — ABNORMAL LOW (ref 39.0–52.0)
Hemoglobin: 12 g/dL — ABNORMAL LOW (ref 13.0–17.0)
MCH: 25.9 pg — ABNORMAL LOW (ref 26.0–34.0)
MCHC: 31.8 g/dL (ref 30.0–36.0)
MCV: 81.4 fL (ref 80.0–100.0)
Platelets: 539 10*3/uL — ABNORMAL HIGH (ref 150–400)
RBC: 4.63 MIL/uL (ref 4.22–5.81)
RDW: 14.5 % (ref 11.5–15.5)
WBC: 7.4 10*3/uL (ref 4.0–10.5)
nRBC: 0 % (ref 0.0–0.2)

## 2022-10-09 LAB — COMPREHENSIVE METABOLIC PANEL
ALT: 21 U/L (ref 0–44)
AST: 24 U/L (ref 15–41)
Albumin: 3.9 g/dL (ref 3.5–5.0)
Alkaline Phosphatase: 67 U/L (ref 38–126)
Anion gap: 10 (ref 5–15)
BUN: 32 mg/dL — ABNORMAL HIGH (ref 6–20)
CO2: 22 mmol/L (ref 22–32)
Calcium: 8.8 mg/dL — ABNORMAL LOW (ref 8.9–10.3)
Chloride: 103 mmol/L (ref 98–111)
Creatinine, Ser: 1.69 mg/dL — ABNORMAL HIGH (ref 0.61–1.24)
GFR, Estimated: 53 mL/min — ABNORMAL LOW (ref >60)
Glucose, Bld: 108 mg/dL — ABNORMAL HIGH (ref 70–99)
Potassium: 4.1 mmol/L (ref 3.5–5.1)
Sodium: 135 mmol/L (ref 135–145)
Total Bilirubin: 0.5 mg/dL (ref 0.3–1.2)
Total Protein: 8.5 g/dL — ABNORMAL HIGH (ref 6.5–8.1)

## 2022-10-09 NOTE — ED Provider Notes (Signed)
[place] EMERGENCY DEPARTMENT AT [facility] Provider Note   CSN: [id number] Arrival date & time: 10/09/22  2045     History  Chief Complaint  Patient presents with   Psychiatric Evaluation    Anthony Wiggins is a 37 y.o. male with history of schizophrenia.  Patient presents to ED for evaluation.  Per EMS report, the patient arrives from home.  The patient's mother called out saying he was having a "psych episode" and there were numerous 911 calls from the house today for laying in the road. The patient has a history of meth induced psychosis.  On my examination, the patient alert and oriented x 4.  The patient denies SI, HI, AVH.  Patient endorses using 10 mg of oxycodone earlier today.  Patient denies any other drug use.  Patient goes on to state that he is tired because he has stayed up all night however denies methamphetamine use.  Patient states he is having pain in his left lower flank secondary to his "pneumonia".  Patient reports recent admission for this.  Patient denies fevers, nausea or vomiting.  Denies shortness of breath or chest pain.  HPI     Home Medications Prior to Admission medications   Medication Sig Start Date End Date Taking? Authorizing Provider  amoxicillin (AMOXIL) 875 MG tablet Take 1 tablet (875 mg total) by mouth 2 (two) times daily for 14 days. 09/26/22 10/10/22  Willeen Niece, MD  hydrOXYzine (ATARAX) 25 MG tablet Take 1 tablet (25 mg total) by mouth every 6 (six) hours. Patient taking differently: Take 25 mg by mouth every 4 (four) hours as needed for anxiety. 06/20/22   Rankin, Shuvon B, NP  OLANZapine (ZYPREXA) 15 MG tablet Take 1 tablet (15 mg total) by mouth daily with supper. Patient taking differently: Take 15 mg by mouth at bedtime. 06/20/22   Rankin, Shuvon B, NP  Pseudoeph-CPM-DM-APAP (TYLENOL COLD & FLU DAY/NIGHT PO) Take 1 tablet by mouth daily as needed (For cold symptoms).    [provider]      Allergies    Fish  allergy and Naproxen    Review of Systems   Review of Systems  Psychiatric/Behavioral:  Positive for behavioral problems.   All other systems reviewed and are negative.   Physical Exam Updated Vital Signs BP 112/78   Pulse 72   Temp 98.2 F (36.8 C)   Resp 16   Ht 5\' 10"  (1.778 m)   Wt 68 kg   SpO2 98%   BMI 21.52 kg/m  Physical Exam Vitals and nursing note reviewed.  Constitutional:      General: He is not in acute distress.    Appearance: He is well-developed.  HENT:     Head: Normocephalic and atraumatic.  Eyes:     Conjunctiva/sclera: Conjunctivae normal.  Cardiovascular:     Rate and Rhythm: Normal rate and regular rhythm.     Heart sounds: No murmur heard. Pulmonary:     Effort: Pulmonary effort is normal. No respiratory distress.     Breath sounds: Normal breath sounds.  Abdominal:     Palpations: Abdomen is soft.     Tenderness: There is no abdominal tenderness.  Musculoskeletal:        General: No swelling.     Cervical back: Neck supple.  Skin:    General: Skin is warm and dry.     Capillary Refill: Capillary refill takes less than 2 seconds.  Neurological:     Mental Status:  He is alert and oriented to person, place, and time.  Psychiatric:        Mood and Affect: Mood normal.        Thought Content: Thought content does not include homicidal or suicidal ideation. Thought content does not include homicidal or suicidal plan.     Comments: Patient alert and oriented x 3.  Denies SI, HI, AVH.     ED Results / Procedures / Treatments   Labs (all labs ordered are listed, but only abnormal results are displayed) Labs Reviewed  CBC - Abnormal; Notable for the following components:      Result Value   Hemoglobin 12.0 (*)    HCT 37.7 (*)    MCH 25.9 (*)    Platelets 539 (*)    All other components within normal limits  COMPREHENSIVE METABOLIC PANEL - Abnormal; Notable for the following components:   Glucose, Bld 108 (*)    BUN 32 (*)    Creatinine,  Ser 1.69 (*)    Calcium 8.8 (*)    Total Protein 8.5 (*)    GFR, Estimated 53 (*)    All other components within normal limits  RAPID URINE DRUG SCREEN, HOSP PERFORMED    EKG None  Radiology DG Chest 2 View  Result Date: 10/09/2022 CLINICAL DATA:  Shortness of breath EXAM: CHEST - 2 VIEW COMPARISON:  Radiographs 09/23/2022 FINDINGS: The previous right medial lung base consolidation has nearly resolved. No new consolidation. No pleural effusion or pneumothorax. Normal cardiomediastinal silhouette. No displaced rib fractures. IMPRESSION: Improving right lower lobe pneumonia. Electronically Signed   By: Minerva Fester M.D.   On: 10/09/2022 22:03    Procedures Procedures   Medications Ordered in ED Medications  sodium chloride 0.9 % bolus 1,000 mL (1,000 mLs Intravenous New Bag/Given 10/10/22 0029)    ED Course/ Medical Decision Making/ A&P  Medical Decision Making Amount and/or Complexity of Data Reviewed Labs: ordered. Radiology: ordered.   37 year old male presents to ED for evaluation.  Please see HPI for further details.  On my examination the patient alert and oriented x 3.  Patient denies SI, HI.  Patient reports oxycodone use today however denies any other drug use.  Patient denies chest pain, shortness of breath.  Patient does complain of pneumonia that he was recently discharged in the hospital for.  Will collect lab work to include CBC, BMP, chest x-ray, rapid urine drug screen.  CBC unremarkable with no leukocytosis or anemia.  CMP shows elevated creatinine of 1.69.  Patient discharged 2 weeks ago with creatinine 1.1.  This is concerning for AKI.  Patient BUN also elevated to 32.  Will provide patient 1 L fluid. Discussed with attending Dr. Eudelia Bunch. Patient has had no nausea or vomiting here so he will be able to continue hydrating himself as an outpatient.  Patient mother at bedside.  Patient mother concerned for patient presentation earlier today.  The patient has  acted appropriate here in the department.  Patient states that he was acting that way due to lack of sleep, taking oxycodone.  Patient will be discharged with substance use resources, outpatient resources for shelter.  Patient advised to return to the ED with any new or worsening signs or symptoms.  Patient had all of his questions answered his satisfaction.  Patient stable for discharge.   Final Clinical Impression(s) / ED Diagnoses Final diagnoses:  Polysubstance abuse    Rx / DC Orders ED Discharge Orders     None  Al Decant, PA-C 10/10/22 0034    Elayne Snare K, DO 10/10/22 1455

## 2022-10-09 NOTE — ED Provider Notes (Incomplete)
[place] EMERGENCY DEPARTMENT AT [facility] Provider Note   CSN: [id number] Arrival date & time: 10/09/22  2045     History  Chief Complaint  Patient presents with  . Psychiatric Evaluation    Anthony Wiggins is a 37 y.o. male with history of schizophrenia.  Patient presents to ED for evaluation.  Per EMS report, the patient arrives from home.  The patient's mother called out saying he was having a "psych episode" and there were numerous 911 calls from the house today for laying in the road.  The mom was requesting transfer for evaluation of the patient.  The patient has a history of meth induced psychosis.  On my examination, the patient alert and oriented x 4.  The patient denies SI, HI, AVH.  Patient seemingly nodding off during examination, endorses using 10 mg or oxycodone earlier today.  Patient denies any other drug use.  Patient is on to state that he has tired because he has stayed up all night however denies methamphetamine use.  Patient states he is having pain in his left lower flank secondary to his "pneumonia".  Patient reports recent admission for this.  Patient denies fevers, nausea or vomiting.  Denies shortness of breath or chest pain.  HPI     Home Medications Prior to Admission medications   Medication Sig Start Date End Date Taking? Authorizing Provider  amoxicillin (AMOXIL) 875 MG tablet Take 1 tablet (875 mg total) by mouth 2 (two) times daily for 14 days. 09/26/22 10/10/22  Willeen Niece, MD  hydrOXYzine (ATARAX) 25 MG tablet Take 1 tablet (25 mg total) by mouth every 6 (six) hours. Patient taking differently: Take 25 mg by mouth every 4 (four) hours as needed for anxiety. 06/20/22   Rankin, Shuvon B, NP  OLANZapine (ZYPREXA) 15 MG tablet Take 1 tablet (15 mg total) by mouth daily with supper. Patient taking differently: Take 15 mg by mouth at bedtime. 06/20/22   Rankin, Shuvon B, NP  Pseudoeph-CPM-DM-APAP (TYLENOL COLD & FLU DAY/NIGHT PO) Take 1 tablet  by mouth daily as needed (For cold symptoms).    [provider]      Allergies    Fish allergy and Naproxen    Review of Systems   Review of Systems  Physical Exam Updated Vital Signs BP 97/69   Pulse 93   Temp 98.2 F (36.8 C)   Resp 16   Ht  (1.778 m)   Wt 68 kg   SpO2 100%   BMI 21.52 kg/m  Physical Exam  ED Results / Procedures / Treatments   Labs (all labs ordered are listed, but only abnormal results are displayed) Labs Reviewed  CBC  RAPID URINE DRUG SCREEN, HOSP PERFORMED  COMPREHENSIVE METABOLIC PANEL    EKG None  Radiology No results found.  Procedures Procedures  {Document cardiac monitor, telemetry assessment procedure when appropriate:1}  Medications Ordered in ED Medications - No data to display  ED Course/ Medical Decision Making/ A&P   {   Click here for ABCD2, HEART and other calculatorsREFRESH Note before signing :1}                          Medical Decision Making Amount and/or Complexity of Data Reviewed Labs: ordered. Radiology: ordered.   ***  {Document critical care time when appropriate:1} {Document review of labs and clinical decision tools ie heart score, Chads2Vasc2 etc:1}  {Document your independent review of radiology images,  and any outside records:1} {Document your discussion with family members, caretakers, and with consultants:1} {Document social determinants of health affecting pt's care:1} {Document your decision making why or why not admission, treatments were needed:1} Final Clinical Impression(s) / ED Diagnoses Final diagnoses:  None    Rx / DC Orders ED Discharge Orders     None

## 2022-10-09 NOTE — ED Triage Notes (Signed)
Arrives EMS from home. Mom called out saying he was having a "psych episode" numerous 911 calls to house today for laying in the road. Mom request transport for evaluation.   Hx of meth induced psychosis. Denies si/hi.

## 2022-10-10 DIAGNOSIS — F1910 Other psychoactive substance abuse, uncomplicated: Secondary | ICD-10-CM | POA: Diagnosis not present

## 2022-10-10 MED ORDER — SODIUM CHLORIDE 0.9 % IV BOLUS
1000.0000 mL | Freq: Once | INTRAVENOUS | Status: AC
  Administered 2022-10-10: 1000 mL via INTRAVENOUS

## 2022-10-10 NOTE — Discharge Instructions (Addendum)
Return to the ED with any new symptoms Please review attached guide concerning shelters, outpatient counseling and substance abuse counseling Please continue hydrating yourself as an outpatient

## 2022-11-15 ENCOUNTER — Ambulatory Visit (HOSPITAL_COMMUNITY)
Admission: EM | Admit: 2022-11-15 | Discharge: 2022-11-15 | Disposition: A | Discharge status: Home / Self Care | Payer: 59

## 2022-11-15 DIAGNOSIS — F1990 Other psychoactive substance use, unspecified, uncomplicated: Secondary | ICD-10-CM | POA: Diagnosis not present

## 2022-11-15 DIAGNOSIS — Z046 Encounter for general psychiatric examination, requested by authority: Secondary | ICD-10-CM

## 2022-11-15 DIAGNOSIS — Z7689 Persons encountering health services in other specified circumstances: Secondary | ICD-10-CM | POA: Diagnosis not present

## 2022-11-15 NOTE — Discharge Instructions (Addendum)
Discharge recommendations:   Medications: Patient is to take medications as prescribed. No medication changes were made during your visit today. The patient or patient's guardian is to contact a medical professional and/or outpatient provider to address any new side effects that develop. The patient or the patient's guardian should update outpatient providers of any new medications and/or medication changes.   Outpatient Follow up: [facility]/ Dr Regan Lemming for medication management and therapy.   Therapy: We recommend that patient participate in individual therapy to address mental health concerns.  Atypical antipsychotics: If you are prescribed an atypical antipsychotic, it is recommended that your height, weight, BMI, blood pressure, fasting lipid panel, and fasting blood sugar be monitored by your outpatient providers.  Safety:   The following safety precautions should be taken:   No sharp objects. This includes scissors, razors, scrapers, and putty knives.   Chemicals should be removed and locked up.   Medications should be removed and locked up.   Weapons should be removed and locked up. This includes firearms, knives and instruments that can be used to cause injury.   The patient should abstain from use of illicit substances/drugs and abuse of any medications.  If symptoms worsen or do not continue to improve or if the patient becomes actively suicidal or homicidal then it is recommended that the patient return to the closest hospital emergency department, the [facility], or call 911 for further evaluation and treatment. National Suicide Prevention Lifeline 1-800-SUICIDE or 252-526-2115.  About 988 988 offers 24/7 access to trained crisis counselors who can help people experiencing mental health-related distress. People can call or text 988 or chat 988lifeline.org for themselves or if they are worried about a loved one who may  need crisis support.
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
## 2022-11-15 NOTE — ED Notes (Signed)
Patient A&O x 4, ambulatory. Patient discharged in no acute distress. Patient denied SI/HI, A/VH upon discharge. Patient verbalized understanding of all discharge instructions explained by staff, to include follow up appointments, .  Pt belongings returned to patient from locker  intact. Patient escorted to lobby via staff for transport to destination. Safety maintained. Provider states that the patients ivc has been rescinded and that he is discharging home with his mom.
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
## 2023-05-15 ENCOUNTER — Ambulatory Visit (INDEPENDENT_AMBULATORY_CARE_PROVIDER_SITE_OTHER): Payer: 59 | Admitting: Primary Care

## 2023-05-15 ENCOUNTER — Telehealth (INDEPENDENT_AMBULATORY_CARE_PROVIDER_SITE_OTHER): Payer: Self-pay | Admitting: Primary Care

## 2023-05-15 NOTE — Telephone Encounter (Signed)
Called to confirm apt. Pt phone was unavailable.

## 2023-05-16 ENCOUNTER — Ambulatory Visit (INDEPENDENT_AMBULATORY_CARE_PROVIDER_SITE_OTHER): Payer: 59 | Admitting: Primary Care

## 2024-07-22 ENCOUNTER — Ambulatory Visit (HOSPITAL_COMMUNITY)
Admission: EM | Admit: 2024-07-22 | Discharge: 2024-07-22 | Disposition: A | Discharge status: Home / Self Care | Payer: MEDICAID | Source: Home / Self Care

## 2024-07-22 DIAGNOSIS — G4709 Other insomnia: Secondary | ICD-10-CM

## 2024-07-22 DIAGNOSIS — F17210 Nicotine dependence, cigarettes, uncomplicated: Secondary | ICD-10-CM | POA: Insufficient documentation

## 2024-07-22 NOTE — ED Provider Notes (Signed)
 Behavioral Health Urgent Care Medical Screening Exam  Patient Name: Anthony Wiggins MRN: [id number] Date of Evaluation: 07/22/24 Chief Complaint:  having difficulty sleeping for the pass 3 day  Diagnosis:  Final diagnoses:  Other insomnia    History of Present illness: Anthony Wiggins is a 39 y.o. male.  With a history of substance abuse, drug-induced insomnia, noncompliance with medication regiment.  Patient presented to [facility], voluntarily accompanied by his mother.  Per the patient for the last 3 days he has been having occultly sleeping.  According to him he would sleep but then he would not sleep for too long.  Patient stated that he lives with his girlfriend and his 2 kids.  According to patient he is currently not seeing a psychiatric provider or any counselor.  Patient also stated he is not currently not taking any prescribed medications.   Assessment Face-to-face assessment of patient, patient is alert and oriented x 4, speech is clear, maintain eye contact.  Patient does not appear to be in any immediate distress does not seem to be influenced by internal stimuli.  Patient denies SI, HI, AVH or paranoia.  Denies alcohol use reports he use cigarettes on a regular basis.  Patient denies access to guns denied wanting to hurt himself or others.  Writer discussed with patient options such as using over-the-counter medications such as melatonin.  Or other over-the-counter sleep aid.   Disposition Writer did offer patient a one-time dose of hydroxyzine  however patient stated he did not want any medicine he will just go to the pharmacy and get some melatonin.  At the time of this assessment, patient does not seem to be a risk to himself or others, patient is advised to call 911 or return to nearest ED should he experience suicidal thoughts homicidal ideation or hallucination.  Recommend discharge with patient to follow-up with outside provider.  Flowsheet Row ED from 07/22/2024 in [facility] ED from 11/15/2022 in [facility] ED from 10/09/2022 in [facility] Emergency Department at [facility]  C-SSRS RISK CATEGORY No Risk No Risk No Risk    Psychiatric Specialty Exam  Presentation  General Appearance:Casual  Eye Contact:Good  Speech:Clear and Coherent  Speech Volume:No data recorded Handedness:Right   Mood and Affect  Mood: Anxious  Affect: Congruent   Thought Process  Thought Processes: Coherent  Descriptions of Associations:Intact  Orientation:Full (Time, Place and Person)  Thought Content:WDL  Diagnosis of Schizophrenia or Schizoaffective disorder in past: No data recorded Duration of Psychotic Symptoms: No data recorded Hallucinations:None  Ideas of Reference:None  Suicidal Thoughts:No  Homicidal Thoughts:No   Sensorium  Memory: Immediate Fair  Judgment: Fair  Insight: Fair   Art Therapist  Concentration: Fair  Attention Span: Fair  Recall: [employer] of Knowledge: Fair  Language: Fair   Psychomotor Activity  Psychomotor Activity: Normal   Assets  Assets: Desire for Improvement; Vocational/Educational   Sleep  Sleep: Poor  Number of hours:  3   Physical Exam: Physical Exam HENT:     Head: Normocephalic.     Nose: Nose normal.  Eyes:     Pupils: Pupils are equal, round, and reactive to light.  Cardiovascular:     Rate and Rhythm: Normal rate.  Pulmonary:     Effort: Pulmonary effort is normal.  Musculoskeletal:        General: Normal range of motion.     Cervical back: Normal range of motion.  Neurological:  General: No focal deficit present.     Mental Status: He is alert.  Psychiatric:        Mood and Affect: Mood normal.    Review of Systems  Constitutional: Negative.   HENT: Negative.    Eyes: Negative.   Respiratory: Negative.    Cardiovascular: Negative.   Gastrointestinal: Negative.   Genitourinary: Negative.    Musculoskeletal: Negative.   Skin: Negative.   Neurological: Negative.   Psychiatric/Behavioral:  The patient has insomnia.    Blood pressure (!) 132/91, pulse 98, temperature 98.5 F (36.9 C), temperature source Oral, resp. rate 20, SpO2 99%. There is no height or weight on file to calculate BMI.  Musculoskeletal: Strength & Muscle Tone: within normal limits Gait & Station: normal Patient leans: N/A   [facility] MSE Discharge Disposition for Follow up and Recommendations: Based on my evaluation the patient does not appear to have an emergency medical condition and can be discharged with resources and follow up care in outpatient services for Medication Management   Gaither Pouch, NP 07/22/2024, 11:24 PM

## 2024-07-22 NOTE — Progress Notes (Signed)
" °   07/22/24 2219  [facility] Triage Screening (Walk-ins at [facility] only)  How Did You Hear About Us ? Family/Friend  What Is the Reason for Your Visit/Call Today? Pt presents to [facility] as a voluntary walk-in, accompanied by his mother with complaint of insomnia/lack of sleep. Pt reports that he has not slept well since Monday and his family is concerned about his well-being. Pt indicates that he may be experiencing insomnia due to cabin fever. Pt unable to identify immediate stressors related to insomnia.  Per chart review, pt has history of polysubstance use and psychosis. Pt denies being established with outpatient therapy or medication management at this time. Pt currently denies SI,HI,AVH and substance/alcohol use.  How Long Has This Been Causing You Problems? <Week  Have You Recently Had Any Thoughts About Hurting Yourself? No  Are You Planning to Commit Suicide/Harm Yourself At This time? No  Have you Recently Had Thoughts About Hurting Someone [name]? No  Are You Planning To Harm Someone At This Time? No  Physical Abuse Denies  Verbal Abuse Denies  Sexual Abuse Denies  Exploitation of patient/patient's resources Denies  Self-Neglect Denies  Are you currently experiencing any auditory, visual or other hallucinations? No  Have You Used Any Alcohol or Drugs in the Past 24 Hours? No  Do you have any current medical co-morbidities that require immediate attention? No  Clinician description of patient physical appearance/behavior: calm, cooperative,  What Do You Feel Would Help You the Most Today? Medication(s);Social Support  If access to [facility] Urgent Care was not available, would you have sought care in the Emergency Department? No  Determination of Need Routine (7 days)  Options For Referral Other: Comment;Medication Management;Outpatient Therapy    "

## 2024-07-22 NOTE — Discharge Instructions (Signed)
 Follow-up with outside provider or PCP

## 2024-07-23 ENCOUNTER — Other Ambulatory Visit: Payer: Self-pay

## 2024-07-23 ENCOUNTER — Encounter (HOSPITAL_COMMUNITY): Payer: Self-pay | Admitting: Emergency Medicine

## 2024-07-23 ENCOUNTER — Ambulatory Visit (HOSPITAL_COMMUNITY)
Admission: EM | Admit: 2024-07-23 | Discharge: 2024-07-24 | Disposition: A | Discharge status: Other Acute Inpatient Hospital | Payer: MEDICAID | Attending: Psychiatry | Admitting: Psychiatry

## 2024-07-23 ENCOUNTER — Emergency Department (HOSPITAL_COMMUNITY)
Admission: EM | Admit: 2024-07-23 | Discharge: 2024-07-23 | Disposition: A | Discharge status: Home / Self Care | Payer: MEDICAID | Source: Ambulatory Visit | Attending: Emergency Medicine | Admitting: Emergency Medicine

## 2024-07-23 DIAGNOSIS — F1994 Other psychoactive substance use, unspecified with psychoactive substance-induced mood disorder: Secondary | ICD-10-CM

## 2024-07-23 DIAGNOSIS — F19982 Other psychoactive substance use, unspecified with psychoactive substance-induced sleep disorder: Secondary | ICD-10-CM

## 2024-07-23 DIAGNOSIS — Z720 Tobacco use: Secondary | ICD-10-CM | POA: Insufficient documentation

## 2024-07-23 DIAGNOSIS — R9431 Abnormal electrocardiogram [ECG] [EKG]: Secondary | ICD-10-CM

## 2024-07-23 DIAGNOSIS — F1520 Other stimulant dependence, uncomplicated: Secondary | ICD-10-CM | POA: Diagnosis not present

## 2024-07-23 DIAGNOSIS — F1910 Other psychoactive substance abuse, uncomplicated: Secondary | ICD-10-CM | POA: Insufficient documentation

## 2024-07-23 DIAGNOSIS — G4700 Insomnia, unspecified: Secondary | ICD-10-CM | POA: Insufficient documentation

## 2024-07-23 DIAGNOSIS — I444 Left anterior fascicular block: Secondary | ICD-10-CM

## 2024-07-23 LAB — COMPREHENSIVE METABOLIC PANEL WITH GFR
ALT: 17 U/L (ref 0–44)
AST: 26 U/L (ref 15–41)
Albumin: 5.3 g/dL — ABNORMAL HIGH (ref 3.5–5.0)
Alkaline Phosphatase: 85 U/L (ref 38–126)
Anion gap: 19 — ABNORMAL HIGH (ref 5–15)
BUN: 18 mg/dL (ref 6–20)
CO2: 25 mmol/L (ref 22–32)
Calcium: 10.2 mg/dL (ref 8.9–10.3)
Chloride: 96 mmol/L — ABNORMAL LOW (ref 98–111)
Creatinine, Ser: 1.36 mg/dL — ABNORMAL HIGH (ref 0.61–1.24)
GFR, Estimated: >60 mL/min
Glucose, Bld: 114 mg/dL — ABNORMAL HIGH (ref 70–99)
Potassium: 3.1 mmol/L — ABNORMAL LOW (ref 3.5–5.1)
Sodium: 139 mmol/L (ref 135–145)
Total Bilirubin: 0.7 mg/dL (ref 0.0–1.2)
Total Protein: 9.3 g/dL — ABNORMAL HIGH (ref 6.5–8.1)

## 2024-07-23 LAB — LIPID PANEL
Cholesterol: 262 mg/dL — ABNORMAL HIGH (ref 0–200)
HDL: 57 mg/dL
LDL Cholesterol: 185 mg/dL — ABNORMAL HIGH (ref 0–99)
Total CHOL/HDL Ratio: 4.6 ratio
Triglycerides: 99 mg/dL
VLDL: 20 mg/dL (ref 0–40)

## 2024-07-23 LAB — CBC WITH DIFFERENTIAL/PLATELET
Abs Immature Granulocytes: 0.05 10*3/uL (ref 0.00–0.07)
Basophils Absolute: 0.1 10*3/uL (ref 0.0–0.1)
Basophils Relative: 0 %
Eosinophils Absolute: 0.1 10*3/uL (ref 0.0–0.5)
Eosinophils Relative: 1 %
HCT: 44.9 % (ref 39.0–52.0)
Hemoglobin: 15 g/dL (ref 13.0–17.0)
Immature Granulocytes: 0 %
Lymphocytes Relative: 15 %
Lymphs Abs: 1.9 10*3/uL (ref 0.7–4.0)
MCH: 26.2 pg (ref 26.0–34.0)
MCHC: 33.4 g/dL (ref 30.0–36.0)
MCV: 78.4 fL — ABNORMAL LOW (ref 80.0–100.0)
Monocytes Absolute: 0.6 10*3/uL (ref 0.1–1.0)
Monocytes Relative: 5 %
Neutro Abs: 9.5 10*3/uL — ABNORMAL HIGH (ref 1.7–7.7)
Neutrophils Relative %: 79 %
Platelets: 424 10*3/uL — ABNORMAL HIGH (ref 150–400)
RBC: 5.73 MIL/uL (ref 4.22–5.81)
RDW: 13.3 % (ref 11.5–15.5)
WBC: 12.2 10*3/uL — ABNORMAL HIGH (ref 4.0–10.5)
nRBC: 0 % (ref 0.0–0.2)

## 2024-07-23 LAB — POCT URINE DRUG SCREEN - MANUAL ENTRY (I-SCREEN)
POC Amphetamine UR: NOT DETECTED
POC Buprenorphine (BUP): NOT DETECTED
POC Cocaine UR: NOT DETECTED
POC Marijuana UR: NOT DETECTED
POC Methadone UR: NOT DETECTED
POC Methamphetamine UR: NOT DETECTED
POC Morphine: NOT DETECTED
POC Oxazepam (BZO): NOT DETECTED
POC Oxycodone UR: NOT DETECTED
POC Secobarbital (BAR): NOT DETECTED

## 2024-07-23 LAB — SYPHILIS: RPR W/REFLEX TO RPR TITER AND TREPONEMAL ANTIBODIES, TRADITIONAL SCREENING AND DIAGNOSIS ALGORITHM
RPR Ser Ql: REACTIVE — AB
RPR Titer: 1:1 {titer}

## 2024-07-23 LAB — URINALYSIS, ROUTINE W REFLEX MICROSCOPIC
Bilirubin Urine: NEGATIVE
Glucose, UA: NEGATIVE mg/dL
Hgb urine dipstick: NEGATIVE
Ketones, ur: 20 mg/dL — AB
Leukocytes,Ua: NEGATIVE
Nitrite: NEGATIVE
Protein, ur: 100 mg/dL — AB
Specific Gravity, Urine: 1.033 — ABNORMAL HIGH (ref 1.005–1.030)
pH: 5 (ref 5.0–8.0)

## 2024-07-23 LAB — TSH: TSH: 0.851 u[IU]/mL (ref 0.350–4.500)

## 2024-07-23 LAB — HEMOGLOBIN A1C
Hgb A1c MFr Bld: 6.1 % — ABNORMAL HIGH (ref 4.8–5.6)
Mean Plasma Glucose: 128.37 mg/dL

## 2024-07-23 LAB — ETHANOL: Alcohol, Ethyl (B): <15 mg/dL

## 2024-07-23 MED ORDER — MAGNESIUM HYDROXIDE 400 MG/5ML PO SUSP: 30.0000 mL | Freq: Every day | ORAL | Status: DC | PRN

## 2024-07-23 MED ORDER — HYDROXYZINE HCL 25 MG PO TABS: 25.0000 mg | ORAL_TABLET | Freq: Three times a day (TID) | ORAL | Status: DC | PRN

## 2024-07-23 MED ORDER — HYDROXYZINE HCL 25 MG PO TABS: 25.0000 mg | ORAL_TABLET | Freq: Four times a day (QID) | ORAL | 0 refills | Status: DC

## 2024-07-23 MED ORDER — POTASSIUM CHLORIDE CRYS ER 20 MEQ PO TBCR
40.0000 meq | EXTENDED_RELEASE_TABLET | Freq: Once | ORAL | Status: AC
  Administered 2024-07-23: 40 meq via ORAL
  Filled 2024-07-23: qty 2

## 2024-07-23 MED ORDER — HALOPERIDOL 5 MG PO TABS: 5.0000 mg | ORAL_TABLET | Freq: Three times a day (TID) | ORAL | Status: DC | PRN

## 2024-07-23 MED ORDER — HALOPERIDOL LACTATE 5 MG/ML IJ SOLN
10.0000 mg | Freq: Three times a day (TID) | INTRAMUSCULAR | Status: DC | PRN
  Administered 2024-07-23: 10 mg via INTRAMUSCULAR
  Filled 2024-07-23: qty 2

## 2024-07-23 MED ORDER — VITAMIN C 500 MG PO TABS
1000.0000 mg | ORAL_TABLET | Freq: Two times a day (BID) | ORAL | Status: DC
  Administered 2024-07-23 (×2): 1000 mg via ORAL
  Filled 2024-07-23 (×2): qty 2

## 2024-07-23 MED ORDER — HALOPERIDOL LACTATE 5 MG/ML IJ SOLN: 5.0000 mg | Freq: Three times a day (TID) | INTRAMUSCULAR | Status: DC | PRN

## 2024-07-23 MED ORDER — LORAZEPAM 2 MG/ML IJ SOLN
2.0000 mg | Freq: Three times a day (TID) | INTRAMUSCULAR | Status: DC | PRN
  Administered 2024-07-23: 2 mg via INTRAMUSCULAR

## 2024-07-23 MED ORDER — ALUM & MAG HYDROXIDE-SIMETH 200-200-20 MG/5ML PO SUSP: 30.0000 mL | ORAL | Status: DC | PRN

## 2024-07-23 MED ORDER — ACETAMINOPHEN 325 MG PO TABS: 650.0000 mg | ORAL_TABLET | Freq: Four times a day (QID) | ORAL | Status: DC | PRN

## 2024-07-23 MED ORDER — VITAMIN C 500 MG PO TABS: 1000.0000 mg | ORAL_TABLET | Freq: Two times a day (BID) | ORAL | Status: DC

## 2024-07-23 MED ORDER — DIPHENHYDRAMINE HCL 50 MG PO CAPS: 50.0000 mg | ORAL_CAPSULE | Freq: Three times a day (TID) | ORAL | Status: DC | PRN

## 2024-07-23 MED ORDER — HYDROXYZINE HCL 25 MG PO TABS
25.0000 mg | ORAL_TABLET | Freq: Once | ORAL | Status: AC
  Administered 2024-07-23: 25 mg via ORAL
  Filled 2024-07-23: qty 1

## 2024-07-23 MED ORDER — OLANZAPINE 5 MG PO TABS
5.0000 mg | ORAL_TABLET | Freq: Once | ORAL | Status: AC
  Administered 2024-07-23: 5 mg via ORAL
  Filled 2024-07-23: qty 1

## 2024-07-23 MED ORDER — DIPHENHYDRAMINE HCL 50 MG/ML IJ SOLN
50.0000 mg | Freq: Three times a day (TID) | INTRAMUSCULAR | Status: DC | PRN
  Filled 2024-07-23: qty 1

## 2024-07-23 MED ORDER — OLANZAPINE 5 MG PO TABS
5.0000 mg | ORAL_TABLET | Freq: Every day | ORAL | Status: DC
  Administered 2024-07-23: 5 mg via ORAL
  Filled 2024-07-23: qty 1

## 2024-07-23 MED ORDER — LORAZEPAM 2 MG/ML IJ SOLN
2.0000 mg | Freq: Three times a day (TID) | INTRAMUSCULAR | Status: DC | PRN
  Filled 2024-07-23: qty 1

## 2024-07-23 MED ORDER — ONDANSETRON 4 MG PO TBDP
4.0000 mg | ORAL_TABLET | Freq: Once | ORAL | Status: AC
  Administered 2024-07-23: 4 mg via ORAL
  Filled 2024-07-23: qty 1

## 2024-07-23 MED ORDER — DIPHENHYDRAMINE HCL 50 MG/ML IJ SOLN
50.0000 mg | Freq: Three times a day (TID) | INTRAMUSCULAR | Status: DC | PRN
  Administered 2024-07-23: 50 mg via INTRAMUSCULAR

## 2024-07-23 MED ORDER — OLANZAPINE 10 MG PO TBDP: 10.0000 mg | ORAL_TABLET | Freq: Once | ORAL | Status: DC

## 2024-07-23 NOTE — ED Provider Notes (Cosign Needed Addendum)
 [facility]gent Care Continuous Assessment Admission H&P  Date: 07/23/24 Patient Name: Anthony Wiggins MRN: [id number] Chief Complaint: Insomnia  Diagnoses:  Final diagnoses:  None    Total Time spent with patient: 1 hour  HPI:  Anthony Wiggins is a 39 y.o. male  with a past psychiatric history of methamphetamine use disorder, substance induced psychotic disorder, substance induced mood disorder, and schizophrenia presents to [facility] on 07/23/2024 voluntarily accompanied by Anthony Wiggins due to insomnia. He presented to [facility] yesterday evening and then [facility] for similar complaints and was discharged with hydroxyzine .   Patient reports having problems with sleep for past 2 days although Anthony Wiggins reports it has been 3-4 days. He provides limited history due to somnolence and poor inattention. 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 He would not disclose how much methamphetamine he uses or how frequently. He states he just needs some medications to help him sleep. He feels tired at this time but feels he cannot fall asleep. He denies psychotic symptoms at this time although will have pauses in speech.  Based on discussions, he reluctantly agreed to be observed for 24 hours to be restarted on medication and then determine disposition tomorrow based on needs and residual symptoms.   Collateral Anthony Wiggins Versa Kitty) was spoken to in person She reported patient's girlfriend whom patient lives with called Anthony Wiggins yesterday due to patient acting erratically and not sleeping for at least a couple of days. Anthony Wiggins suspects either significant substance use or mental health problem as he has previously been diagnosed with schizophrenia. Anthony Wiggins took patient to [facility] and ED and had him stay with her overnight. He took one time dose of hydroxyzine  early this morning but it was ineffective for his sleep and they were unable to pick up prescription as of yet  given pharmacies were still closed. He was seeing Dr. Delynn for psychiatric needs up until a year ago when he decided he no longer needed to be seen or take medications. At that point, he appeared to have been doing well and significantly more stable at that point in time than currently. She reports she keeps in touch with him about 1-2 times per month. She did not note any negative symptoms, delusions, or responding to internal stimuli at that time. She states overnight, he was pacing around the home, responding to internal stimuli, and threatening Anthony Wiggins although he did not appear to know who Anthony Wiggins was at that time. She did eventually get him to agree to return to [facility] to be evaluated.  Current Home Meds: none  Substance Use Hx: Significant for methamphetamine and cocaine abuse. Has been to rehab in the past  Past Psychiatric Hx: Current Psychiatrist: none Current Therapist: none Previous Psychiatric Diagnoses: substance induced psychotic disorder, substance induced mood disorder, cocaine use disorder, methamphetamine use disorder Current psychiatric medications: none Previous medication trials: olanzapine  Psychiatric Hospitalization hx: hospitalized in the [facility] at [facility], once at [facility] 04/2020 for substance induced psychosis, multiple ED visits substance abuse Psychotherapy hx: unknown Neuromodulation history: unknown History of suicide (obtained from HPI): denies History of homicide or aggression (obtained in HPI): denies   Past Medical History: PCP: none Medical Dx: none Medications: none Allergies:  Allergies[1] Hospitalizations: once for [facility] in 2024 Surgeries: none Trauma: none Seizures: none   Family Medical History: Family History  Problem Relation Age of Onset   Diabetes Mother  Hypertension Mother     Family Psychiatric History: Unknown  Social History: Living Situation: lives with girlfriend Social Support: Anthony Wiggins, girlfriend  Access to firearms: denies       Psychiatric Specialty Exam  Presentation General Appearance:  Casual  Eye Contact: Good  Speech: Clear and Coherent  Speech Volume:No data recorded   Mood and Affect  Mood: Anxious  Affect: Congruent   Thought Process  Thought Processes: Coherent  Descriptions of Associations:Intact  Orientation:Full (Time, Place and Person)  Thought Content:WDL  Diagnosis of Schizophrenia or Schizoaffective disorder in past: Yes (Per pt and mother, pt was disgnosed with schizophrenia in 2008 at [facility] before any substance use)  Duration of Psychotic Symptoms: No data recorded Hallucinations:Hallucinations: None  Ideas of Reference:None  Suicidal Thoughts:Suicidal Thoughts: No  Homicidal Thoughts:Homicidal Thoughts: No   Sensorium  Memory: Immediate Fair  Judgment: Fair  Insight: Fair   Art Therapist  Concentration: Fair  Attention Span: Fair  Recall: Fair  Fund of Knowledge: Fair  Language: Fair   Psychomotor Activity  Psychomotor Activity: Psychomotor Activity: Normal   Assets  Assets: Desire for Improvement; Vocational/Educational   Sleep  Sleep: Sleep: Poor Number of Hours of Sleep: 3   Nutritional Assessment (For OBS and FBC admissions only) Has the patient had a weight loss or gain of 10 pounds or more in the last 3 months?: No Has the patient had a decrease in food intake/or appetite?: No Does the patient have dental problems?: No Does the patient have eating habits or behaviors that may be indicators of an eating disorder including binging or inducing vomiting?: No Has the patient recently lost weight without trying?: 0 Has the patient been eating poorly because of a decreased appetite?: 0 Malnutrition Screening Tool Score: 0    Physical Exam Constitutional:      General: He is in acute distress.  HENT:     Head: Normocephalic and atraumatic.     Mouth/Throat:     Comments: Poor dentition with some  signs of bleeding from gum Eyes:     Extraocular Movements: Extraocular movements intact.     Pupils: Pupils are equal, round, and reactive to light.  Cardiovascular:     Rate and Rhythm: Normal rate.     Pulses: Normal pulses.  Pulmonary:     Effort: Pulmonary effort is normal.  Musculoskeletal:        General: Normal range of motion.     Cervical back: Normal range of motion.  Skin:    General: Skin is warm and dry.  Neurological:     Mental Status: He is disoriented.    ROS  Blood pressure (!) 148/106, pulse 93, temperature 99.8 F (37.7 C), temperature source Oral, resp. rate 17, SpO2 99%. There is no height or weight on file to calculate BMI.  Last Labs:  No visits with results within 6 Month(s) from this visit.  Latest known visit with results is:  Admission on 10/09/2022, Discharged on 10/10/2022  Component Date Value Ref Range Status   WBC 10/09/2022 7.4  4.0 - 10.5 K/uL Final   RBC 10/09/2022 4.63  4.22 - 5.81 MIL/uL Final   Hemoglobin 10/09/2022 12.0 (L)  13.0 - 17.0 g/dL Final   HCT 95/83/7975 37.7 (L)  39.0 - 52.0 % Final   MCV 10/09/2022 81.4  80.0 - 100.0 fL Final   MCH 10/09/2022 25.9 (L)  26.0 - 34.0 pg Final   MCHC 10/09/2022 31.8  30.0 - 36.0 g/dL Final  RDW 10/09/2022 14.5  11.5 - 15.5 % Final   Platelets 10/09/2022 539 (H)  150 - 400 K/uL Final   nRBC 10/09/2022 0.0  0.0 - 0.2 % Final   Performed at [facility], [address], [place], [place] [address]   Sodium 10/09/2022 135  135 - 145 mmol/L Final   Potassium 10/09/2022 4.1  3.5 - 5.1 mmol/L Final   Chloride 10/09/2022 103  98 - 111 mmol/L Final   CO2 10/09/2022 22  22 - 32 mmol/L Final   Glucose, Bld 10/09/2022 108 (H)  70 - 99 mg/dL Final   Glucose reference range applies only to samples taken after fasting for at least 8 hours.   BUN 10/09/2022 32 (H)  6 - 20 mg/dL Final   Creatinine, Ser 10/09/2022 1.69 (H)  0.61 - 1.24 mg/dL Final   Calcium 95/83/7975 8.8 (L)  8.9 - 10.3  mg/dL Final   Total Protein 95/83/7975 8.5 (H)  6.5 - 8.1 g/dL Final   Albumin 95/83/7975 3.9  3.5 - 5.0 g/dL Final   AST 95/83/7975 24  15 - 41 U/L Final   ALT 10/09/2022 21  0 - 44 U/L Final   Alkaline Phosphatase 10/09/2022 67  38 - 126 U/L Final   Total Bilirubin 10/09/2022 0.5  0.3 - 1.2 mg/dL Final   GFR, Estimated 10/09/2022 53 (L)  >60 mL/min Final   Comment: (NOTE) Calculated using the CKD-EPI Creatinine Equation (2021)    Anion gap 10/09/2022 10  5 - 15 Final   Performed at [facility], [address] [place], [place] [address]    Allergies: Fish allergy and Naproxen  Medications:  Facility Ordered Medications  Medication   [COMPLETED] hydrOXYzine  (ATARAX ) tablet 25 mg   acetaminophen  (TYLENOL ) tablet 650 mg   alum & mag hydroxide-simeth (MAALOX/MYLANTA) 200-200-20 MG/5ML suspension 30 mL   magnesium  hydroxide (MILK OF MAGNESIA) suspension 30 mL   haloperidol  (HALDOL ) tablet 5 mg   And   diphenhydrAMINE  (BENADRYL ) capsule 50 mg   haloperidol  lactate (HALDOL ) injection 5 mg   And   diphenhydrAMINE  (BENADRYL ) injection 50 mg   And   LORazepam  (ATIVAN ) injection 2 mg   haloperidol  lactate (HALDOL ) injection 10 mg   And   diphenhydrAMINE  (BENADRYL ) injection 50 mg   And   LORazepam  (ATIVAN ) injection 2 mg   hydrOXYzine  (ATARAX ) tablet 25 mg   ascorbic acid  (VITAMIN C ) tablet 1,000 mg   OLANZapine  (ZYPREXA ) tablet 5 mg   OLANZapine  (ZYPREXA ) tablet 5 mg   PTA Medications  Medication Sig   OLANZapine  (ZYPREXA ) 15 MG tablet Take 1 tablet (15 mg total) by mouth daily with supper. (Patient taking differently: Take 15 mg by mouth at bedtime.)   Pseudoeph-CPM-DM-APAP (TYLENOL  COLD & FLU DAY/NIGHT PO) Take 1 tablet by mouth daily as needed (For cold symptoms).       Suicide Risk Assessment   Suicide Risk Assessment:  Suicidal ideation/thoughts:   []  Current         []  Recent         [x]  Denies        []  Remote   []  Chronic  Intention to act  or plan:        []  Current     []  Recent [x]  Denies  []  Remote  Preparatory behavior:  []  Recent    [x]  Denies Recent      []  Remote Instance  Suicide attempts:  []  Immediately prior to this admission     []   During this admission    []  Recent    []  Multiple   []  Remote    [x]  Denies Ever     Risk Factors  Protective Factors  Acute  Escalating substance use, Acute insomnia, and Acute intoxication AcuteSuicideProtectiveFactors: No access to highly lethal means, Denies current SI or Intent, No recent suicide attempts, No recent self-harm behavior, No significant recent loss (job, relationship, family member), and Not in acute distress  Chronic Poor coping or problem solving skills, Pattern of impulsivity/recklessness, and Male sex No previous suicide attempt, No previous self-harm behaviors, Engaged in community activities, and Age (25-59)   Potential future factors that may impact risk: FutureSuicideFactors : None  Summary: While it is impossible to accurately predict with absolute certainty future events and human behaviors, an assessment of current suicidal indicators, risk factors, and protective factors suggests that this patient's:   Acute suicide risk is: minimal in degree.    Chronic suicide risk is: minimal in degree.   Suicide risk increases with substance/alcohol use and acute intoxication.  Medical Decision Making  Patient's symptoms and assessment consistent with exacerbation of psychotic symptoms possibly due to underlying primary thought disorder vs substance induced mood disorder. UDS negative although it may have been too long since last use. Plan to restart olanzapine  as patient appears to have done well with this medication in the past. Will monitor for residual symptoms of psychosis or mania. Will need to be re-evaluated tomorrow to see if there is need for inpatient. IVC if he attempts to leave AMA and admitting to observation unit to reduce risk of requiring higher level  of care.   Medications: -olanzapine  5 mg once now + 5 mg at bedtime for insomnia and manic symptoms -agitation PRNs available -hydroxyzine  25 mg tid prn for anxiety  Safety and Monitoring: voluntarily admission to [facility]/Observation unit for safety, stabilization and treatment Daily contact with patient to assess and evaluate symptoms and progress in treatment Observation Level : q15 minute checks Vital signs: q12 hours Precautions: elopement and fall    Prentice Espy, MD 07/23/24  9:26 AM     [1]  Allergies Allergen Reactions   Fish Allergy Nausea And Vomiting   Naproxen Rash

## 2024-07-23 NOTE — ED Provider Notes (Signed)
 " [place] EMERGENCY DEPARTMENT AT [facility] Provider Note   CSN: [id number] Arrival date & time: 07/23/24  9980     Patient presents with: Insomnia   Anthony Wiggins is a 39 y.o. male with history of polysubstance abuse, tobacco use, cocaine use, methamphetamine abuse.  Patient presents to ED complaining of insomnia.  Reports he has not been able to sleep in the last 3 days.  He was seen prior to arrival at the behavioral health urgent care by provider Gaither Pouch NP.  Patient was offered one-time dose of hydroxyzine  at that time which the patient refused.  The patient was advised to follow-up with his outpatient provider and he was discharged from the [facility].  Patient presents to ED stating that he was standing from [facility] however I spoke with Gaither Pouch, NP directly who reports that he did not advise patient to do this.  The patient reports that he is here requesting something to help him sleep.  He does endorse recent methamphetamine use.  He denies SI, HI, AVH.  He does arrive hypertensive but denies a chest pain, shortness of breath, headache or blurred vision.   Insomnia       Prior to Admission medications  Medication Sig Start Date End Date Taking? Authorizing Provider  hydrOXYzine  (ATARAX ) 25 MG tablet Take 1 tablet (25 mg total) by mouth every 6 (six) hours. 07/23/24  Yes Ruthell Lonni FALCON, PA-C  OLANZapine  (ZYPREXA ) 15 MG tablet Take 1 tablet (15 mg total) by mouth daily with supper. Patient taking differently: Take 15 mg by mouth at bedtime. 06/20/22   Rankin, Shuvon B, NP  Pseudoeph-CPM-DM-APAP (TYLENOL  COLD & FLU DAY/NIGHT PO) Take 1 tablet by mouth daily as needed (For cold symptoms).    [provider]    Allergies: Fish allergy and Naproxen    Review of Systems  Psychiatric/Behavioral:  The patient has insomnia.   All other systems reviewed and are negative.   Updated Vital Signs BP (!) 178/90 (BP Location: Left Arm)   Pulse 89   Temp 99.5  F (37.5 C) (Oral)   Resp 18   Ht 5' 10 (1.778 m)   Wt 68 kg   SpO2 100%   BMI 21.52 kg/m   Physical Exam Vitals and nursing note reviewed.  Constitutional:      General: He is not in acute distress.    Appearance: He is well-developed.  HENT:     Head: Normocephalic and atraumatic.  Eyes:     Conjunctiva/sclera: Conjunctivae normal.  Cardiovascular:     Rate and Rhythm: Normal rate and regular rhythm.     Heart sounds: No murmur heard. Pulmonary:     Effort: Pulmonary effort is normal. No respiratory distress.     Breath sounds: Normal breath sounds.  Abdominal:     Palpations: Abdomen is soft.     Tenderness: There is no abdominal tenderness.  Musculoskeletal:        General: No swelling.     Cervical back: Neck supple.  Skin:    General: Skin is warm and dry.     Capillary Refill: Capillary refill takes less than 2 seconds.  Neurological:     Mental Status: He is alert.  Psychiatric:        Mood and Affect: Mood normal.     Comments: Calm and cooperative.  Not responding to internal stimuli.  No SI, HI, AVH.     (all labs ordered are listed, but only abnormal  results are displayed) Labs Reviewed - No data to display  EKG: None  Radiology: No results found.  Procedures   Medications Ordered in the ED  hydrOXYzine  (ATARAX ) tablet 25 mg (has no administration in time range)     Medical Decision Making  This is a 39 year old male presenting to the ED due to concerns of insomnia.  He does endorse recent methamphetamine use which is most likely causing his insomnia.  On exam, he is hypertensive but denies chest pain, shortness of breath, headache, blurry vision.  He is nontachycardic.  Afebrile.  Lung sounds clear bilaterally, no hypoxia.  Abdomen soft and compressible.  Neuroexam at baseline.  Patient denies SI, HI, AVH.  Patient was given a one-time dose of hydroxyzine  here.  Patient was sent home with prescription for hydroxyzine  as well as substance  abuse treatment centers in the area.  Patient was advised that the most likely cause of his insomnia is due to his methamphetamine use and he did have enough insight to agree with this.  Patient was advised to follow-up outpatient with substance abuse treatment centers.  Was advised to take hydroxyzine  for insomnia at home.  He was given return precautions and he voiced understanding.  Stable to discharge.   Final diagnoses:  Drug-induced insomnia [facility])    ED Discharge Orders          Ordered    hydrOXYzine  (ATARAX ) 25 MG tablet  Every 6 hours        07/23/24 0216               Ruthell Lonni FALCON, PA-C 07/23/24 0216    Trine Raynell Moder, MD 07/23/24 [phone]  "

## 2024-07-23 NOTE — ED Triage Notes (Signed)
 Pt reports he hasn't been able to sleep well in 2 days, sent from [facility]

## 2024-07-23 NOTE — ED Notes (Signed)
"   Patient was eating Cheez-Its and subsequently vomited. Emesis was yellow-tinged. Patient then took a shower. Provider Dr. Cole notified.  "

## 2024-07-23 NOTE — ED Notes (Signed)
 Pt was on his bed started screaming, got off his bed and started rolling on the floor screaming. Pt looked scared, looking up at the ceiling. Staff with pt, pt denies chest pain. Continues to scream loudly unable to verbally descalate. Provider Dr. Cole notified. Pt given I'm med per doctors orders, refer to [facility]. Pt was able to stop screaming, calm, sat down while staff remained with patient at this time, verbally descalating.

## 2024-07-23 NOTE — ED Notes (Signed)
 Pt vomited, yellow liquid. Medicated with PRN med

## 2024-07-23 NOTE — Discharge Instructions (Addendum)
 It was a pleasure taking part in your care.  As we discussed, the most likely cause of your insomnia is due to your ongoing methamphetamine use.  Please review attached resources for outpatient and inpatient substance abuse treatment centers.  Please begin taking hydroxyzine , 1 tablet, every 6 hours as needed for sleep.  You may follow-up with your PCP as an outpatient.  You may return to the ED with any new or worsening symptoms.

## 2024-07-23 NOTE — BH Assessment (Signed)
 Comprehensive Clinical Assessment (CCA) Note  07/23/2024 Anthony Wiggins [id number]  DISPOSITION: Per Dr. Lynnette, Pt is recommended for observation in the Observation Unit at [facility] and reassessment.   The patient demonstrates the following risk factors for suicide: Chronic risk factors for suicide include: psychiatric disorder of schizophrenia and substance use disorder. Acute risk factors for suicide include: N/A. Protective factors for this patient include: positive social support, responsibility to others (children, family), and hope for the future. Considering these factors, the overall suicide risk at this point appears to be low. Patient is appropriate for outpatient follow up.   Per Triage assessment:  Anthony Wiggins 39y male presents to [facility] accompanied by his mother, his legal guardian. Pt states that he's diagnosed w/ schizonphrenia; no medications are being taken. PT states that today he is here for sleep medications. PT states he has been dealing w/ this issue for the last 2 days. PT discloses a hx of meth use. PT's mother interjects during triage stating that the pt was running through the house in a paranoid state this morning out of his mind. PT denies SI, HI, AVH and alcohol and substance use.  Pt unable to identify immediate stressors related to insomnia. Per chart review, pt has history of polysubstance use and psychosis. Pt denies being established with outpatient therapy or medication management at this time. Pt currently denies SI,HI,AVH and substance/alcohol use.  Pt is a 39 yo male who presented voluntarily accompanied by his mother and legal guardian, Anthony Wiggins. Pt reported he has not been able to sleep for days. Although pt has a hx of recent methamphetamine use, he stated that his last use was about 8 days ago. Pt denied any other substance use since then. Pt stated that he was diagnosed with schizophrenia at [facility] in 2008 while inpatient there. Pt denied being  prescribed any medications and having any OP provider support. Pt stated that he has been psychiatrically hospitalized multiple time with the last hospitalization occurring years ago. Pt denied SI, HI, NSSH, AVH and paranoia. Pt denied any access to firearms and any current legal issues outstanding.   Pt stated that he lives with the mother of one of his children and 2 of his 3 children. Pt stated that one of his children attends [employer]. 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 Pt completed a GED and stated he has had a normal appetite recently. Pt has a hx of substance-induced psychosis and cocaine use.    Chief Complaint: not sleeping  Visit Diagnosis:  Insomnia    CCA Screening, Triage and Referral (STR)  Patient Reported Information How did you hear about us ? Family/Friend  What Is the Reason for Your Visit/Call Today? Anthony Wiggins 39y male presents to [facility] accompanied by his mother, his legal guardian. Pt states that he's diagnosed w/ schizonphrenia; no medications are being taken. PT states that today he is here for sleep medications. PT states he has been dealing w/ this issue for the last 2 days. PT discloses a hx of meth use. PT's mother interjects during triage stating that the pt was running through the house in a paranoid state this morning out of his mind. PT denies SI, HI, AVH and alcohol and substance use.  How Long Has This Been Causing You Problems? <Week  What Do You Feel Would Help You the Most Today? Treatment for Depression or other mood problem; Medication(s)   Have You Recently Had Any Thoughts About Hurting  Yourself? No  Are You Planning to Commit Suicide/Harm Yourself At This time? No   Flowsheet Row ED from 07/23/2024 in [facility] Most recent reading at 07/23/2024  8:11 AM ED from 07/23/2024 in [facility] Emergency Department at [facility] Most recent reading at 07/23/2024 12:29 AM ED from  07/22/2024 in [facility] Most recent reading at 07/22/2024 10:36 PM  C-SSRS RISK CATEGORY No Risk No Risk No Risk    Have you Recently Had Thoughts About Hurting Someone Anthony Wiggins? No  Are You Planning to Harm Someone at This Time? No  Explanation: na  Have You Used Any Alcohol or Drugs in the Past 24 Hours? No  How Long Ago Did You Use Drugs or Alcohol? na What Did You Use and How Much? na  Do You Currently Have a Therapist/Psychiatrist? No  Name of Therapist/Psychiatrist: na  Have You Been Recently Discharged From Any Office Practice or Programs? No  Explanation of Discharge From Practice/Program: na    CCA Screening Triage Referral Assessment Type of Contact: Face-to-Face  Telemedicine Service Delivery:   Is this Initial or Reassessment?   Date Telepsych consult ordered in [facility]:    Time Telepsych consult ordered in [facility]:    Location of Assessment: [facility] Assessment Services  Provider Location: [facility] Assessment Services   Collateral Involvement: mother and legal guardian, Anthony Wiggins, was present and participated in assessment   Does Patient Have a Court Appointed Legal Guardian? Yes  Legal Guardian Contact Information: Anthony Wiggins, mother  Copy of Legal Guardianship Form: No data recorded Legal Guardian Notified of Arrival: -- (mother is aware)  Legal Guardian Notified of Pending Discharge: -- (na)  If Minor and Not Living with Parent(s), Who has Custody? adult  Is CPS involved or ever been involved? In the Past  Is APS involved or ever been involved? Never   Patient Determined To Be At Risk for Harm To Self or Others Based on Review of Patient Reported Information or Presenting Complaint? No  Method: No Plan  Availability of Means: No access or NA  Intent: Vague intent or NA  Notification Required: No need or identified person  Additional Information for Danger to Others Potential: -- (no visible psychotic symptoms  currently)  Additional Comments for Danger to Others Potential: none  Are There Guns or Other Weapons in Your Home? No (pt denied)  Types of Guns/Weapons: na  Are These Weapons Safely Secured?                            -- (na)  Who Could Verify You Are Able To Have These Secured: na  Do You Have any Outstanding Charges, Pending Court Dates, Parole/Probation? pt denied  Contacted To Inform of Risk of Harm To Self or Others: -- (na)    Does Patient Present under Involuntary Commitment? No    [place] of Residence: [place]   Patient Currently Receiving the Following Services: Not Receiving Services   Determination of Need: Urgent (48 hours) (Per Dr. Lynnette, pt is recommended for observation in the [facility] Observation Unit and reassessment)   Options For Referral: [facility]     CCA Biopsychosocial Patient Reported Schizophrenia/Schizoaffective Diagnosis in Past: Yes (Per pt and mother, pt was disgnosed with schizophrenia in 2008 at [facility] before any substance use)   Strengths: able to ask for and accept help   Mental Health Symptoms Depression:  Fatigue  Duration of Depressive symptoms: Duration of Depressive Symptoms: Less than two weeks   Mania:  None   Anxiety:   None   Psychosis:  None   Duration of Psychotic symptoms:    Trauma:  None   Obsessions:  None   Compulsions:  None   Inattention:  N/A   Hyperactivity/Impulsivity:  N/A   Oppositional/Defiant Behaviors:  N/A   Emotional Irregularity:  Mood lability   Other Mood/Personality Symptoms:  none    Mental Status Exam Appearance and self-care  Stature:  Average   Weight:  Thin   Clothing:  Casual   Grooming:  Normal   Cosmetic use:  None   Posture/gait:  Slumped   Motor activity:  Not Remarkable   Sensorium  Attention:  Distractible   Concentration:  Normal   Orientation:  X5   Recall/memory:  Normal   Affect and Mood  Affect:  Flat   Mood:  Dysphoric (stating  cannot sleep and has not slept for days)   Relating  Eye contact:  Normal   Facial expression:  Constricted; Tense   Attitude toward examiner:  Cooperative   Thought and Language  Speech flow: Normal   Thought content:  Appropriate to Mood and Circumstances   Preoccupation:  None   Hallucinations:  None   Organization:  Goal-directed; Linear   Company Secretary of Knowledge:  Fair   Intelligence:  Average   Abstraction:  Normal   Judgement:  Fair   Dance Movement Psychotherapist:  Adequate   Insight:  Fair   Decision Making:  Vacilates   Social Functioning  Social Maturity:  Impulsive   Social Judgement:  Normal   Stress  Stressors:  Family conflict   Coping Ability:  Overwhelmed   Skill Deficits:  Self-care; Self-control   Supports:  Family; Friends/Service system; Support needed     Religion: Religion/Spirituality Are You A Religious Person?: Yes What is Your Religious Affiliation?: Christian How Might This Affect Treatment?: unknown  Leisure/Recreation: Leisure / Recreation Do You Have Hobbies?: Yes Leisure and Hobbies: Basketball, fishing, playing cards  Exercise/Diet: Exercise/Diet Do You Exercise?: Yes What Type of Exercise Do You Do?: Run/Walk How Many Times a Week Do You Exercise?: 1-3 times a week Have You Gained or Lost A Significant Amount of Weight in the Past Six Months?: No Do You Follow a Special Diet?: No Do You Have Any Trouble Sleeping?: Yes Explanation of Sleeping Difficulties: Pt stated he has not slep in several days.   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.) Work Stressors: inconsistent work Patient's Job has Been Impacted by Current Illness: No Has Patient ever Been in Equities Trader?: No  Education: Education Is Patient Currently Attending School?: No Last Grade Completed: 11 (completed  GED) Did You Attend College?: No Did You Have An Individualized Education Program (IIEP): No Did You Have Any Difficulty At [employer]?: No Patient's Education Has Been Impacted by Current Illness: No   CCA Family/Childhood History Family and Relationship History: Family history Marital status: Long term relationship Long term relationship, how long?: 10+ years What types of issues is patient dealing with in the relationship?: Denies having any issues Additional relationship information: n/a Does patient have children?: Yes How many children?: 3 (ages 36, 82 and 5 yo) How is patient's relationship with their children?: good  Childhood History:  Childhood History By whom was/is the patient raised?: Mother/father and step-parent Did  patient suffer any verbal/emotional/physical/sexual abuse as a child?: No Has patient ever been sexually abused/assaulted/raped as an adolescent or adult?: No Witnessed domestic violence?: No Has patient been affected by domestic violence as an adult?: No       CCA Substance Use Alcohol/Drug Use: Alcohol / Drug Use Pain Medications: see MAR Prescriptions: see MAR Over the Counter: see MAR History of alcohol / drug use?: Yes Longest period of sobriety (when/how long): Unknown Negative Consequences of Use:  (none reported) Withdrawal Symptoms:  (none reported) Substance #1 Name of Substance 1: methamphetamine 1 - Age of First Use: 28 1 - Amount (size/oz): varies 1 - Frequency: pt stated he was not sure 1 - Duration: ongoing 1 - Last Use / Amount: 8 days ago 1 - Method of Aquiring: unknown 1- Route of Use: sniffing                       ASAM's:  Six Dimensions of Multidimensional Assessment  Dimension 1:  Acute Intoxication and/or Withdrawal Potential:   Dimension 1:  Description of individual's past and current experiences of substance use and withdrawal: no withdrawal reported  Dimension 2:  Biomedical Conditions and  Complications:   Dimension 2:  Description of patient's biomedical conditions and  complications: none reported  Dimension 3:  Emotional, Behavioral, or Cognitive Conditions and Complications:  Dimension 3:  Description of emotional, behavioral, or cognitive conditions and complications: Hx of schizophrenia without medication or OP provider support  Dimension 4:  Readiness to Change:     Dimension 5:  Relapse, Continued use, or Continued Problem Potential:     Dimension 6:  Recovery/Living Environment:     ASAM Severity Score: ASAM's Severity Rating Score: 9  ASAM Recommended Level of Treatment: ASAM Recommended Level of Treatment: Level II Partial Hospitalization Treatment   Substance use Disorder (SUD) Substance Use Disorder (SUD)  Checklist Symptoms of Substance Use: Continued use despite having a persistent/recurrent physical/psychological problem caused/exacerbated by use, Continued use despite persistent or recurrent social, interpersonal problems, caused or exacerbated by use, Recurrent use that results in a failure to fulfill major role obligations (work, school, home)  Recommendations for Services/Supports/Treatments: Recommendations for Services/Supports/Treatments Recommendations For Services/Supports/Treatments: CD-IOP Intensive Chemical Dependency Program  Disposition Recommendation per psychiatric provider: We recommend transfer to [facility]. Per Dr. Lynnette, Pt is recommended for observation in the Observation Unit at [facility] and reassessment.    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

## 2024-07-23 NOTE — ED Notes (Signed)
 Patient alert & oriented x4. Pt admitted to obs unit. Pt denies intent to harm self or others. Denies AVH. No signs of acute distress noted. No inappropriate behaviors observed or reported. Encouraged patient to notify staff if any thoughts of harm towards self or others arise. Patient verbalizes understanding and agreement. Safety precautions in place. Safety check per facility policy. Eating and fluid intake were adequate. Medications administered as ordered; no adverse effects noted.

## 2024-07-23 NOTE — Progress Notes (Signed)
" °   07/23/24 0803  BHUC Triage Screening (Walk-ins at [facility] only)  What Is the Reason for Your Visit/Call Today? Michelle Vanhise 38y male presents to [facility] accompanied by his mother, his legal guardian. Pt states that he's diagnosed w/ schizonphrenia; no medications are being taken. PT states that today he is here for sleep medications. PT states he has been dealing w/ this issue for the last 2 days. PT discloses a hx of meth use. PT's mother interjects during triage stating that the pt was running through the house in a paranoid state this morning out of his mind. PT denies SI, HI, AVH and alcohol and substance use.  How Long Has This Been Causing You Problems? <Week  Have You Recently Had Any Thoughts About Hurting Yourself? No  Are You Planning to Commit Suicide/Harm Yourself At This time? No  Have you Recently Had Thoughts About Hurting Someone [name]? No  Are You Planning To Harm Someone At This Time? No  Physical Abuse Denies  Verbal Abuse Yes, past (Comment)  Sexual Abuse Denies  Exploitation of patient/patient's resources Denies  Self-Neglect Denies  Are you currently experiencing any auditory, visual or other hallucinations? No  Have You Used Any Alcohol or Drugs in the Past 24 Hours? No  Do you have any current medical co-morbidities that require immediate attention? No  Clinician description of patient physical appearance/behavior: cooperative, calm, a little strange  What Do You Feel Would Help You the Most Today? Treatment for Depression or other mood problem;Medication(s)  Determination of Need Routine (7 days)  Options For Referral Medication Management    "

## 2024-07-23 NOTE — ED Notes (Signed)
 Pt awake, alert. Voices c/o nausea. Denies SI/HI/AVH. He is calm, pleasant, slow to respond. No noted distress. Environmental check for safety complete.

## 2024-07-24 ENCOUNTER — Inpatient Hospital Stay (HOSPITAL_COMMUNITY): Admission: EM | Admit: 2024-07-24 | Discharge: 2024-07-27 | DRG: 917 | Disposition: A | Payer: MEDICAID

## 2024-07-24 ENCOUNTER — Other Ambulatory Visit: Payer: Self-pay

## 2024-07-24 ENCOUNTER — Emergency Department (HOSPITAL_COMMUNITY): Payer: MEDICAID

## 2024-07-24 ENCOUNTER — Inpatient Hospital Stay (HOSPITAL_COMMUNITY): Payer: MEDICAID

## 2024-07-24 DIAGNOSIS — E785 Hyperlipidemia, unspecified: Secondary | ICD-10-CM | POA: Diagnosis present

## 2024-07-24 DIAGNOSIS — K029 Dental caries, unspecified: Secondary | ICD-10-CM | POA: Diagnosis present

## 2024-07-24 DIAGNOSIS — F209 Schizophrenia, unspecified: Secondary | ICD-10-CM | POA: Diagnosis not present

## 2024-07-24 DIAGNOSIS — R4182 Altered mental status, unspecified: Principal | ICD-10-CM | POA: Diagnosis present

## 2024-07-24 DIAGNOSIS — Z91148 Patient's other noncompliance with medication regimen for other reason: Secondary | ICD-10-CM

## 2024-07-24 DIAGNOSIS — F17210 Nicotine dependence, cigarettes, uncomplicated: Secondary | ICD-10-CM | POA: Diagnosis present

## 2024-07-24 DIAGNOSIS — D72829 Elevated white blood cell count, unspecified: Secondary | ICD-10-CM | POA: Diagnosis not present

## 2024-07-24 DIAGNOSIS — E876 Hypokalemia: Secondary | ICD-10-CM | POA: Diagnosis present

## 2024-07-24 DIAGNOSIS — Z91013 Allergy to seafood: Secondary | ICD-10-CM

## 2024-07-24 DIAGNOSIS — R400 Somnolence: Secondary | ICD-10-CM

## 2024-07-24 DIAGNOSIS — I10 Essential (primary) hypertension: Secondary | ICD-10-CM | POA: Diagnosis present

## 2024-07-24 DIAGNOSIS — G4700 Insomnia, unspecified: Secondary | ICD-10-CM | POA: Diagnosis not present

## 2024-07-24 DIAGNOSIS — I6389 Other cerebral infarction: Secondary | ICD-10-CM

## 2024-07-24 DIAGNOSIS — G4709 Other insomnia: Secondary | ICD-10-CM | POA: Diagnosis present

## 2024-07-24 DIAGNOSIS — N179 Acute kidney failure, unspecified: Secondary | ICD-10-CM | POA: Diagnosis not present

## 2024-07-24 DIAGNOSIS — Z886 Allergy status to analgesic agent status: Secondary | ICD-10-CM

## 2024-07-24 DIAGNOSIS — G9340 Encephalopathy, unspecified: Secondary | ICD-10-CM | POA: Diagnosis not present

## 2024-07-24 DIAGNOSIS — F1120 Opioid dependence, uncomplicated: Secondary | ICD-10-CM | POA: Diagnosis present

## 2024-07-24 DIAGNOSIS — M6282 Rhabdomyolysis: Secondary | ICD-10-CM | POA: Diagnosis present

## 2024-07-24 DIAGNOSIS — R569 Unspecified convulsions: Secondary | ICD-10-CM

## 2024-07-24 DIAGNOSIS — Z1152 Encounter for screening for COVID-19: Secondary | ICD-10-CM

## 2024-07-24 DIAGNOSIS — R739 Hyperglycemia, unspecified: Secondary | ICD-10-CM | POA: Diagnosis not present

## 2024-07-24 DIAGNOSIS — E860 Dehydration: Secondary | ICD-10-CM | POA: Diagnosis present

## 2024-07-24 DIAGNOSIS — J690 Pneumonitis due to inhalation of food and vomit: Secondary | ICD-10-CM | POA: Diagnosis present

## 2024-07-24 DIAGNOSIS — Z8673 Personal history of transient ischemic attack (TIA), and cerebral infarction without residual deficits: Secondary | ICD-10-CM

## 2024-07-24 DIAGNOSIS — F15159 Other stimulant abuse with stimulant-induced psychotic disorder, unspecified: Secondary | ICD-10-CM | POA: Diagnosis present

## 2024-07-24 DIAGNOSIS — G928 Other toxic encephalopathy: Secondary | ICD-10-CM | POA: Diagnosis present

## 2024-07-24 DIAGNOSIS — F19959 Other psychoactive substance use, unspecified with psychoactive substance-induced psychotic disorder, unspecified: Secondary | ICD-10-CM | POA: Diagnosis present

## 2024-07-24 DIAGNOSIS — Z8249 Family history of ischemic heart disease and other diseases of the circulatory system: Secondary | ICD-10-CM

## 2024-07-24 DIAGNOSIS — F32A Depression, unspecified: Secondary | ICD-10-CM | POA: Diagnosis present

## 2024-07-24 DIAGNOSIS — T40411A Poisoning by fentanyl or fentanyl analogs, accidental (unintentional), initial encounter: Principal | ICD-10-CM | POA: Diagnosis present

## 2024-07-24 DIAGNOSIS — Z833 Family history of diabetes mellitus: Secondary | ICD-10-CM

## 2024-07-24 DIAGNOSIS — R911 Solitary pulmonary nodule: Secondary | ICD-10-CM | POA: Diagnosis present

## 2024-07-24 LAB — ECHOCARDIOGRAM COMPLETE
AR max vel: 2.88 cm2
AV Area VTI: 3.15 cm2
AV Area mean vel: 3.18 cm2
AV Mean grad: 4 mmHg
AV Peak grad: 6.7 mmHg
Ao pk vel: 1.29 m/s
Area-P 1/2: 4.33 cm2
Calc EF: 59 %
Height: 71 in
MV VTI: 2.62 cm2
S' Lateral: 2.5 cm
Single Plane A2C EF: 59.6 %
Single Plane A4C EF: 56.5 %
Weight: 2627.88 [oz_av]

## 2024-07-24 LAB — CBC
HCT: 45.5 % (ref 39.0–52.0)
Hemoglobin: 15.5 g/dL (ref 13.0–17.0)
MCH: 26.5 pg (ref 26.0–34.0)
MCHC: 34.1 g/dL (ref 30.0–36.0)
MCV: 77.9 fL — ABNORMAL LOW (ref 80.0–100.0)
Platelets: 438 10*3/uL — ABNORMAL HIGH (ref 150–400)
RBC: 5.84 MIL/uL — ABNORMAL HIGH (ref 4.22–5.81)
RDW: 13.4 % (ref 11.5–15.5)
WBC: 15.3 10*3/uL — ABNORMAL HIGH (ref 4.0–10.5)
nRBC: 0 % (ref 0.0–0.2)

## 2024-07-24 LAB — COMPREHENSIVE METABOLIC PANEL WITH GFR
ALT: 20 U/L (ref 0–44)
AST: 42 U/L — ABNORMAL HIGH (ref 15–41)
Albumin: 5.2 g/dL — ABNORMAL HIGH (ref 3.5–5.0)
Alkaline Phosphatase: 81 U/L (ref 38–126)
Anion gap: 20 — ABNORMAL HIGH (ref 5–15)
BUN: 18 mg/dL (ref 6–20)
CO2: 25 mmol/L (ref 22–32)
Calcium: 10.1 mg/dL (ref 8.9–10.3)
Chloride: 96 mmol/L — ABNORMAL LOW (ref 98–111)
Creatinine, Ser: 1.48 mg/dL — ABNORMAL HIGH (ref 0.61–1.24)
GFR, Estimated: >60 mL/min
Glucose, Bld: 141 mg/dL — ABNORMAL HIGH (ref 70–99)
Potassium: 3.3 mmol/L — ABNORMAL LOW (ref 3.5–5.1)
Sodium: 141 mmol/L (ref 135–145)
Total Bilirubin: 0.6 mg/dL (ref 0.0–1.2)
Total Protein: 9.2 g/dL — ABNORMAL HIGH (ref 6.5–8.1)

## 2024-07-24 LAB — URINE DRUG SCREEN
Amphetamines: NEGATIVE
Barbiturates: NEGATIVE
Benzodiazepines: POSITIVE — AB
Cocaine: NEGATIVE
Fentanyl: POSITIVE — AB
Methadone Scn, Ur: NEGATIVE
Opiates: NEGATIVE
Tetrahydrocannabinol: NEGATIVE

## 2024-07-24 LAB — URINALYSIS, ROUTINE W REFLEX MICROSCOPIC
Bilirubin Urine: NEGATIVE
Glucose, UA: NEGATIVE mg/dL
Ketones, ur: 40 mg/dL — AB
Leukocytes,Ua: NEGATIVE
Nitrite: NEGATIVE
Protein, ur: >300 mg/dL — AB
Specific Gravity, Urine: 1.015 (ref 1.005–1.030)
pH: 5.5 (ref 5.0–8.0)

## 2024-07-24 LAB — URINALYSIS, MICROSCOPIC (REFLEX): Squamous Epithelial / HPF: NONE SEEN /HPF (ref 0–5)

## 2024-07-24 LAB — RESP PANEL BY RT-PCR (RSV, FLU A&B, COVID)  RVPGX2
Influenza A by PCR: NEGATIVE
Influenza B by PCR: NEGATIVE
Resp Syncytial Virus by PCR: NEGATIVE
SARS Coronavirus 2 by RT PCR: NEGATIVE

## 2024-07-24 LAB — OSMOLALITY: Osmolality: 309 mosm/kg — ABNORMAL HIGH (ref 275–295)

## 2024-07-24 LAB — ETHANOL: Alcohol, Ethyl (B): <15 mg/dL

## 2024-07-24 LAB — GC/CHLAMYDIA PROBE AMP (~~LOC~~) NOT AT ARMC
Chlamydia: NEGATIVE
Comment: NEGATIVE
Comment: NORMAL
Neisseria Gonorrhea: NEGATIVE

## 2024-07-24 LAB — MAGNESIUM: Magnesium: 2.4 mg/dL (ref 1.7–2.4)

## 2024-07-24 LAB — MISC LABCORP TEST (SEND OUT): Labcorp test code: 83935

## 2024-07-24 LAB — LIPASE, BLOOD: Lipase: 24 U/L (ref 11–51)

## 2024-07-24 LAB — CK: Total CK: 1372 U/L — ABNORMAL HIGH (ref 49–397)

## 2024-07-24 LAB — ACETAMINOPHEN LEVEL: Acetaminophen (Tylenol), Serum: <10 ug/mL — ABNORMAL LOW (ref 10–30)

## 2024-07-24 LAB — PROTIME-INR
INR: 1.1 (ref 0.8–1.2)
Prothrombin Time: 14.5 s (ref 11.4–15.2)

## 2024-07-24 LAB — AMMONIA: Ammonia: <13 umol/L (ref 9–35)

## 2024-07-24 LAB — HIV ANTIBODY (ROUTINE TESTING W REFLEX): HIV Screen 4th Generation wRfx: NONREACTIVE

## 2024-07-24 LAB — SEDIMENTATION RATE: Sed Rate: 12 mm/h (ref 0–16)

## 2024-07-24 LAB — BETA-HYDROXYBUTYRIC ACID: Beta-Hydroxybutyric Acid: 1.87 mmol/L — ABNORMAL HIGH (ref 0.05–0.27)

## 2024-07-24 LAB — GLUCOSE, CAPILLARY: Glucose-Capillary: 142 mg/dL — ABNORMAL HIGH (ref 70–99)

## 2024-07-24 LAB — APTT: aPTT: 26 s (ref 24–36)

## 2024-07-24 LAB — CG4 I-STAT (LACTIC ACID): Lactic Acid, Venous: 1.2 mmol/L (ref 0.5–1.9)

## 2024-07-24 LAB — T.PALLIDUM AB, TOTAL: T Pallidum Abs: NONREACTIVE

## 2024-07-24 LAB — SALICYLATE LEVEL: Salicylate Lvl: <7 mg/dL — ABNORMAL LOW (ref 7.0–30.0)

## 2024-07-24 LAB — MRSA NEXT GEN BY PCR, NASAL: MRSA by PCR Next Gen: NOT DETECTED

## 2024-07-24 MED ORDER — SODIUM CHLORIDE 0.9 % IV SOLN
2.0000 g | INTRAVENOUS | Status: DC
  Administered 2024-07-24: 2 g via INTRAVENOUS
  Filled 2024-07-24 (×2): qty 2000

## 2024-07-24 MED ORDER — NALOXONE HCL 4 MG/10ML IJ SOLN
0.0000 mg/h | INTRAVENOUS | Status: DC
  Administered 2024-07-24: 0.25 mg/h via INTRAVENOUS
  Filled 2024-07-24: qty 10

## 2024-07-24 MED ORDER — SODIUM CHLORIDE 0.9 % IV SOLN
2.0000 g | INTRAVENOUS | Status: DC
  Administered 2024-07-24 – 2024-07-27 (×4): 2 g via INTRAVENOUS
  Filled 2024-07-24 (×3): qty 20

## 2024-07-24 MED ORDER — NALOXONE HCL 0.4 MG/ML IJ SOLN: 0.4000 mg | INTRAMUSCULAR | Status: DC | PRN

## 2024-07-24 MED ORDER — THIAMINE HCL 100 MG/ML IJ SOLN: 200.0000 mg | INTRAVENOUS | Status: DC

## 2024-07-24 MED ORDER — FOLIC ACID 1 MG PO TABS
1.0000 mg | ORAL_TABLET | Freq: Every day | ORAL | Status: DC
  Administered 2024-07-24 – 2024-07-27 (×4): 1 mg via ORAL
  Filled 2024-07-24 (×4): qty 1

## 2024-07-24 MED ORDER — SODIUM CHLORIDE 0.9 % IV BOLUS
1000.0000 mL | Freq: Once | INTRAVENOUS | Status: AC
  Administered 2024-07-24: 1000 mL via INTRAVENOUS

## 2024-07-24 MED ORDER — LACTATED RINGERS IV BOLUS
1000.0000 mL | Freq: Once | INTRAVENOUS | Status: AC
  Administered 2024-07-24: 1000 mL via INTRAVENOUS

## 2024-07-24 MED ORDER — LABETALOL HCL 5 MG/ML IV SOLN: 10.0000 mg | INTRAVENOUS | Status: DC | PRN

## 2024-07-24 MED ORDER — ADULT MULTIVITAMIN W/MINERALS CH
1.0000 | ORAL_TABLET | Freq: Every day | ORAL | Status: DC
  Administered 2024-07-24 – 2024-07-27 (×4): 1 via ORAL
  Filled 2024-07-24 (×4): qty 1

## 2024-07-24 MED ORDER — THIAMINE HCL 100 MG/ML IJ SOLN
400.0000 mg | Freq: Three times a day (TID) | INTRAVENOUS | Status: AC
  Administered 2024-07-24 – 2024-07-26 (×6): 400 mg via INTRAVENOUS
  Filled 2024-07-24 (×7): qty 4

## 2024-07-24 MED ORDER — NALOXONE HCL 0.4 MG/ML IJ SOLN
INTRAMUSCULAR | Status: AC
  Administered 2024-07-24: 0.4 mg via INTRAVENOUS
  Filled 2024-07-24: qty 1

## 2024-07-24 MED ORDER — OLANZAPINE 5 MG PO TABS
5.0000 mg | ORAL_TABLET | Freq: Every day | ORAL | Status: DC
  Administered 2024-07-24 – 2024-07-26 (×3): 5 mg via ORAL
  Filled 2024-07-24 (×4): qty 1

## 2024-07-24 MED ORDER — IOHEXOL 350 MG/ML SOLN
75.0000 mL | Freq: Once | INTRAVENOUS | Status: AC | PRN
  Administered 2024-07-24: 75 mL via INTRAVENOUS

## 2024-07-24 MED ORDER — ONDANSETRON HCL 4 MG/2ML IJ SOLN
4.0000 mg | Freq: Four times a day (QID) | INTRAMUSCULAR | Status: DC | PRN
  Administered 2024-07-25: 4 mg via INTRAVENOUS
  Filled 2024-07-24: qty 2

## 2024-07-24 MED ORDER — MAGNESIUM SULFATE 2 GM/50ML IV SOLN
2.0000 g | Freq: Once | INTRAVENOUS | Status: AC
  Administered 2024-07-24: 2 g via INTRAVENOUS
  Filled 2024-07-24: qty 50

## 2024-07-24 MED ORDER — POLYETHYLENE GLYCOL 3350 17 G PO PACK: 17.0000 g | PACK | Freq: Every day | ORAL | Status: DC | PRN

## 2024-07-24 MED ORDER — SENNA 8.6 MG PO TABS: 1.0000 | ORAL_TABLET | Freq: Two times a day (BID) | ORAL | Status: DC | PRN

## 2024-07-24 MED ORDER — LORAZEPAM 2 MG/ML IJ SOLN
1.0000 mg | Freq: Once | INTRAMUSCULAR | Status: AC | PRN
  Administered 2024-07-24: 1 mg via INTRAVENOUS
  Filled 2024-07-24: qty 1

## 2024-07-24 MED ORDER — ENOXAPARIN SODIUM 40 MG/0.4ML IJ SOSY
40.0000 mg | PREFILLED_SYRINGE | INTRAMUSCULAR | Status: DC
  Administered 2024-07-24 – 2024-07-27 (×4): 40 mg via SUBCUTANEOUS
  Filled 2024-07-24 (×4): qty 0.4

## 2024-07-24 MED ORDER — LACTATED RINGERS IV SOLN: INTRAVENOUS | Status: AC

## 2024-07-24 MED ORDER — METOPROLOL TARTRATE 5 MG/5ML IV SOLN
2.5000 mg | INTRAVENOUS | Status: DC | PRN
  Administered 2024-07-24 – 2024-07-25 (×2): 5 mg via INTRAVENOUS
  Filled 2024-07-24 (×3): qty 5

## 2024-07-24 MED ORDER — ORAL CARE MOUTH RINSE: 15.0000 mL | OROMUCOSAL | Status: DC | PRN

## 2024-07-24 MED ORDER — THIAMINE HCL 100 MG/ML IJ SOLN
200.0000 mg | Freq: Three times a day (TID) | INTRAVENOUS | Status: DC
  Administered 2024-07-26 – 2024-07-27 (×3): 200 mg via INTRAVENOUS
  Filled 2024-07-24 (×5): qty 2

## 2024-07-24 MED ORDER — NALOXONE HCL 0.4 MG/ML IJ SOLN
0.4000 mg | Freq: Once | INTRAMUSCULAR | Status: AC
  Administered 2024-07-24: 0.4 mg via INTRAVENOUS
  Filled 2024-07-24: qty 1

## 2024-07-24 MED ORDER — POTASSIUM CHLORIDE CRYS ER 20 MEQ PO TBCR
40.0000 meq | EXTENDED_RELEASE_TABLET | Freq: Once | ORAL | Status: AC
  Administered 2024-07-24: 40 meq via ORAL
  Filled 2024-07-24: qty 2

## 2024-07-24 MED ORDER — SODIUM CHLORIDE 0.9 % IV SOLN
2.0000 g | Freq: Two times a day (BID) | INTRAVENOUS | Status: DC
  Filled 2024-07-24: qty 20

## 2024-07-24 MED ORDER — VANCOMYCIN HCL IN DEXTROSE 1-5 GM/200ML-% IV SOLN: 1000.0000 mg | Freq: Two times a day (BID) | INTRAVENOUS | Status: DC

## 2024-07-24 MED ORDER — CHLORHEXIDINE GLUCONATE CLOTH 2 % EX PADS
6.0000 | MEDICATED_PAD | Freq: Every day | CUTANEOUS | Status: DC
  Administered 2024-07-24 – 2024-07-26 (×3): 6 via TOPICAL

## 2024-07-24 MED ORDER — VANCOMYCIN HCL 2000 MG/400ML IV SOLN
2000.0000 mg | Freq: Once | INTRAVENOUS | Status: DC
  Filled 2024-07-24 (×2): qty 400

## 2024-07-24 MED ORDER — HYDRALAZINE HCL 20 MG/ML IJ SOLN
10.0000 mg | INTRAMUSCULAR | Status: DC | PRN
  Administered 2024-07-24 – 2024-07-25 (×5): 10 mg via INTRAVENOUS
  Filled 2024-07-24 (×5): qty 1

## 2024-07-24 NOTE — ED Notes (Signed)
 This phlebotomist attempted to collect blood cultures. Unable to at this time. RN aware

## 2024-07-24 NOTE — ED Notes (Signed)
 Upon rounding, Pt found to only respond to sternal rub, pt unable to follow commands or respond verbally with pin point pupils, narcan  administered per order and pt became more alert and opens eyes to verbal stimuli. Pt responds no when asked if he is having any pain. Oral temp 101. ED Provider at bedside.

## 2024-07-24 NOTE — ED Notes (Signed)
 Critical care at bedside

## 2024-07-24 NOTE — Progress Notes (Addendum)
 "  NAME:  Anthony Wiggins, MRN:  [id number], DOB:  Nov 18, 1985, LOS: 0 ADMISSION DATE:  07/24/2024 CONSULTATION DATE:  07/24/2024 REFERRING MD:  Theadore - EDP, CHIEF COMPLAINT:  AMS   History of Present Illness:  39 year old man who presented to [facility] ED 1/30 as a transfer from [facility] for AMS. PMHx significant for schizophrenia, substance-induced psychotic disorder, substance-induced mood disorder, substance abuse (methamphetamines, cocaine, tobacco), dental caries.  Patient initially presented to [facility] 1/28 for insomnia x 3 days. Reportedly noncompliant with medications. Denies SI, HI, AV hallucinations, paranoia. Discharged from UC with recommendation for melatonin versus hydroxyzine  x 1 (did not fill). Patient presented to [facility] ED 1/29 early AM for same, noted to be hypertensive but not in distress, given hydroxyzine  x 1 in ED. Patient presented back to [facility] 1/29, was given Zyprexa  x 1 and hydroxyzine  and ativan  and was admitted to [facility]. Patient began vomiting and became stuporous and was sent to [facility] ED for medical clearance.  On [facility] ED arrival, patient was reportedly unresponsive/stuporous, vomiting and had pinpoint pupils and rapid shallow breathing. Marginal response to Narcan  with slight improvement in mental status. EMS administered Narcan  x 2, Zofran . On arrival, patient was febrile to 101F, HR 90, BP 168/107, RR 34, SpO2 100%. Labs were notable for WBC 15.3, Hgb 15.5, Plt 438. Na 141, K 3.3, CO2 25, Cr 1.48 (baseline 1.1-1.3), AST 42, LFTs otherwise WNL. Lipase WNL. Ethanol, salicylates, APAP levels negative. UDS from [facility] negative, repeat pending. CT Head with remote cerebellar strokes, no acute findings. CT Chest with GGOs at bases, CT A/P without acute findings. Narcan  gtt initiated in ED.  PCCM consulted for ICU admission.  Pertinent Medical History:   Past Medical History:  Diagnosis Date   Dental caries    Schizophrenia (HCC)    Significant Hospital Events: Including procedures, antibiotic start  and stop dates in addition to other pertinent events   1/30 - Presented to [facility] ED from [facility] fo AMS, fever, vomiting.  Interim History / Subjective:  Patient is awake following commands He is asking for a fentanyl  drip   Uses fent multiple x / day Hasn't slept much at all in several days Hasn't been eating or drinking -- has appetite problems when he both uses fent and is between fent uses   Ammonia resulted normal   Objective:  Blood pressure (!) 164/125, pulse 88, temperature 99.8 F (37.7 C), temperature source Oral, resp. rate 19, height 5' 11 (1.803 m), weight 74.5 kg, SpO2 97%.        Intake/Output Summary (Last 24 hours) at 07/24/2024 0919 Last data filed at 07/24/2024 0800 Gross per 24 hour  Intake 1029.06 ml  Output --  Net 1029.06 ml   Filed Weights   07/24/24 0128 07/24/24 0657  Weight: 86.2 kg 74.5 kg   Physical Examination: General: acutely and chronically ill M Neuro: lethargic but awake and oriented x3 following commands without focal def. No nystagmus, some saccadic eye movements.  HEENT: NCAT Poor dentition dry appearing mm  CV cap refill < 3 sec  Pulm: even unlabored resp  GI: soft thin  Extremities: no obvious acute joint deformity Skin: warm dry scattered tattoos   Resolved Hospital Problem List:    Assessment & Plan:    Acute encephalopathy -- favor toxic metabolic driving but possibly multifactorial  Polysubstance abuse- daily fent insufflation, intermittent meth use -UDS at [facility] + fent and BZD, BZD explained by [facility] medication administration   Schizophrenia  -w acute psychotic features  vs substance induced psychosis  Insomnia  Med non-adherence Remote posterior superior L cerebellar infarct, remote posterior R cerebellar infarct  P -wean narcan  gtt  -high dose thiamine   -with rapid sx improvement on narcan  gtt + absence of nuchal rigidity + no further fevers, will cancel LP.  -broad abx, de-escalate as cx data allows -syphilis labs  pending  -spot eeg pending, though think low yield as he is awake now  -at bedtime olanzapine  -Note from Gaither Pouch NP indicates pt to return to [facility] facility when medically stable to do so. Ill reach out to psych team on call here to close the loop  Leukocytosis Possible sepsis Hx strep pneumo bacteremia P -bcx pending -MRSA PCR neg dc vanc  -de-escalate meningitis coverage w low clinical suspicion and will do rocephin    AKI Hypokalemia  Dehydration P -LR  -BID BMP/ mag phos  -replace lytes  -adv diet to clear liquids   Suspected starvation ketosis  P -micronutrient support with lyte plan as above   HTN vs acutely elevated BP  HLD P -checking a CK acutely, but prior to dc would start a statin especially in setting of above newly discovered remote infarcts   Pulm nodule P -needs outpt fu   Labs:  CBC: Recent Labs  Lab 07/23/24 0940 07/24/24 0136  WBC 12.2* 15.3*  NEUTROABS 9.5*  --   HGB 15.0 15.5  HCT 44.9 45.5  MCV 78.4* 77.9*  PLT 424* 438*   Basic Metabolic Panel: Recent Labs  Lab 07/23/24 0940 07/24/24 0136  NA 139 141  K 3.1* 3.3*  CL 96* 96*  CO2 25 25  GLUCOSE 114* 141*  BUN 18 18  CREATININE 1.36* 1.48*  CALCIUM  10.2 10.1   GFR: Estimated Creatinine Clearance: 71.3 mL/min (A) (by C-G formula based on SCr of 1.48 mg/dL (H)). Recent Labs  Lab 07/23/24 0940 07/24/24 0136 07/24/24 0645  WBC 12.2* 15.3*  --   LATICACIDVEN  --   --  1.2   Liver Function Tests: Recent Labs  Lab 07/23/24 0940 07/24/24 0136  AST 26 42*  ALT 17 20  ALKPHOS 85 81  BILITOT 0.7 0.6  PROT 9.3* 9.2*  ALBUMIN 5.3* 5.2*   Recent Labs  Lab 07/24/24 0136  LIPASE 24   Recent Labs  Lab 07/24/24 0614  AMMONIA <13    ABG:    Component Value Date/Time   TCO2 29 06/10/2013 2124    Coagulation Profile: Recent Labs  Lab 07/24/24 0614  INR 1.1    Cardiac Enzymes: No results for input(s): CKTOTAL, CKMB, CKMBINDEX, TROPONINI in the last  168 hours.  HbA1C: Hgb A1c MFr Bld  Date/Time Value Ref Range Status  07/23/2024 09:40 AM 6.1 (H) 4.8 - 5.6 % Final    Comment:    (NOTE) Diagnosis of Diabetes The following HbA1c ranges recommended by the American Diabetes Association (ADA) may be used as an aid in the diagnosis of diabetes mellitus.  Hemoglobin             Suggested A1C NGSP%              Diagnosis  <5.7                   Non Diabetic  5.7-6.4                Pre-Diabetic  >6.4                   Diabetic  <7.0  Glycemic control for                       adults with diabetes.    06/14/2022 11:23 AM 6.2 (H) 4.8 - 5.6 % Final    Comment:    (NOTE)         Prediabetes: 5.7 - 6.4         Diabetes: >6.4         Glycemic control for adults with diabetes: <7.0    CBG: Recent Labs  Lab 07/24/24 0024  GLUCAP 142*   Additional critical care time 42 minutes   Critical care time was exclusive of separately billable procedures and treating other patients. Critical care was necessary to treat or prevent imminent or life-threatening deterioration.  Critical care was time spent personally by me on the following activities: development of treatment plan with patient and/or surrogate as well as nursing, discussions with consultants, evaluation of patient's response to treatment, examination of patient, obtaining history from patient or surrogate, ordering and performing treatments and interventions, ordering and review of laboratory studies, ordering and review of radiographic studies, pulse oximetry and re-evaluation of patient's condition.  Ronnald Gave MSN, AGACNP-BC [facility] Amion for pager  07/24/2024, 9:19 AM   "

## 2024-07-24 NOTE — Progress Notes (Signed)
 EEG complete - results pending

## 2024-07-24 NOTE — Procedures (Signed)
 Patient Name: Anthony Wiggins  MRN: [id number]  Epilepsy Attending: Arlin MALVA Krebs  Referring Physician/Provider: Ilah Corean HERO, PA-C  Date: 07/24/2024 Duration: 22.21 mins  Patient history: 39yo M with ams. EEG to evaluate for seizure  Level of alertness: Awake  AEDs during EEG study: Ativan   Technical aspects: This EEG study was done with scalp electrodes positioned according to the 10-20 International system of electrode placement. Electrical activity was reviewed with band pass filter of 1-70Hz , sensitivity of 7 uV/mm, display speed of 15mm/sec with a 60Hz  notched filter applied as appropriate. EEG data were recorded continuously and digitally stored.  Video monitoring was available and reviewed as appropriate.  Description: The posterior dominant rhythm consists of 8-9 Hz activity of moderate voltage (25-35 uV) seen predominantly in posterior head regions, symmetric and reactive to eye opening and eye closing. There is an excessive amount of 15 to 18 Hz beta activity distributed symmetrically and diffusely. Hyperventilation and photic stimulation were not performed.     ABNORMALITY - Excessive beta, generalized  IMPRESSION: This study is within normal limits. The excessive beta activity seen in the background is most likely due to the effect of benzodiazepine and is a benign EEG pattern. No seizures or epileptiform discharges were seen throughout the recording.  A normal interictal EEG does not exclude the diagnosis of epilepsy.   Ysenia Filice O Ahmia Colford

## 2024-07-24 NOTE — ED Provider Notes (Signed)
 Writer was notified by nursing staff that patient continued to have nausea and vomiting at this point he was vomiting green emesis.  Patient is also weak.  Review patient labs show elevated WBC 12.2, RPR reactive.  Writer did reach out to [facility], ED spoke with Dr. Midge MD who will be accepting patient for medical clearance.  Patient can return to facility after medically cleared

## 2024-07-24 NOTE — Progress Notes (Signed)
 Psychiatric Nurse Liaison (PNL) Rounding Note  Current SI/HI/AVH: UTA  Patient Mood/Affect: Disorganized  Noted Patient Behaviors: Fidgety and restless. Patient denies any concerns at present.  Interventions Initiated by Psychiatric Nurse Liaison: Emotional support and therapeutic communication  Recommendations for Patient Care: No new recommendations   Patients Response to Treatment: Effective  Time Spent with Patient:   5 mins

## 2024-07-24 NOTE — ED Notes (Signed)
 EMS  to transport pt to [facility]

## 2024-07-24 NOTE — Progress Notes (Addendum)
 Pharmacy Antibiotic Note  Anthony Wiggins is a 39 y.o. male admitted on 07/24/2024 with overdose.  Pharmacy has been consulted for Vancomycin  dosing.  -WBC 15, sCr 1.48, Tmax 101 -Blood cultures ordered -08/2022: Strep pneumo bacteremia (R-erythromycin) -RPR +   Plan: -Ampicillin  2g IV every 4 hours  -Ceftriaxone  2g IV every 12 hours -Vancomycin  2g IV x1 -Vancomycin  1000mg  IV every 12 hours (target Vanc trough 15-20) -Order vanc levels when appropriate  -Monitor renal function -Follow up signs of clinical improvement, LOT, de-escalation of antibiotics, T. pallidum Ab -F/u IR lumbar puncture   Height: 5' 11 (180.3 cm) Weight: 86.2 kg (190 lb) IBW/kg (Calculated) : 75.3  Temp (24hrs), Avg:100.1 F (37.8 C), Min:99.6 F (37.6 C), Max:101 F (38.3 C)  Recent Labs  Lab 07/23/24 0940 07/24/24 0136  WBC 12.2* 15.3*  CREATININE 1.36* 1.48*    Estimated Creatinine Clearance: 72.1 mL/min (A) (by C-G formula based on SCr of 1.48 mg/dL (H)).    Allergies[1]  Antimicrobials this admission: Ampicillin  1/30 >> Ceftriaxone  1/30 >>  Vancomycin  1/30 >>   Microbiology results: 1/30 BCx:  1/30 MRSA PCR:   Thank you for allowing pharmacy to be a part of this patients care.  Rutha Poplar, PharmD, BCPS Clinical Pharmacist 07/24/2024 6:31 AM       [1]  Allergies Allergen Reactions   Fish Allergy Nausea And Vomiting   Naproxen Rash

## 2024-07-24 NOTE — ED Provider Notes (Signed)
 " [facility]-EM[facility]GENC[facility] DEPT [name] Emergency Department Provider Note MRN:  [id number]  Arrival date & time: 07/24/24     Chief Complaint   OD History of Present Illness   Anthony Wiggins is a 39 y.o. year-old male with a history of schizophrenia presenting to the ED with chief complaint of OD.  Patient transferred from behavioral health with concern for overdose.  Was not breathing adequately and had small pupils.  Responded to Narcan .  Patient is now having nausea and diffuse abdominal pain.  Review of Systems  A thorough review of systems was obtained and all systems are negative except as noted in the HPI and PMH.   Patient's Health History    Past Medical History:  Diagnosis Date   Dental caries    Schizophrenia (HCC)     No past surgical history on file.  Family History  Problem Relation Age of Onset   Diabetes Mother    Hypertension Mother     Social History   Socioeconomic History   Marital status: Single    Spouse name: Not on file   Number of children: Not on file   Years of education: Not on file   Highest education level: Not on file  Occupational History   Not on file  Tobacco Use   Smoking status: Every Day    Current packs/day: 0.50    Types: Cigarettes   Smokeless tobacco: Never  Substance and Sexual Activity   Alcohol use: Yes    Alcohol/week: 4.0 standard drinks of alcohol    Types: 4 Cans of beer per week   Drug use: Yes    Comment: uses [facility]   Sexual activity: Not on file  Other Topics Concern   Not on file  Social History Narrative   Not on file   Social Drivers of Health   Tobacco Use: High Risk (07/23/2024)   Patient History    Smoking Tobacco Use: Every Day    Smokeless Tobacco Use: Never    Passive Exposure: Not on file  Financial Resource Strain: Not on file  Food Insecurity: No Food Insecurity (07/23/2024)   Epic    Worried About Programme Researcher, Broadcasting/film/video in the Last Year: Never true    Ran Out of Food in the Last Year: Never true   Transportation Needs: No Transportation Needs (07/23/2024)   Epic    Lack of Transportation (Medical): No    Lack of Transportation (Non-Medical): No  Physical Activity: Not on file  Stress: Not on file  Social Connections: Not on file  Intimate Partner Violence: Not At Risk (07/23/2024)   Epic    Fear of Current or Ex-Partner: No    Emotionally Abused: No    Physically Abused: No    Sexually Abused: No  Depression (PHQ2-9): Low Risk (06/10/2022)   Depression (PHQ2-9)    PHQ-2 Score: 4  Alcohol Screen: Not on file  Housing: Low Risk (09/24/2022)   Housing    Last Housing Risk Score: 0  Utilities: Not At Risk (07/23/2024)   Epic    Threatened with loss of utilities: No  Health Literacy: Not on file     Physical Exam   Vitals:   07/24/24 0445 07/24/24 0452  BP:  (!) 168/107  Pulse: 87 90  Resp:  (!) 34  Temp:  (!) 101 F (38.3 C)  SpO2: 100% 100%    CONSTITUTIONAL: Well-appearing, NAD NEURO/PSYCH:  Alert and oriented x 3, no focal deficits EYES:  eyes equal  and reactive ENT/NECK:  no LAD, no JVD CARDIO: Regular rate, well-perfused, normal S1 and S2 PULM:  CTAB no wheezing or rhonchi GI/GU:  non-distended, non-tender MSK/SPINE:  No gross deformities, no edema SKIN:  no rash, atraumatic   *Additional and/or pertinent findings included in MDM below  Diagnostic and Interventional Summary    EKG Interpretation Date/Time:  Friday July 24 2024 01:24:56 EST Ventricular Rate:  94 PR Interval:  166 QRS Duration:  83 QT Interval:  417 QTC Calculation: 519 R Axis:   85  Text Interpretation: Sinus or ectopic atrial rhythm Repol abnrm suggests ischemia, diffuse leads Prolonged QT interval Confirmed by Theadore Sharper [phone]) on 07/24/2024 2:57:42 AM       Labs Reviewed  CBC - Abnormal; Notable for the following components:      Result Value   WBC 15.3 (*)    RBC 5.84 (*)    MCV 77.9 (*)    Platelets 438 (*)    All other components within normal limits   COMPREHENSIVE METABOLIC PANEL WITH GFR - Abnormal; Notable for the following components:   Potassium 3.3 (*)    Chloride 96 (*)    Glucose, Bld 141 (*)    Creatinine, Ser 1.48 (*)    Total Protein 9.2 (*)    Albumin 5.2 (*)    AST 42 (*)    Anion gap 20 (*)    All other components within normal limits  ACETAMINOPHEN  LEVEL - Abnormal; Notable for the following components:   Acetaminophen  (Tylenol ), Serum <10 (*)    All other components within normal limits  SALICYLATE LEVEL - Abnormal; Notable for the following components:   Salicylate Lvl <7.0 (*)    All other components within normal limits  RESP PANEL BY RT-PCR (RSV, FLU A&B, COVID)  RVPGX2  LIPASE, BLOOD  ETHANOL  URINALYSIS, ROUTINE W REFLEX MICROSCOPIC  URINE DRUG SCREEN  T.PALLIDUM AB, TOTAL    CT ABDOMEN PELVIS W CONTRAST  Final Result    DG Chest Port 1 View  Final Result    CT HEAD WO CONTRAST ( )    (Results Pending)  CT CHEST W CONTRAST    (Results Pending)    Medications  naloxone  HCl (NARCAN ) 4 mg in dextrose  5 % 250 mL infusion (has no administration in time range)  LORazepam  (ATIVAN ) injection 1 mg (1 mg Intravenous Given 07/24/24 0234)  sodium chloride  0.9 % bolus 1,000 mL (0 mLs Intravenous Stopped 07/24/24 0508)  iohexol  (OMNIPAQUE ) 350 MG/ML injection 75 mL (75 mLs Intravenous Contrast Given 07/24/24 0207)  naloxone  (NARCAN ) injection 0.4 mg (0.4 mg Intravenous Given 07/24/24 0450)     Procedures  /  Critical Care .Critical Care  Performed by: Theadore Sharper HERO, MD Authorized by: Theadore Sharper HERO, MD   Critical care provider statement:    Critical care time (minutes):  45   Critical care was necessary to treat or prevent imminent or life-threatening deterioration of the following conditions:  Toxidrome   Critical care was time spent personally by me on the following activities:  Development of treatment plan with patient or surrogate, discussions with consultants, evaluation of patient's response to  treatment, examination of patient, ordering and review of laboratory studies, ordering and review of radiographic studies, ordering and performing treatments and interventions, pulse oximetry, re-evaluation of patient's condition and review of old charts   ED Course and Medical Decision Making  Initial Impression and Ddx Differential diagnosis includes opioid toxidrome, foreign body ingestion.  Well-appearing, diffusely tender abdomen.  Past medical/surgical history that increases complexity of ED encounter: Schizophrenia  Interpretation of Diagnostics I personally reviewed the EKG and my interpretation is as follows: Sinus rhythm nonspecific findings  No significant blood count or electrolyte disturbance.  Mild leukocytosis  Patient Reassessment and Ultimate Disposition/Management     Fever up to 101.  Nurse informed me that patient became minimally responsive again, at the same time with very small pupils.  Given 0.4 mg Narcan  with good response, patient now waking easily and answering yes or no questions.  Still suspicious for opioid toxidrome.  I flexed his neck all the way down to his chest and he denies any pain or discomfort, did not seem to bother him at all.  Highly doubt meningitis at this point.  Neuroleptic malignant syndrome is another consideration given his schizophrenia and per chart review potentially chronic use of antipsychotics.  Starting Narcan  drip, case discussed with intensivist who will come to evaluate the patient for admission.  Patient management required discussion with the following services or consulting groups:  Intensivist Service  Complexity of Problems Addressed Acute illness or injury that poses threat of life of bodily function  Additional Data Reviewed and Analyzed Further history obtained from: Recent discharge summary and Prior labs/imaging results  Additional Factors Impacting ED Encounter Risk Consideration of hospitalization  Ozell HERO. Theadore,  MD [facility] Emergency Medicine [facility] [email or web address]  Final Clinical Impressions(s) / ED Diagnoses     ICD-10-CM   1. Altered mental status, unspecified altered mental status type  R41.82       ED Discharge Orders     None        Discharge Instructions Discussed with and Provided to Patient:   Discharge Instructions   None      Theadore Ozell HERO, MD 07/24/24 [phone]  "

## 2024-07-24 NOTE — ED Triage Notes (Signed)
 Pt BIB EMS from [facility]. EMS reports they were called out d/t pt being unresponsive/stuporous. Reports pt had episode of vomiting, pin point pupils, rapid shallow breathing.  Pt reports to EMS that he did not ingest drugs, pt responded to narcan , pupil size increase and mentation improved. Pt guarding abdomen.   EMS admin IV narcan  x2, [place] admin 4mg  zofran  around 8pm.  EMS VS RR 30-40, 160/78, HR 90, 97% RA, cbg 178

## 2024-07-24 NOTE — ED Notes (Signed)
 Patient transported to CT

## 2024-07-24 NOTE — Progress Notes (Signed)
 Psychiatric Nurse Liaison (PNL) Rounding Note   Patient Mood/Affect: Calm/Pleasant  Noted Patient Behaviors: No distress noted.    Interventions Initiated by Psychiatric Nurse Liaison: Emotional support and therapeutic communication provided.   Recommendations for Patient Care: No new recommendations  Patients Response to Treatment: Effective  Time Spent with Patient:  5 min

## 2024-07-24 NOTE — H&P (Signed)
 "  NAME:  Anthony Wiggins, MRN:  [id number], DOB:  1986/03/06, LOS: 0 ADMISSION DATE:  07/24/2024 CONSULTATION DATE:  07/24/2024 REFERRING MD:  Theadore - EDP, CHIEF COMPLAINT:  AMS   History of Present Illness:  39 year old man who presented to [facility] ED 1/30 as a transfer from [facility] for AMS. PMHx significant for schizophrenia, substance-induced psychotic disorder, substance-induced mood disorder, substance abuse (methamphetamines, cocaine, tobacco), dental caries.  Patient initially presented to [facility] 1/28 for insomnia x 3 days. Reportedly noncompliant with medications. Denies SI, HI, AV hallucinations, paranoia. Discharged from UC with recommendation for melatonin versus hydroxyzine  x 1 (did not fill). Patient presented to [facility] ED 1/29 early AM for same, noted to be hypertensive but not in distress, given hydroxyzine  x 1 in ED. Patient presented back to [facility] 1/29, was given Zyprexa  x 1 and hydroxyzine  and was admitted to [facility]. Patient began vomiting and became stuporous and was sent to [facility] ED for medical clearance.  On [facility] ED arrival, patient was reportedly unresponsive/stuporous, vomiting and had pinpoint pupils and rapid shallow breathing. Marginal response to Narcan  with slight improvement in mental status. EMS administered Narcan  x 2, Zofran . On arrival, patient was febrile to 101F, HR 90, BP 168/107, RR 34, SpO2 100%. Labs were notable for WBC 15.3, Hgb 15.5, Plt 438. Na 141, K 3.3, CO2 25, Cr 1.48 (baseline 1.1-1.3), AST 42, LFTs otherwise WNL. Lipase WNL. Ethanol, salicylates, APAP levels negative. UDS from [facility] negative, repeat pending. CT Head with remote cerebellar strokes, no acute findings. CT Chest with GGOs at bases, CT A/P without acute findings. Narcan  gtt initiated in ED.  PCCM consulted for ICU admission.  Pertinent Medical History:   Past Medical History:  Diagnosis Date   Dental caries    Schizophrenia (HCC)    Significant Hospital Events: Including procedures, antibiotic start and stop  dates in addition to other pertinent events   1/30 - Presented to [facility] ED from [facility] fo AMS, fever, vomiting.  Interim History / Subjective:  PCCM consulted for ICU admission.  Objective:  Blood pressure (!) 168/107, pulse 90, temperature (!) 101 F (38.3 C), temperature source Oral, resp. rate (!) 34, height 5' 11 (1.803 m), weight 86.2 kg, SpO2 100%.       No intake or output data in the 24 hours ending 07/24/24 0620 Filed Weights   07/24/24 0128  Weight: 86.2 kg   Physical Examination: General: Acutely ill-appearing man in NAD. Poorly responsive. HEENT: [place]/AT, anicteric sclera, pupils 2mm equal/sluggish, dry mucous membranes. +Dental caries. Neuro: Lethargic. Responds to noxious stimuli. Withdraws to pain in all 4 extremities. ?Clonus of LLE, resolved briefly. Following commands intermittently. Moves all 4 extremities spontaneously.   CV: RRR, no m/g/r. PULM: Breathing variable and mildly labored on RA, at times Cheyne-Stokes-type respirations. Lung fields diminished at bilateral bases. GI: Soft, nontender, nondistended. Normoactive bowel sounds. Extremities: No LE edema noted. Skin: Warm/dry, no rashes.  Resolved Hospital Problem List:    Assessment & Plan:  Acute encephalopathy, toxic versus metabolic versus infectious History of polysubstance abuse History of schizophrenia, noncompliant with medications History of substance-induced psychosis/mood disorder - Admit to ICU for close monitoring - Narcan  gtt, r/o overdose-related component - F/u MRI Brain - Cover broadly for now, r/o meningitis - Obtain LP, f/u studies - F/u Ammonia, trend LA - Correct metabolic derangements - Limit sedating medications as able - Delirium precautions - Spot EEG - Consider formal Neuro consultation  Labs:  CBC: Recent Labs  Lab 07/23/24 0940 07/24/24 0136  WBC 12.2* 15.3*  NEUTROABS 9.5*  --   HGB 15.0 15.5  HCT 44.9 45.5  MCV 78.4* 77.9*  PLT 424* 438*   Basic Metabolic  Panel: Recent Labs  Lab 07/23/24 0940 07/24/24 0136  NA 139 141  K 3.1* 3.3*  CL 96* 96*  CO2 25 25  GLUCOSE 114* 141*  BUN 18 18  CREATININE 1.36* 1.48*  CALCIUM  10.2 10.1   GFR: Estimated Creatinine Clearance: 72.1 mL/min (A) (by C-G formula based on SCr of 1.48 mg/dL (H)). Recent Labs  Lab 07/23/24 0940 07/24/24 0136  WBC 12.2* 15.3*   Liver Function Tests: Recent Labs  Lab 07/23/24 0940 07/24/24 0136  AST 26 42*  ALT 17 20  ALKPHOS 85 81  BILITOT 0.7 0.6  PROT 9.3* 9.2*  ALBUMIN 5.3* 5.2*   Recent Labs  Lab 07/24/24 0136  LIPASE 24   No results for input(s): AMMONIA in the last 168 hours.  ABG:    Component Value Date/Time   TCO2 29 06/10/2013 2124    Coagulation Profile: No results for input(s): INR, PROTIME in the last 168 hours.  Cardiac Enzymes: No results for input(s): CKTOTAL, CKMB, CKMBINDEX, TROPONINI in the last 168 hours.  HbA1C: Hgb A1c MFr Bld  Date/Time Value Ref Range Status  07/23/2024 09:40 AM 6.1 (H) 4.8 - 5.6 % Final    Comment:    (NOTE) Diagnosis of Diabetes The following HbA1c ranges recommended by the American Diabetes Association (ADA) may be used as an aid in the diagnosis of diabetes mellitus.  Hemoglobin             Suggested A1C NGSP%              Diagnosis  <5.7                   Non Diabetic  5.7-6.4                Pre-Diabetic  >6.4                   Diabetic  <7.0                   Glycemic control for                       adults with diabetes.    06/14/2022 11:23 AM 6.2 (H) 4.8 - 5.6 % Final    Comment:    (NOTE)         Prediabetes: 5.7 - 6.4         Diabetes: >6.4         Glycemic control for adults with diabetes: <7.0    CBG: Recent Labs  Lab 07/24/24 0024  GLUCAP 142*   Review of Systems:   Patient is encephalopathic and/or intubated; therefore, history has been obtained from chart review.   Past Medical History:  He,  has a past medical history of Dental caries and  Schizophrenia (HCC).   Surgical History:  No past surgical history on file.   Social History:   reports that he has been smoking cigarettes. He has never used smokeless tobacco. He reports current alcohol use of about 4.0 standard drinks of alcohol per week. He reports current drug use.   Family History:  His family history includes Diabetes in his mother; Hypertension in his mother.   Allergies: Allergies[1]   Home Medications: Prior to Admission medications  Medication Sig Start Date End  Date Taking? Authorizing Provider  hydrOXYzine  (ATARAX ) 25 MG tablet Take 1 tablet (25 mg total) by mouth every 6 (six) hours. Patient not taking: Reported on 07/23/2024 07/23/24   Ruthell Lonni FALCON, PA-C  OLANZapine  (ZYPREXA ) 15 MG tablet Take 1 tablet (15 mg total) by mouth daily with supper. Patient not taking: Reported on 07/23/2024 06/20/22   Rankin, Luisa B, NP   Critical care time:   The patient is critically ill with multiple organ system failure and requires high complexity decision making for assessment and support, frequent evaluation and titration of therapies, advanced monitoring, review of radiographic studies and interpretation of complex data.   Critical Care Time devoted to patient care services, exclusive of separately billable procedures, described in this note is 35 minutes.  Corean CHRISTELLA Kensie Susman, PA-C [place] 07/24/24 6:20 AM  Please see [email or web address] for pager details.  From 7A-7P if no response, please call [phone] After hours, please call ELink [phone]    [1]  Allergies Allergen Reactions   Fish Allergy Nausea And Vomiting   Naproxen Rash   "

## 2024-07-25 ENCOUNTER — Inpatient Hospital Stay (HOSPITAL_COMMUNITY): Payer: MEDICAID

## 2024-07-25 DIAGNOSIS — I10 Essential (primary) hypertension: Secondary | ICD-10-CM | POA: Diagnosis not present

## 2024-07-25 DIAGNOSIS — G929 Unspecified toxic encephalopathy: Secondary | ICD-10-CM | POA: Diagnosis not present

## 2024-07-25 DIAGNOSIS — F209 Schizophrenia, unspecified: Secondary | ICD-10-CM | POA: Diagnosis not present

## 2024-07-25 DIAGNOSIS — F1910 Other psychoactive substance abuse, uncomplicated: Secondary | ICD-10-CM

## 2024-07-25 DIAGNOSIS — E785 Hyperlipidemia, unspecified: Secondary | ICD-10-CM | POA: Diagnosis not present

## 2024-07-25 DIAGNOSIS — N179 Acute kidney failure, unspecified: Secondary | ICD-10-CM | POA: Diagnosis not present

## 2024-07-25 LAB — CBC
HCT: 46.9 % (ref 39.0–52.0)
Hemoglobin: 14.9 g/dL (ref 13.0–17.0)
MCH: 26.3 pg (ref 26.0–34.0)
MCHC: 31.8 g/dL (ref 30.0–36.0)
MCV: 82.7 fL (ref 80.0–100.0)
Platelets: 354 10*3/uL (ref 150–400)
RBC: 5.67 MIL/uL (ref 4.22–5.81)
RDW: 13.9 % (ref 11.5–15.5)
WBC: 14.2 10*3/uL — ABNORMAL HIGH (ref 4.0–10.5)
nRBC: 0 % (ref 0.0–0.2)

## 2024-07-25 LAB — BASIC METABOLIC PANEL WITH GFR
Anion gap: 16 — ABNORMAL HIGH (ref 5–15)
BUN: 15 mg/dL (ref 6–20)
CO2: 22 mmol/L (ref 22–32)
Calcium: 8.7 mg/dL — ABNORMAL LOW (ref 8.9–10.3)
Chloride: 103 mmol/L (ref 98–111)
Creatinine, Ser: 1.22 mg/dL (ref 0.61–1.24)
GFR, Estimated: >60 mL/min
Glucose, Bld: 120 mg/dL — ABNORMAL HIGH (ref 70–99)
Potassium: 3.8 mmol/L (ref 3.5–5.1)
Sodium: 141 mmol/L (ref 135–145)

## 2024-07-25 LAB — CK: Total CK: 770 U/L — ABNORMAL HIGH (ref 49–397)

## 2024-07-25 MED ORDER — AMLODIPINE BESYLATE 10 MG PO TABS
10.0000 mg | ORAL_TABLET | Freq: Every day | ORAL | Status: DC
  Administered 2024-07-25 – 2024-07-27 (×3): 10 mg via ORAL
  Filled 2024-07-25 (×3): qty 1

## 2024-07-25 MED ORDER — ASPIRIN 81 MG PO TBEC
81.0000 mg | DELAYED_RELEASE_TABLET | Freq: Every day | ORAL | Status: DC
  Administered 2024-07-25 – 2024-07-27 (×3): 81 mg via ORAL
  Filled 2024-07-25 (×3): qty 1

## 2024-07-25 MED ORDER — NICOTINE 21 MG/24HR TD PT24
21.0000 mg | MEDICATED_PATCH | Freq: Every day | TRANSDERMAL | Status: DC
  Administered 2024-07-25 – 2024-07-26 (×2): 21 mg via TRANSDERMAL
  Filled 2024-07-25 (×3): qty 1

## 2024-07-25 MED ORDER — CLEVIDIPINE BUTYRATE 0.5 MG/ML IV EMUL
0.0000 mg/h | INTRAVENOUS | Status: DC
  Filled 2024-07-25: qty 100

## 2024-07-25 MED ORDER — ATORVASTATIN CALCIUM 40 MG PO TABS
40.0000 mg | ORAL_TABLET | Freq: Every day | ORAL | Status: DC
  Administered 2024-07-25 – 2024-07-27 (×3): 40 mg via ORAL
  Filled 2024-07-25 (×3): qty 1

## 2024-07-25 MED ORDER — CLONIDINE HCL 0.1 MG PO TABS
0.1000 mg | ORAL_TABLET | Freq: Two times a day (BID) | ORAL | Status: DC
  Administered 2024-07-25 – 2024-07-26 (×2): 0.1 mg via ORAL
  Filled 2024-07-25 (×2): qty 1

## 2024-07-25 MED ORDER — FENTANYL CITRATE (PF) 50 MCG/ML IJ SOSY: 50.0000 ug | PREFILLED_SYRINGE | INTRAMUSCULAR | Status: DC | PRN

## 2024-07-25 NOTE — Plan of Care (Signed)

## 2024-07-25 NOTE — Progress Notes (Signed)
 "  NAME:  Anthony Wiggins, MRN:  [id number], DOB:  [birth year], LOS: 1 ADMISSION DATE:  07/24/2024 CONSULTATION DATE:  07/24/2024 REFERRING MD:  Theadore - EDP, CHIEF COMPLAINT:  AMS   History of Present Illness:  39 year old man who presented to [facility] ED 1/30 as a transfer from [facility] for AMS. PMHx significant for schizophrenia, substance-induced psychotic disorder, substance-induced mood disorder, substance abuse (methamphetamines, cocaine, tobacco), dental caries.  Patient initially presented to [facility] 1/28 for insomnia x 3 days. Reportedly noncompliant with medications. Denies SI, HI, AV hallucinations, paranoia. Discharged from UC with recommendation for melatonin versus hydroxyzine  x 1 (did not fill). Patient presented to [facility] ED 1/29 early AM for same, noted to be hypertensive but not in distress, given hydroxyzine  x 1 in ED. Patient presented back to [facility] 1/29, was given Zyprexa  x 1 and hydroxyzine  and ativan  and was admitted to [facility]. Patient began vomiting and became stuporous and was sent to [facility] ED for medical clearance.  On [facility] ED arrival, patient was reportedly unresponsive/stuporous, vomiting and had pinpoint pupils and rapid shallow breathing. Marginal response to Narcan  with slight improvement in mental status. EMS administered Narcan  x 2, Zofran . On arrival, patient was febrile to 101F, HR 90, BP 168/107, RR 34, SpO2 100%. Labs were notable for WBC 15.3, Hgb 15.5, Plt 438. Na 141, K 3.3, CO2 25, Cr 1.48 (baseline 1.1-1.3), AST 42, LFTs otherwise WNL. Lipase WNL. Ethanol, salicylates, APAP levels negative. UDS from [facility] negative, repeat pending. CT Head with remote cerebellar strokes, no acute findings. CT Chest with GGOs at bases, CT A/P without acute findings. Narcan  gtt initiated in ED.  PCCM consulted for ICU admission.  Pertinent Medical History:   Past Medical History:  Diagnosis Date   Dental caries    Schizophrenia (HCC)    Significant Hospital Events: Including procedures, antibiotic start  and stop dates in addition to other pertinent events   1/30 - Presented to [facility] ED from [facility] fo AMS, fever, vomiting.  Interim History / Subjective:  Patient was agitated overnight, currently calm and cooperative Remained afebrile   Objective:  Blood pressure (!) 172/99, pulse 95, temperature 99.1 F (37.3 C), temperature source Oral, resp. rate (!) 25, height 5' 11 (1.803 m), weight 76.2 kg, SpO2 96%.        Intake/Output Summary (Last 24 hours) at 07/25/2024 0859 Last data filed at 07/25/2024 0800 Gross per 24 hour  Intake 1951.23 ml  Output 500 ml  Net 1451.23 ml   Filed Weights   07/24/24 0128 07/24/24 0657 07/25/24 0500  Weight: 86.2 kg 74.5 kg 76.2 kg   Physical Examination: General: Young male, lying on the bed HEENT: [place]/AT, eyes anicteric.  moist mucus membranes Neuro: Awake, following commands, antigravity in all 4 extremities Chest: Coarse breath sounds, no wheezes or rhonchi Heart: Regular rate and rhythm, no murmurs or gallops Abdomen: Soft, nontender, nondistended, bowel sounds present  Labs and images reviewed  Patient Lines/Drains/Airways Status     Active Line/Drains/Airways     Name Placement date Placement time Site Days   Peripheral IV 07/24/24 18 G Left Antecubital 07/24/24  0131  Antecubital  1   Peripheral IV 07/24/24 22 G Right Antecubital 07/24/24  0554  Antecubital  1   Peripheral IV 07/24/24 22 G Posterior;Right Hand 07/24/24  0600  Hand  1             Resolved Hospital Problem List:    Assessment & Plan:  Acute toxic encephalopathy in the setting  of fentanyl  use Polysubstance abuse with fentanyl  and methamphetamine Schizophrenia, noncompliant with treatment Bilateral cerebellar stroke subacute/chronic Probable aspiration pneumonia Acute kidney injury due to dehydration Hypokalemia, resolved Uncontrolled hypertension Hyperlipidemia  Patient mental status has improved He is off Narcan  infusion UTOX is positive for fentanyl  and  benzodiazepine He admitted intermittently using methamphetamine Watch for signs of withdrawal Restarted back on olanzapine  Psychiatry has seen the patient, recommend voluntary inpatient psychiatric admission Started on aspirin  and statins His LDL is 185 Will get MRI brain to assess chronicity of bilateral cerebellar strokes If he has acute strokes, will need neurology consult Continue IV ceftriaxone  to complete 5-day course of therapy Electrolytes are being corrected Patient blood pressure is elevated, started on amlodipine  10 mg daily   Pulm nodule Needs outpatient follow-up   Labs:  CBC: Recent Labs  Lab 07/23/24 0940 07/24/24 0136 07/25/24 0357  WBC 12.2* 15.3* 14.2*  NEUTROABS 9.5*  --   --   HGB 15.0 15.5 14.9  HCT 44.9 45.5 46.9  MCV 78.4* 77.9* 82.7  PLT 424* 438* 354   Basic Metabolic Panel: Recent Labs  Lab 07/23/24 0940 07/24/24 0136 07/24/24 1005 07/25/24 0357  NA 139 141  --  141  K 3.1* 3.3*  --  3.8  CL 96* 96*  --  103  CO2 25 25  --  22  GLUCOSE 114* 141*  --  120*  BUN 18 18  --  15  CREATININE 1.36* 1.48*  --  1.22  CALCIUM  10.2 10.1  --  8.7*  MG  --   --  2.4  --    GFR: Estimated Creatinine Clearance: 87.4 mL/min (by C-G formula based on SCr of 1.22 mg/dL). Recent Labs  Lab 07/23/24 0940 07/24/24 0136 07/24/24 0645 07/25/24 0357  WBC 12.2* 15.3*  --  14.2*  LATICACIDVEN  --   --  1.2  --    Liver Function Tests: Recent Labs  Lab 07/23/24 0940 07/24/24 0136  AST 26 42*  ALT 17 20  ALKPHOS 85 81  BILITOT 0.7 0.6  PROT 9.3* 9.2*  ALBUMIN 5.3* 5.2*   Recent Labs  Lab 07/24/24 0136  LIPASE 24   Recent Labs  Lab 07/24/24 0614  AMMONIA <13    ABG:    Component Value Date/Time   TCO2 29 06/10/2013 2124    Coagulation Profile: Recent Labs  Lab 07/24/24 0614  INR 1.1    Cardiac Enzymes: Recent Labs  Lab 07/24/24 1005 07/25/24 0357  CKTOTAL 1,372* 770*    HbA1C: Hgb A1c MFr Bld  Date/Time Value Ref  Range Status  07/23/2024 09:40 AM 6.1 (H) 4.8 - 5.6 % Final    Comment:    (NOTE) Diagnosis of Diabetes The following HbA1c ranges recommended by the American Diabetes Association (ADA) may be used as an aid in the diagnosis of diabetes mellitus.  Hemoglobin             Suggested A1C NGSP%              Diagnosis  <5.7                   Non Diabetic  5.7-6.4                Pre-Diabetic  >6.4                   Diabetic  <7.0  Glycemic control for                       adults with diabetes.    06/14/2022 11:23 AM 6.2 (H) 4.8 - 5.6 % Final    Comment:    (NOTE)         Prediabetes: 5.7 - 6.4         Diabetes: >6.4         Glycemic control for adults with diabetes: <7.0    CBG: Recent Labs  Lab 07/24/24 0024  GLUCAP 142*      Valinda Novas, MD [facility] [facility] See Amion for pager If no response to pager, please call [phone] until 7pm After 7pm, Please call E-link [phone]   "

## 2024-07-25 NOTE — Consult Note (Signed)
 Man Psychiatric Consult Follow Up  Patient Name: .Anthony Wiggins  MRN: [id number]  DOB: 30-Aug-1985  Consult Order details:  Orders (From admission, onward)     Start     Ordered   07/24/24 1137  IP CONSULT TO PSYCHIATRY       Comments: Admitted to us  from [facility] team, sounds like plan from psych NP note is dc to [facility] when medically cleared -- just wanted to make sure he doesn't fall off of psych's radar.  Ordering Provider: Daren Ronnald BRAVO, NP  Provider:  (Not yet assigned)  Question Answer Comment  Location [facility]   Reason for Consult? Recent psychotic features + substance abuse      07/24/24 1137             Mode of Visit: In person    Psychiatry Consult Evaluation  Service Date: July 25, 2024 LOS:  LOS: 1 day  Chief Complaint I wasn't getting any sleep until last night  Primary Psychiatric Diagnoses  Fentanyl  use disorder, moderate, dependence   Assessment  Anthony Wiggins is a 39 y.o. male admitted: Medicallyfor 07/24/2024  1:19 AM for AMS. He carries the psychiatric diagnoses of polysubstance use d/o and has no past medical history.  His current presentation of withdraw symptoms from fentanyl  with body and joint aches, nausea, daily use nasally is most consistent with fentanyl  use d/o.  Current outpatient psychotropic medications include none.  On initial examination, patient was curled up on his bed and denied depression, anxiety, hallucinations, paranoia, suicidal/homicidal ideations.  He did report using fentanyl  daily via sniffing and plans to not return to use, encouraged rehab a few times, he declined.  Not responding to internal stimuli on assessment. Please see plan below for detailed recommendations.   Diagnoses:  Active Hospital problems: Principal Problem:   Altered mental status Active Problems:   Psychoactive substance-induced psychosis (HCC)   Fentanyl  use disorder, moderate, dependence (HCC)    Plan   ## Psychiatric  Medication Recommendations:  Recommended substance abuse rehab, he declined along with MAT Continue Zyprexa  5 mg daily at bedtime  ## Medical Decision Making Capacity: Not specifically addressed in this encounter  ## Further Work-up:  -- most recent EKG on 07/24/2024 had QtC of 519 -- Pertinent labwork reviewed earlier this admission includes: toxicology positive for benzodiazepines and fentanyl , CBC, chem panel, elevated CK level   ## Disposition:-- Discharge home when medically stable  ## Behavioral / Environmental: -Delirium Precautions: Delirium Interventions for Nursing and Staff: - RN to open blinds every AM. - To Bedside: Glasses, hearing aide, and pt's own shoes. Make available to patients. when possible and encourage use. - Encourage po fluids when appropriate, keep fluids within reach. - OOB to chair with meals. - Passive ROM exercises to all extremities with AM & PM care. - RN to assess orientation to person, time and place QAM and PRN. - Recommend extended visitation hours with familiar family/friends as feasible. - Staff to minimize disturbances at night. Turn off television when pt asleep or when not in use.    ## Safety and Observation Level:  - Based on my clinical evaluation, I estimate the patient to be at low risk of self harm in the current setting. - At this time, we recommend  routine. This decision is based on my review of the chart including patient's history and current presentation, interview of the patient, mental status examination, and consideration of suicide risk including evaluating suicidal ideation, plan,  intent, suicidal or self-harm behaviors, risk factors, and protective factors. This judgment is based on our ability to directly address suicide risk, implement suicide prevention strategies, and develop a safety plan while the patient is in the clinical setting. Please contact our team if there is a concern that risk level has changed.  CSSR Risk Category:C-SSRS  RISK CATEGORY: No Risk  Suicide Risk Assessment: Patient has following modifiable risk factors for suicide: recent substance abuse induced psychosis, which we are addressing by recommending substance abuse rehab, he declined Patient has following non-modifiable or demographic risk factors for suicide: male gender and psychiatric hospitalization Patient has the following protective factors against suicide: Supportive family and no history of suicide attempts  Thank you for this consult request. Recommendations have been communicated to the primary team.  We will sign off at this time.   Sharlot Becker, NP       History of Present Illness  Relevant Aspects of [facility] Course:  Admitted on 07/24/2024 for AMS.   Patient Report:  The client was sent to the ED from the [facility] due to having vomiting and AMS from fentanyl  abuse.  Today, he reported he feels better and was able to sleep last night.  He had not be able to sleep since using meth a few days ago until last night.  Denied depression, anxiety, suicidal ideations,hallucinations, and paranoia.  He did report having one panic attack two days ago and went to the ED for sleep medication, hydroxyzine  given with no relief at that time.  Appetite is getting back there.  He is experiencing body and joint aches along with an upset stomach since not using fentanyl  that he sniffs daily.  Encouraged rehab a few times to which he declined.  It appears he is slated to go to [facility] when he is medically cleared.    07/25/24; Patient continued to deny depression, I'm not really depressed.  No suicidal ideations or homicidal ideations, anxiety, or withdrawal symptoms---body aches or nausea today.  His girlfriend, Anthony Wiggins, stayed the night with him and he proved permission and her phone number for collateral since they live together with their two children.  Anthony Wiggins said I do not feel he is unsafe and feel fine with him coming home, verified no  weapons in the home.  She reported he gets this way when he cannot sleep and will follow up with the outpatient provider he has seen in the past.  Anthony Wiggins reported his sleep is good with the Zyprexa , no side effects.  Encouraged substance abuse rehab or MAT, he declined.  His girlfriend did not feel this was an issue, r/t rehab.  Psych ROS:  Depression: denied Anxiety:  denied Mania (lifetime and current): denied Psychosis: (lifetime and current): none at this time  Per admission to the [facility] note by Dr. Prentice Espy on 07/23/24: Anthony Wiggins is a 39 y.o. male  with a past psychiatric history of methamphetamine use disorder, substance induced psychotic disorder, substance induced mood disorder presents to [facility] on 07/23/2024 voluntarily accompanied by Anthony due to insomnia. He presented to [facility] yesterday evening and then [facility] for similar complaints and was discharged with hydroxyzine .    Patient reports having problems with sleep for past 2 days although Anthony reports it has been 3-4 days. He provides limited history due to somnolence and poor inattention. 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 He would not disclose how much methamphetamine he uses or how frequently. He states he just needs some medications to help him sleep. He feels tired at this time but feels he cannot fall asleep. He denies psychotic symptoms at this time although will have pauses in speech.   Collateral Anthony Wiggins) was spoken to in person She reported patient's girlfriend whom patient lives with called Anthony yesterday due to patient acting erratically and not sleeping for at least a couple of days. Anthony suspects either significant substance use or mental health problem. Anthony took patient to [facility] and ED and had him stay with her overnight. He took one time dose of hydroxyzine  early this morning but it was ineffective for his sleep and  they were unable to pick up prescription as of yet given pharmacies were still closed. He was seeing Dr. Delynn for psychiatric needs up until a year ago when he decided he no longer needed to be seen or take medications. At that point, he appeared to have been doing well and significantly more stable at that point in time than currently. She reports she keeps in touch with him about 1-2 times per month. She did not note any negative symptoms, delusions, or responding to internal stimuli at that time. She states overnight, he was pacing around the home, responding to internal stimuli, and threatening Anthony although he did not appear to know who Anthony was at that time. She did eventually get him to agree to return to [facility] to be evaluated.  Review of Systems  Constitutional: Negative.   HENT: Negative.    Eyes: Negative.   Respiratory: Negative.    Cardiovascular: Negative.   Gastrointestinal: Negative.   Genitourinary: Negative.   Musculoskeletal:  Positive for joint pain.  Skin: Negative.   Neurological: Negative.   Endo/Heme/Allergies: Negative.   Psychiatric/Behavioral:  Positive for substance abuse.   All other systems reviewed and are negative.    Psychiatric and Social History  Psychiatric History:  Information collected from patient and chart  Prev Dx/Sx: meth and fentanyl  abuse/dependence Current Psych Provider: none Home Meds (current): none Previous Med Trials: zyprexa  Therapy: none currently  Prior Psych Hospitalization: [facility]  Prior Self Harm: none Prior Violence: at times when under the influence of substances  Social History:  Living Situation: lives with the mother of his 62 and 26 yo children, works as a Designer, Industrial/product to weapons/lethal means: denied   Substance History Fentanyl  daily via sniffing  Exam Findings  Physical Exam: completed by MD, reviewed. Vital Signs:  Temp:  [98.1 F (36.7 C)-99.7 F (37.6 C)] 99.1 F (37.3 C) (01/31 0749) Pulse Rate:  [79-119]  97 (01/31 0930) Resp:  [16-43] 20 (01/31 0930) BP: (138-196)/(88-134) 183/108 (01/31 0930) SpO2:  [85 %-99 %] 97 % (01/31 0930) Weight:  [76.2 kg] 76.2 kg (01/31 0500) Blood pressure (!) 183/108, pulse 97, temperature 99.1 F (37.3 C), temperature source Oral, resp. rate 20, height 5' 11 (1.803 m), weight 76.2 kg, SpO2 97%. Body mass index is 23.43 kg/m.  Physical Exam  Mental Status Exam: General Appearance: Casual  Orientation:  Full (Time, Place, and Person)  Memory:  Immediate;   Good Recent;   Good Remote;   Good  Concentration:  Concentration: Good and Attention Span: Good  Recall:  Good  Attention  Good  Eye Contact:  Fair  Speech:  Clear and Coherent  Language:  Good  Volume:  Normal  Mood: denied depression and anxiety  Affect:  Congruent  Thought Process:  Coherent  Thought Content:  Logical  Suicidal Thoughts:  No  Homicidal Thoughts:  No  Judgement:  Fair  Insight:  Fair  Psychomotor Activity:  Decreased  Akathisia:  No  Fund of Knowledge:  Fair      Assets:  Housing Leisure Time Physical Health Resilience Social Support  Cognition:  WNL  ADL's:  Intact  AIMS (if indicated):        Other History   These have been pulled in through the EMR, reviewed, and updated if appropriate.  Family History:  The patient's family history includes Diabetes in his mother; Hypertension in his mother.  Medical History: Past Medical History:  Diagnosis Date   Dental caries    Schizophrenia [facility])     Surgical History: No past surgical history on file.   Medications:  Current Medications[1]  Allergies: Allergies[2]  Sharlot Becker, NP      [1]  Current Facility-Administered Medications:    amLODipine  (NORVASC ) tablet 10 mg, 10 mg, Oral, Daily, Chand, Sudham, MD, 10 mg at 07/25/24 0940   aspirin  EC tablet 81 mg, 81 mg, Oral, Daily, Harold Scholz, MD, 81 mg at 07/25/24 0940   atorvastatin  (LIPITOR) tablet 40 mg, 40 mg, Oral, Daily, Harold Scholz, MD,  40 mg at 07/25/24 1106   cefTRIAXone  (ROCEPHIN ) 2 g in sodium chloride  0.9 % 100 mL IVPB, 2 g, Intravenous, Q24H, Harold Scholz, MD, Last Rate: 200 mL/hr at 07/25/24 1103, 2 g at 07/25/24 1103   Chlorhexidine  Gluconate Cloth 2 % PADS 6 each, 6 each, Topical, Daily, Sherrell Golas, MD, 6 each at 07/25/24 0941   enoxaparin  (LOVENOX ) injection 40 mg, 40 mg, Subcutaneous, Q24H, Bowser, Grace E, NP, 40 mg at 07/25/24 0941   folic acid  (FOLVITE ) tablet 1 mg, 1 mg, Oral, Daily, Bowser, Grace E, NP, 1 mg at 07/25/24 1101   hydrALAZINE  (APRESOLINE ) injection 10 mg, 10 mg, Intravenous, Q4H PRN, Bowser, Grace E, NP, 10 mg at 07/25/24 9362   multivitamin with minerals tablet 1 tablet, 1 tablet, Oral, Daily, Bowser, Grace E, NP, 1 tablet at 07/25/24 1101   nicotine  (NICODERM CQ  - dosed in mg/24 hours) patch 21 mg, 21 mg, Transdermal, Daily, Claudene Toribio BROCKS, MD, 21 mg at 07/25/24 9472   OLANZapine  (ZYPREXA ) tablet 5 mg, 5 mg, Oral, QHS, Bowser, Grace E, NP, 5 mg at 07/24/24 2138   ondansetron  (ZOFRAN ) injection 4 mg, 4 mg, Intravenous, Q6H PRN, Bowser, Grace E, NP, 4 mg at 07/25/24 0243   Oral care mouth rinse, 15 mL, Mouth Rinse, PRN, Sherrell Golas, MD   polyethylene glycol (MIRALAX  / GLYCOLAX ) packet 17 g, 17 g, Oral, Daily PRN, Sherrell Golas, MD   senna [facility]) tablet 8.6 mg, 1 tablet, Oral, BID PRN, Sherrell Golas, MD   thiamine  (VITAMIN B1) 400 mg in sodium chloride  0.9 % 50 mL IVPB, 400 mg, Intravenous, Q8H, Stopped at 07/25/24 0557 **FOLLOWED BY** [START ON 07/26/2024] thiamine  (VITAMIN B1) 200 mg in sodium chloride  0.9 % 50 mL IVPB, 200 mg, Intravenous, Q8H **FOLLOWED BY** [START ON 07/28/2024] thiamine  (VITAMIN B1) 200 mg in sodium chloride  0.9 % 50 mL IVPB, 200 mg, Intravenous, Q24H, Bowser, Ronnald BRAVO, NP [2]  Allergies Allergen Reactions   Fish Allergy Nausea And Vomiting   Naproxen Rash

## 2024-07-25 NOTE — Progress Notes (Signed)
"   cefTRIAXone  (ROCEPHIN )  IV Stopped (07/24/24 0946)   lactated ringers  100 mL/hr at 07/25/24 0020   naloxone  HCl (NARCAN ) 4 mg in dextrose  5 % 250 mL infusion Stopped (07/24/24 1609)   thiamine  (VITAMIN B1) injection Stopped (07/24/24 2341)   Followed by   NOREEN ON 07/26/2024] thiamine  (VITAMIN B1) injection     Followed by   NOREEN ON 07/28/2024] thiamine  (VITAMIN B1) injection      Off naloxone  drip, some violent outbursts On camera he is pretty passed out EtOH use 4/week Not on naloxone  gtt  I'll put in a nicotine  patch Can't use haldol  with Qtc Would try to just redirect for now, not really c/w alcohol w/d nor fent w/d at present; should it be persistent can consider low dose fent IV  Rolan Sharps MD PCCM "

## 2024-07-26 DIAGNOSIS — F19959 Other psychoactive substance use, unspecified with psychoactive substance-induced psychotic disorder, unspecified: Secondary | ICD-10-CM | POA: Diagnosis not present

## 2024-07-26 DIAGNOSIS — F1120 Opioid dependence, uncomplicated: Secondary | ICD-10-CM

## 2024-07-26 DIAGNOSIS — R400 Somnolence: Secondary | ICD-10-CM | POA: Diagnosis not present

## 2024-07-26 MED ORDER — CARVEDILOL 6.25 MG PO TABS
6.2500 mg | ORAL_TABLET | Freq: Two times a day (BID) | ORAL | Status: DC
  Administered 2024-07-26 (×2): 6.25 mg via ORAL
  Filled 2024-07-26 (×2): qty 1

## 2024-07-26 MED ORDER — HYDRALAZINE HCL 25 MG PO TABS: 25.0000 mg | ORAL_TABLET | Freq: Four times a day (QID) | ORAL | Status: DC | PRN

## 2024-07-26 MED ORDER — CARVEDILOL 12.5 MG PO TABS: 12.5000 mg | ORAL_TABLET | Freq: Two times a day (BID) | ORAL | Status: DC

## 2024-07-26 MED ORDER — LABETALOL HCL 5 MG/ML IV SOLN: 10.0000 mg | INTRAVENOUS | Status: DC | PRN

## 2024-07-26 NOTE — Progress Notes (Signed)
 " PROGRESS NOTE  Anthony Wiggins FMW:[id number] DOB: 21-Sep-1985   PCP: Patient, No Pcp Per  Patient is from: Home.  DOA: 07/24/2024 LOS: 2  Chief complaints No chief complaint on file.    Brief Narrative / Interim history: 39 year old M with PMH of schizophrenia, substance-induced psychosis and mood disorder and dental caries transferred to [facility] ED from [facility] on 1/30 due to altered mental status and unresponsiveness, and admitted to ICU due to encephalopathy in the setting of fentanyl  overdose.    Patient initially presented to [facility] 1/28 for insomnia x 3 days. Reportedly noncompliant with medications. Denies SI, HI, AV hallucinations, paranoia. Discharged from UC with recommendation for melatonin versus hydroxyzine  x 1 (did not fill). Patient presented to [facility] ED 1/29 early AM for same, noted to be hypertensive but not in distress, given hydroxyzine  x 1 in ED. Patient presented back to [facility] 1/29, was given Zyprexa  x 1 and hydroxyzine  and ativan  and was admitted to [facility]. Patient began vomiting and became stuporous and was sent to [facility] ED for medical clearance.   In ED, reportedly unresponsive/stuporous, vomiting and had pinpoint pupils and rapid shallow breathing. Marginal response to Narcan  with slight improvement in mental status. EMS administered Narcan  x 2, Zofran .  He was also febrile to 101 F.  BP elevated to 168/107.  Tachypneic to 34.  100% on RA. Labs were notable for WBC 15.3, Hgb 15.5, Plt 438. Na 141, K 3.3, CO2 25, Cr 1.48 (baseline 1.1-1.3), AST 42, LFTs otherwise WNL. Lipase WNL. Ethanol, salicylates, APAP levels negative. UDS from [facility] negative, repeat pending. CT Head with remote cerebellar strokes, no acute findings. CT Chest with GGOs at bases, CT A/P without acute findings.  Started on ceftriaxone  for possible aspiration pneumonia.  Narcan  gtt initiated in ED. patient was admitted to ICU.  While in ICU, encephalopathy resolved and he was weaned off Narcan  drip.  Evaluated by psychiatry  who recommended continuing Zyprexa  and cleared patient for discharge once medically stable.  Patient was transferred to hospitalist service on 07/26/2024.    Subjective: Seen and examined earlier this morning.  No major events overnight or this morning.  No complaints.  Denies chest pain, shortness of breath, cough, GI or UTI symptoms.  Denies depressed mood, SI, HI, hallucination.  Eager to go home.  Oriented x 4.   Assessment and plan: Acute toxic encephalopathy in the setting of fentanyl  use: Resolved. History of schizophrenia, noncompliant with treatment-currently without psychosis Polysubstance abuse with fentanyl  and methamphetamine: Admits to using meth and fentanyl .  UDS positive for fentanyl  and benzo. -Required Narcan  drip in ICU. -Counseled on the importance of cessation. -Psychiatry recs: Continue Zyprexa  at bedtime and cleared patient for discharge home once medically stable  Chronic bilateral cerebellar stroke: Noted on CT head and MRI brain.  No focal neurodeficit on exam. - Started on aspirin  and Lipitor.  Will continue.  Probable aspiration pneumonia-had episode of emesis when he was unresponsive.  No respiratory symptoms - Continue ceftriaxone  to complete 5 days course on 2/4.  Acute kidney injury due to dehydration-resolving. Recent Labs    07/23/24 0940 07/24/24 0136 07/25/24 0357  BUN 18 18 15   CREATININE 1.36* 1.48* 1.22  - Avoid nephrotoxins. - Recheck in the morning  Uncontrolled hypertension: BP remains elevated.  Not on meds at home. - Change clonidine  to Coreg  - Continue amlodipine  10 mg daily - P.o. hydralazine  as needed - IV labetalol  as needed  Rhabdomyolysis: Likely due to polysubstance use.  Resolving.  Hyperlipidemia -  Continue statin as above  Hypokalemia, resolved   Dental caries -Needs outpatient follow-up with dentist  Body mass index is 23.43 kg/m.           DVT prophylaxis:  enoxaparin  (LOVENOX ) injection 40 mg Start:  07/24/24 1000 SCDs Start: 07/24/24 0612  Code Status: Full code Family Communication: None at bedside Level of care: Med-Surg Status is: Inpatient Remains inpatient appropriate because: Encephalopathy, uncontrolled hypertension   Final disposition: Home once medically stable   55 minutes with more than 50% spent in reviewing records, counseling patient/family and coordinating care.  Consultants:  Critical care admitted patient Psychiatry  Procedures: None  Microbiology summarized: COVID-19, influenza and RSV PCR nonreactive Blood cultures NGTD Strep PCR screen nonreactive  Objective: Vitals:   07/25/24 2033 07/26/24 0113 07/26/24 0607 07/26/24 0900  BP: (!) 152/96 (!) 149/98 (!) 166/102 (!) 161/103  Pulse: 95 (!) 108 89 90  Resp: 14  16 16   Temp: 97.9 F (36.6 C)  98.8 F (37.1 C) 99.5 F (37.5 C)  TempSrc:    Oral  SpO2: 98%  98% 99%  Weight:      Height:        Examination:  GENERAL: No apparent distress.  Nontoxic. HEENT: MMM.  Poor dentition  Vision and hearing grossly intact.  NECK: Supple.  No apparent JVD.  RESP:  No IWOB.  Fair aeration bilaterally. CVS:  RRR. Heart sounds normal.  ABD/GI/GU: BS+. Abd soft, NTND.  MSK/EXT:  Moves extremities. No apparent deformity. No edema.  SKIN: no apparent skin lesion or wound NEURO: AA.  Oriented 4..  No apparent focal neuro deficit. PSYCH: Calm. Normal affect.   Sch Meds:  Scheduled Meds:  amLODipine   10 mg Oral Daily   aspirin  EC  81 mg Oral Daily   atorvastatin   40 mg Oral Daily   carvedilol   6.25 mg Oral BID WC   Chlorhexidine  Gluconate Cloth  6 each Topical Daily   enoxaparin  (LOVENOX ) injection  40 mg Subcutaneous Q24H   folic acid   1 mg Oral Daily   multivitamin with minerals  1 tablet Oral Daily   nicotine   21 mg Transdermal Daily   OLANZapine   5 mg Oral QHS   Continuous Infusions:  cefTRIAXone  (ROCEPHIN )  IV 2 g (07/26/24 0950)   thiamine  (VITAMIN B1) injection     Followed by   NOREEN ON  07/28/2024] thiamine  (VITAMIN B1) injection     PRN Meds:.hydrALAZINE , labetalol , ondansetron  (ZOFRAN ) IV, mouth rinse, polyethylene glycol, senna  Antimicrobials: Anti-infectives (From admission, onward)    Start     Dose/Rate Route Frequency Ordered Stop   07/24/24 2000  vancomycin  (VANCOCIN ) IVPB 1000 mg/200 mL premix  Status:  Discontinued        1,000 mg 200 mL/hr over 60 Minutes Intravenous Every 12 hours 07/24/24 0644 07/24/24 0913   07/24/24 1000  cefTRIAXone  (ROCEPHIN ) 2 g in sodium chloride  0.9 % 100 mL IVPB  Status:  Discontinued        2 g 200 mL/hr over 30 Minutes Intravenous Every 12 hours 07/24/24 0622 07/24/24 0913   07/24/24 1000  cefTRIAXone  (ROCEPHIN ) 2 g in sodium chloride  0.9 % 100 mL IVPB        2 g 200 mL/hr over 30 Minutes Intravenous Every 24 hours 07/24/24 0913 07/29/24 0959   07/24/24 0800  ampicillin  (OMNIPEN) 2 g in sodium chloride  0.9 % 100 mL IVPB  Status:  Discontinued        2 g 300 mL/hr over  20 Minutes Intravenous Every 4 hours 07/24/24 0622 07/24/24 0913   07/24/24 0630  vancomycin  (VANCOREADY) IVPB 2000 mg/400 mL  Status:  Discontinued        2,000 mg 200 mL/hr over 120 Minutes Intravenous  Once 07/24/24 0631 07/24/24 0913        I have personally reviewed the following labs and images: CBC: Recent Labs  Lab 07/23/24 0940 07/24/24 0136 07/25/24 0357  WBC 12.2* 15.3* 14.2*  NEUTROABS 9.5*  --   --   HGB 15.0 15.5 14.9  HCT 44.9 45.5 46.9  MCV 78.4* 77.9* 82.7  PLT 424* 438* 354   BMP &GFR Recent Labs  Lab 07/23/24 0940 07/24/24 0136 07/24/24 1005 07/25/24 0357  NA 139 141  --  141  K 3.1* 3.3*  --  3.8  CL 96* 96*  --  103  CO2 25 25  --  22  GLUCOSE 114* 141*  --  120*  BUN 18 18  --  15  CREATININE 1.36* 1.48*  --  1.22  CALCIUM  10.2 10.1  --  8.7*  MG  --   --  2.4  --    Estimated Creatinine Clearance: 87.4 mL/min (by C-G formula based on SCr of 1.22 mg/dL). Liver & Pancreas: Recent Labs  Lab 07/23/24 0940  07/24/24 0136  AST 26 42*  ALT 17 20  ALKPHOS 85 81  BILITOT 0.7 0.6  PROT 9.3* 9.2*  ALBUMIN 5.3* 5.2*   Recent Labs  Lab 07/24/24 0136  LIPASE 24   Recent Labs  Lab 07/24/24 0614  AMMONIA <13   Diabetic: No results for input(s): HGBA1C in the last 72 hours. Recent Labs  Lab 07/24/24 0024  GLUCAP 142*   Cardiac Enzymes: Recent Labs  Lab 07/24/24 1005 07/25/24 0357  CKTOTAL 1,372* 770*   No results for input(s): PROBNP in the last 8760 hours. Coagulation Profile: Recent Labs  Lab 07/24/24 0614  INR 1.1   Thyroid  Function Tests: No results for input(s): TSH, T4TOTAL, FREET4, T3FREE, THYROIDAB in the last 72 hours. Lipid Profile: No results for input(s): CHOL, HDL, LDLCALC, TRIG, CHOLHDL, LDLDIRECT in the last 72 hours. Anemia Panel: No results for input(s): VITAMINB12, FOLATE, FERRITIN, TIBC, IRON, RETICCTPCT in the last 72 hours. Urine analysis:    Component Value Date/Time   COLORURINE YELLOW 07/24/2024 0637   APPEARANCEUR CLEAR 07/24/2024 0637   LABSPEC 1.015 07/24/2024 0637   PHURINE 5.5 07/24/2024 0637   GLUCOSEU NEGATIVE 07/24/2024 0637   HGBUR SMALL (A) 07/24/2024 0637   BILIRUBINUR NEGATIVE 07/24/2024 0637   KETONESUR 40 (A) 07/24/2024 0637   PROTEINUR >300 (A) 07/24/2024 0637   UROBILINOGEN 0.2 11/01/2012 1429   NITRITE NEGATIVE 07/24/2024 0637   LEUKOCYTESUR NEGATIVE 07/24/2024 0637   Sepsis Labs: Invalid input(s): PROCALCITONIN, LACTICIDVEN  Microbiology: Recent Results (from the past 240 hours)  Resp panel by RT-PCR (RSV, Flu A&B, Covid) Anterior Nasal Swab     Status: None   Collection Time: 07/24/24  2:37 AM   Specimen: Anterior Nasal Swab  Result Value Ref Range Status   SARS Coronavirus 2 by RT PCR NEGATIVE NEGATIVE Final   Influenza A by PCR NEGATIVE NEGATIVE Final   Influenza B by PCR NEGATIVE NEGATIVE Final    Comment: (NOTE) The Xpert Xpress SARS-CoV-2/FLU/RSV plus assay is intended  as an aid in the diagnosis of influenza from Nasopharyngeal swab specimens and should not be used as a sole basis for treatment. Nasal washings and aspirates are unacceptable for Xpert Xpress  SARS-CoV-2/FLU/RSV testing.  Fact Sheet for Patients: bloggercourse.com  Fact Sheet for Healthcare Providers: seriousbroker.it  This test is not yet approved or cleared by the [place]  FDA and has been authorized for detection and/or diagnosis of SARS-CoV-2 by FDA under an Emergency Use Authorization (EUA). This EUA will remain in effect (meaning this test can be used) for the duration of the COVID-19 declaration under Section 564(b)(1) of the Act, 21 U.S.C. section 360bbb-3(b)(1), unless the authorization is terminated or revoked.     Resp Syncytial Virus by PCR NEGATIVE NEGATIVE Final    Comment: (NOTE) Fact Sheet for Patients: bloggercourse.com  Fact Sheet for Healthcare Providers: seriousbroker.it  This test is not yet approved or cleared by the [place]  FDA and has been authorized for detection and/or diagnosis of SARS-CoV-2 by FDA under an Emergency Use Authorization (EUA). This EUA will remain in effect (meaning this test can be used) for the duration of the COVID-19 declaration under Section 564(b)(1) of the Act, 21 U.S.C. section 360bbb-3(b)(1), unless the authorization is terminated or revoked.  Performed at [facility], [address] [place], [place] [address]   Culture, blood (Routine X 2) w Reflex to ID Panel     Status: None (Preliminary result)   Collection Time: 07/24/24  5:30 AM   Specimen: BLOOD  Result Value Ref Range Status   Specimen Description BLOOD RIGHT ANTECUBITAL  Final   Special Requests   Final    BOTTLES DRAWN AEROBIC AND ANAEROBIC Blood Culture adequate volume   Culture   Final    NO GROWTH 2 DAYS Performed at [facility],  [address], [place], [place] [address]    Report Status PENDING  Incomplete  Culture, blood (Routine X 2) w Reflex to ID Panel     Status: None (Preliminary result)   Collection Time: 07/24/24  6:43 AM   Specimen: BLOOD  Result Value Ref Range Status   Specimen Description BLOOD LEFT ANTECUBITAL  Final   Special Requests   Final    BOTTLES DRAWN AEROBIC AND ANAEROBIC Blood Culture results may not be optimal due to an inadequate volume of blood received in culture bottles   Culture   Final    NO GROWTH 2 DAYS Performed at [facility], [address] [place], [place] [address]    Report Status PENDING  Incomplete  MRSA Next Gen by PCR, Nasal     Status: None   Collection Time: 07/24/24  7:00 AM   Specimen: Nasal Mucosa; Nasal Swab  Result Value Ref Range Status   MRSA by PCR Next Gen NOT DETECTED NOT DETECTED Final    Comment: (NOTE) The GeneXpert MRSA Assay (FDA approved for NASAL specimens only), is one component of a comprehensive MRSA colonization surveillance program. It is not intended to diagnose MRSA infection nor to guide or monitor treatment for MRSA infections. Test performance is not FDA approved in patients less than 3 years old. Performed at [facility], [address], [place], [place] [address]     Radiology Studies: No results found.    Janeann Paisley T. Dasan Hardman [facility]  If 7PM-7AM, please contact night-coverage [email or web address] 07/26/2024, 12:26 PM   "

## 2024-07-26 NOTE — Plan of Care (Signed)

## 2024-07-26 NOTE — TOC Initial Note (Signed)
 Transition of Care [facility]) - Initial/Assessment Note    Patient Details  Name: Anthony Wiggins MRN: [id number] Date of Birth: 1985-07-08  [facility] CM/SW Contact:    Arlana JINNY Nicholaus ISRAEL Phone Number: [phone] 07/26/2024, 12:34 PM  Clinical Narrative:  CSW called and spoke with the patients mother, Anthony Wiggins (legal guardian). Anthony Wiggins stated that the patient lives with his girlfriend. Patient does not drive or work. Patient has no history of HH services. Patient does not use any DME. Patient has no PCP. Patient uses [facility] [address].   Unit CSW/CM will continue to follow and monitor for dc readiness.                        Patient Goals and CMS Choice            Expected Discharge Plan and Services                                              Prior Living Arrangements/Services                       Activities of Daily Living      Permission Sought/Granted                  Emotional Assessment              Admission diagnosis:  Altered mental status [R41.82] Altered mental status, unspecified altered mental status type [R41.82] Patient Active Problem List   Diagnosis Date Noted   Altered mental status 07/24/2024   Fentanyl  use disorder, moderate, dependence (HCC) 07/24/2024   Psychoactive substance-induced psychosis (HCC) 05/07/2020   Tobacco use disorder, moderate, dependence 05/07/2020   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/07/2020   Methamphetamine abuse (HCC) 05/07/2020   PCP:  Patient, No Pcp Per Pharmacy:  No Pharmacies Listed    Social Drivers of Health (SDOH) Social History: SDOH Screenings   Food Insecurity: No Food Insecurity (07/25/2024)  Housing: Low Risk (07/25/2024)  Transportation Needs: No Transportation Needs (07/25/2024)  Utilities: Not At Risk (07/25/2024)  Depression (PHQ2-9): Low Risk (06/10/2022)  Tobacco Use: High Risk (07/23/2024)   SDOH  Interventions:     Readmission Risk Interventions    09/24/2022    4:37 PM  Readmission Risk Prevention Plan  Transportation Screening Complete  Medication Review (RN Care Manager) Complete  PCP or Specialist appointment within 3-5 days of discharge Complete  HRI or Home Care Consult Complete  Palliative Care Screening Not Applicable  Skilled Nursing Facility Not Applicable

## 2024-07-27 ENCOUNTER — Other Ambulatory Visit (HOSPITAL_COMMUNITY): Payer: Self-pay

## 2024-07-27 DIAGNOSIS — F1120 Opioid dependence, uncomplicated: Secondary | ICD-10-CM | POA: Diagnosis not present

## 2024-07-27 DIAGNOSIS — F19959 Other psychoactive substance use, unspecified with psychoactive substance-induced psychotic disorder, unspecified: Secondary | ICD-10-CM | POA: Diagnosis not present

## 2024-07-27 DIAGNOSIS — R400 Somnolence: Secondary | ICD-10-CM | POA: Diagnosis not present

## 2024-07-27 LAB — COMPREHENSIVE METABOLIC PANEL WITH GFR
ALT: 15 U/L (ref 0–44)
AST: 24 U/L (ref 15–41)
Albumin: 4.1 g/dL (ref 3.5–5.0)
Alkaline Phosphatase: 64 U/L (ref 38–126)
Anion gap: 15 (ref 5–15)
BUN: 13 mg/dL (ref 6–20)
CO2: 25 mmol/L (ref 22–32)
Calcium: 8.9 mg/dL (ref 8.9–10.3)
Chloride: 97 mmol/L — ABNORMAL LOW (ref 98–111)
Creatinine, Ser: 1.09 mg/dL (ref 0.61–1.24)
GFR, Estimated: >60 mL/min
Glucose, Bld: 104 mg/dL — ABNORMAL HIGH (ref 70–99)
Potassium: 3.5 mmol/L (ref 3.5–5.1)
Sodium: 137 mmol/L (ref 135–145)
Total Bilirubin: 0.6 mg/dL (ref 0.0–1.2)
Total Protein: 7 g/dL (ref 6.5–8.1)

## 2024-07-27 LAB — CBC
HCT: 41.5 % (ref 39.0–52.0)
Hemoglobin: 14 g/dL (ref 13.0–17.0)
MCH: 26.4 pg (ref 26.0–34.0)
MCHC: 33.7 g/dL (ref 30.0–36.0)
MCV: 78.3 fL — ABNORMAL LOW (ref 80.0–100.0)
Platelets: 336 10*3/uL (ref 150–400)
RBC: 5.3 MIL/uL (ref 4.22–5.81)
RDW: 13 % (ref 11.5–15.5)
WBC: 9.6 10*3/uL (ref 4.0–10.5)
nRBC: 0 % (ref 0.0–0.2)

## 2024-07-27 LAB — CK: Total CK: 661 U/L — ABNORMAL HIGH (ref 49–397)

## 2024-07-27 LAB — MAGNESIUM: Magnesium: 2.4 mg/dL (ref 1.7–2.4)

## 2024-07-27 LAB — T.PALLIDUM AB, TOTAL: T Pallidum Abs: NONREACTIVE

## 2024-07-27 MED ORDER — ASPIRIN 81 MG PO TBEC: 81.0000 mg | DELAYED_RELEASE_TABLET | Freq: Every day | ORAL | Status: AC

## 2024-07-27 MED ORDER — CARVEDILOL 12.5 MG PO TABS
12.5000 mg | ORAL_TABLET | Freq: Two times a day (BID) | ORAL | 0 refills | Status: AC
  Filled 2024-07-27: qty 120, 60d supply, fill #0

## 2024-07-27 MED ORDER — ATORVASTATIN CALCIUM 40 MG PO TABS
40.0000 mg | ORAL_TABLET | Freq: Every day | ORAL | 0 refills | Status: AC
  Filled 2024-07-27: qty 90, 90d supply, fill #0

## 2024-07-27 MED ORDER — FOLIC ACID 1 MG PO TABS
1.0000 mg | ORAL_TABLET | Freq: Every day | ORAL | 0 refills | Status: AC
  Filled 2024-07-27: qty 60, 60d supply, fill #0

## 2024-07-27 MED ORDER — THIAMINE HCL 100 MG PO TABS
200.0000 mg | ORAL_TABLET | Freq: Every day | ORAL | 0 refills | Status: AC
  Filled 2024-07-27: qty 180, 90d supply, fill #0

## 2024-07-27 MED ORDER — CARVEDILOL 12.5 MG PO TABS
12.5000 mg | ORAL_TABLET | Freq: Two times a day (BID) | ORAL | Status: DC
  Administered 2024-07-27: 12.5 mg via ORAL
  Filled 2024-07-27: qty 1

## 2024-07-27 MED ORDER — ADULT MULTIVITAMIN W/MINERALS CH: 1.0000 | ORAL_TABLET | Freq: Every day | ORAL | Status: AC

## 2024-07-27 MED ORDER — AMLODIPINE BESYLATE 10 MG PO TABS
10.0000 mg | ORAL_TABLET | Freq: Every day | ORAL | 0 refills | Status: AC
  Filled 2024-07-27: qty 90, 90d supply, fill #0

## 2024-07-27 MED ORDER — OLANZAPINE 5 MG PO TABS
5.0000 mg | ORAL_TABLET | Freq: Every day | ORAL | 0 refills | Status: AC
  Filled 2024-07-27: qty 60, 60d supply, fill #0

## 2024-07-27 MED ORDER — NICOTINE 21 MG/24HR TD PT24: 21.0000 mg | MEDICATED_PATCH | Freq: Every day | TRANSDERMAL | Status: AC

## 2024-07-27 NOTE — TOC Progression Note (Signed)
 Transition of Care [facility]) - Progression Note    Patient Details  Name: Anthony Wiggins MRN: [id number] Date of Birth: 02/10/1986  [facility]) CM/SW Contact  Rosaline JONELLE Joe, RN Phone Number: 07/27/2024, 9:32 AM  Clinical Narrative:    Patient admitted to the hospital for Acute toxic encephalopathy in the setting of Fentanyl  use.  Patient lives with girlfriend and will likely discharge home when medically stable.  Resources for OP/IP Substance abuse counseling provided in the AVS.  PCP follow up noted in the AVS.                     Expected Discharge Plan and Services                                               Social Drivers of Health (SDOH) Interventions SDOH Screenings   Food Insecurity: No Food Insecurity (07/25/2024)  Housing: Low Risk (07/25/2024)  Transportation Needs: No Transportation Needs (07/25/2024)  Utilities: Not At Risk (07/25/2024)  Depression (PHQ2-9): Low Risk (06/10/2022)  Tobacco Use: High Risk (07/23/2024)    Readmission Risk Interventions    09/24/2022    4:37 PM  Readmission Risk Prevention Plan  Transportation Screening Complete  Medication Review (RN Care Manager) Complete  PCP or Specialist appointment within 3-5 days of discharge Complete  HRI or Home Care Consult Complete  Palliative Care Screening Not Applicable  Skilled Nursing Facility Not Applicable

## 2024-07-29 LAB — CULTURE, BLOOD (ROUTINE X 2)
Culture: NO GROWTH
Culture: NO GROWTH
Special Requests: ADEQUATE
